# Patient Record
Sex: Female | Born: 1950 | Race: White | Hispanic: No | Marital: Married | Smoking: Current every day smoker
Health system: Southern US, Community
[De-identification: ages and names within clinical notes are randomized; demographics above are authoritative.]

## PROBLEM LIST (undated history)

## (undated) DIAGNOSIS — K589 Irritable bowel syndrome without diarrhea: Secondary | ICD-10-CM

## (undated) DIAGNOSIS — F32A Depression, unspecified: Secondary | ICD-10-CM

## (undated) DIAGNOSIS — T8859XA Other complications of anesthesia, initial encounter: Secondary | ICD-10-CM

## (undated) DIAGNOSIS — F329 Major depressive disorder, single episode, unspecified: Secondary | ICD-10-CM

## (undated) DIAGNOSIS — Z79890 Hormone replacement therapy (postmenopausal): Secondary | ICD-10-CM

## (undated) DIAGNOSIS — N950 Postmenopausal bleeding: Secondary | ICD-10-CM

## (undated) DIAGNOSIS — R87629 Unspecified abnormal cytological findings in specimens from vagina: Secondary | ICD-10-CM

## (undated) DIAGNOSIS — T4145XA Adverse effect of unspecified anesthetic, initial encounter: Secondary | ICD-10-CM

## (undated) DIAGNOSIS — K449 Diaphragmatic hernia without obstruction or gangrene: Secondary | ICD-10-CM

## (undated) DIAGNOSIS — R9389 Abnormal findings on diagnostic imaging of other specified body structures: Secondary | ICD-10-CM

## (undated) DIAGNOSIS — C4490 Unspecified malignant neoplasm of skin, unspecified: Secondary | ICD-10-CM

## (undated) HISTORY — DX: Postmenopausal bleeding: N95.0

## (undated) HISTORY — PX: TUBAL LIGATION: SHX77

## (undated) HISTORY — DX: Unspecified abnormal cytological findings in specimens from vagina: R87.629

## (undated) HISTORY — DX: Hormone replacement therapy: Z79.890

## (undated) HISTORY — DX: Unspecified malignant neoplasm of skin, unspecified: C44.90

## (undated) HISTORY — PX: BREAST ENHANCEMENT SURGERY: SHX7

## (undated) HISTORY — DX: Depression, unspecified: F32.A

## (undated) HISTORY — DX: Abnormal findings on diagnostic imaging of other specified body structures: R93.89

## (undated) HISTORY — DX: Major depressive disorder, single episode, unspecified: F32.9

## (undated) HISTORY — PX: CERVICAL BIOPSY: SHX590

---

## 2009-03-27 ENCOUNTER — Ambulatory Visit (HOSPITAL_COMMUNITY): Admission: RE | Admit: 2009-03-27 | Discharge: 2009-03-27 | Payer: Self-pay | Admitting: Internal Medicine

## 2009-07-08 ENCOUNTER — Other Ambulatory Visit: Admission: RE | Admit: 2009-07-08 | Discharge: 2009-07-08 | Payer: Self-pay | Admitting: Obstetrics and Gynecology

## 2009-09-19 HISTORY — PX: CHOLECYSTECTOMY: SHX55

## 2010-04-13 ENCOUNTER — Ambulatory Visit (HOSPITAL_COMMUNITY): Admission: RE | Admit: 2010-04-13 | Discharge: 2010-04-13 | Payer: Self-pay | Admitting: Obstetrics & Gynecology

## 2010-06-08 ENCOUNTER — Ambulatory Visit (HOSPITAL_COMMUNITY): Admission: RE | Admit: 2010-06-08 | Discharge: 2010-06-08 | Payer: Self-pay | Admitting: Internal Medicine

## 2010-06-18 ENCOUNTER — Ambulatory Visit (HOSPITAL_COMMUNITY): Admission: RE | Admit: 2010-06-18 | Discharge: 2010-06-18 | Payer: Self-pay | Admitting: General Surgery

## 2010-12-02 LAB — HEPATIC FUNCTION PANEL
ALT: 12 U/L (ref 0–35)
Total Bilirubin: 0.6 mg/dL (ref 0.3–1.2)
Total Protein: 6.7 g/dL (ref 6.0–8.3)

## 2010-12-02 LAB — BASIC METABOLIC PANEL
Calcium: 9.3 mg/dL (ref 8.4–10.5)
Chloride: 107 mEq/L (ref 96–112)
Creatinine, Ser: 0.95 mg/dL (ref 0.4–1.2)
GFR calc Af Amer: >60 mL/min (ref >60)
Glucose, Bld: 78 mg/dL (ref 70–99)
Sodium: 138 mEq/L (ref 135–145)

## 2010-12-02 LAB — CBC
Hemoglobin: 13.2 g/dL (ref 12.0–15.0)
MCHC: 33.9 g/dL (ref 30.0–36.0)
RBC: 4.56 MIL/uL (ref 3.87–5.11)
WBC: 6.8 10*3/uL (ref 4.0–10.5)

## 2010-12-02 LAB — SURGICAL PCR SCREEN: Staphylococcus aureus: NEGATIVE

## 2011-05-26 ENCOUNTER — Other Ambulatory Visit (HOSPITAL_COMMUNITY): Payer: Self-pay | Admitting: Internal Medicine

## 2011-05-26 DIAGNOSIS — Z139 Encounter for screening, unspecified: Secondary | ICD-10-CM

## 2011-06-06 ENCOUNTER — Ambulatory Visit (HOSPITAL_COMMUNITY)
Admission: RE | Admit: 2011-06-06 | Discharge: 2011-06-06 | Disposition: A | Discharge status: Home / Self Care | Source: Ambulatory Visit | Attending: Internal Medicine | Admitting: Internal Medicine

## 2011-06-06 DIAGNOSIS — Z139 Encounter for screening, unspecified: Secondary | ICD-10-CM

## 2011-06-06 DIAGNOSIS — Z1231 Encounter for screening mammogram for malignant neoplasm of breast: Secondary | ICD-10-CM | POA: Insufficient documentation

## 2011-07-13 ENCOUNTER — Other Ambulatory Visit: Payer: Self-pay | Admitting: Adult Health

## 2011-07-13 ENCOUNTER — Other Ambulatory Visit (HOSPITAL_COMMUNITY)
Admission: RE | Admit: 2011-07-13 | Discharge: 2011-07-13 | Disposition: A | Discharge status: Home / Self Care | Source: Ambulatory Visit | Attending: Obstetrics and Gynecology | Admitting: Obstetrics and Gynecology

## 2011-07-13 DIAGNOSIS — Z01419 Encounter for gynecological examination (general) (routine) without abnormal findings: Secondary | ICD-10-CM | POA: Insufficient documentation

## 2013-04-12 ENCOUNTER — Encounter (INDEPENDENT_AMBULATORY_CARE_PROVIDER_SITE_OTHER): Payer: Self-pay | Admitting: *Deleted

## 2013-04-15 ENCOUNTER — Ambulatory Visit (INDEPENDENT_AMBULATORY_CARE_PROVIDER_SITE_OTHER): Admitting: Internal Medicine

## 2013-04-15 ENCOUNTER — Encounter (INDEPENDENT_AMBULATORY_CARE_PROVIDER_SITE_OTHER): Payer: Self-pay | Admitting: Internal Medicine

## 2013-04-15 VITALS — BP 94/60 | HR 84 | Temp 99.7°F | Ht 64.0 in | Wt 118.6 lb

## 2013-04-15 DIAGNOSIS — R634 Abnormal weight loss: Secondary | ICD-10-CM | POA: Insufficient documentation

## 2013-04-15 DIAGNOSIS — C4490 Unspecified malignant neoplasm of skin, unspecified: Secondary | ICD-10-CM | POA: Insufficient documentation

## 2013-04-15 DIAGNOSIS — K529 Noninfective gastroenteritis and colitis, unspecified: Secondary | ICD-10-CM | POA: Insufficient documentation

## 2013-04-15 DIAGNOSIS — R197 Diarrhea, unspecified: Secondary | ICD-10-CM

## 2013-04-15 NOTE — Progress Notes (Signed)
Subjective:     Patient ID: Morgan Fields, female   DOB: Feb 08, 1951, 62 y.o.   MRN: [id number]  HPI Referred to our office by Dr. Timothy Lasso for weight loss and severe diarrhea.  She tells me her diarrhea with the past 2 years. No studies studies. She has diarrhea 6-8 times in the morning. Until she was put on Lomotil, her stools were loose. Now her stools are soft and formed. She has had 5 stools this morning. Appetite is not good. She has nausea with a meal. She tells me she has lost 30 pounds over the past 2 yrs. She has lost 10 pounds over the past 6 months. She occasionally has rt upper quadrant pain.  Presently taking ? Antibiotic for a biopsy on posterior rt foot No melena or rectal bleeding Grandmother died of colon cancer at age 76.   Colonoscopy 06/29/2005 Dr. Cleotis Nipper;  No diverticulosis. No colonic or rectal polyps. Reductant colon  EGD/913/2011 Dr. Karilyn Cota:  (biopsy of a swollen antral fold). Chronic gastritis and edema. 06/08/2010 US abdomen: IMPRESSION:  1. Cholelithiasis without sonographic findings for acute  cholecystitis.  2. Normal caliber common bile duct.  3. The remainder of the examination is unremarkable.   06/18/2010: Lap. Cholecystectomy Her last colonoscopy was 8 yrs ago by Dr. Cleotis Nipper in [place]. Normal exam.   Review of Systems     Objective:   Physical Exam  Filed Vitals:   04/15/13 0925  BP: 94/60  Pulse: 84  Temp: 99.7 F (37.6 C)  Height: 5\' 4"  (1.626 m)  Weight: 118 lb 9.6 oz (53.797 kg)   Alert and oriented. Skin warm and dry. Oral mucosa is moist.   . Sclera anicteric, conjunctivae is pink. Thyroid not enlarged. No cervical lymphadenopathy. Lungs clear. Heart regular rate and rhythm.  Abdomen is soft. Bowel sounds are positive. No hepatomegaly. No abdominal masses felt. No tenderness.  No edema to lower extremities. Patient is alert and oriented.      Assessment:    Diarrhea. ? Post gallbladder surgery. Possible IBS  Weight loss. Colonic  neoplasm needs to be ruled. Last colonoscopy 8 yrs ago by Dr. Cleotis Nipper which was normal.    Plan:    Colonoscopy by Dr. Karilyn Cota. Imodium BID

## 2013-04-15 NOTE — Patient Instructions (Addendum)
Colonoscopy with Dr. Karilyn Cota. Imodium BID

## 2013-04-17 ENCOUNTER — Other Ambulatory Visit (INDEPENDENT_AMBULATORY_CARE_PROVIDER_SITE_OTHER): Payer: Self-pay | Admitting: *Deleted

## 2013-04-17 ENCOUNTER — Telehealth (INDEPENDENT_AMBULATORY_CARE_PROVIDER_SITE_OTHER): Payer: Self-pay | Admitting: *Deleted

## 2013-04-17 DIAGNOSIS — R197 Diarrhea, unspecified: Secondary | ICD-10-CM

## 2013-04-17 DIAGNOSIS — Z1211 Encounter for screening for malignant neoplasm of colon: Secondary | ICD-10-CM

## 2013-04-17 DIAGNOSIS — R634 Abnormal weight loss: Secondary | ICD-10-CM

## 2013-04-17 NOTE — Telephone Encounter (Signed)
Patient needs movi prep 

## 2013-04-19 MED ORDER — PEG-KCL-NACL-NASULF-NA ASC-C 100 G PO SOLR: 1.0000 | Freq: Once | ORAL | Status: DC

## 2013-04-22 ENCOUNTER — Encounter (HOSPITAL_COMMUNITY): Payer: Self-pay | Admitting: Pharmacy Technician

## 2013-05-06 ENCOUNTER — Encounter (INDEPENDENT_AMBULATORY_CARE_PROVIDER_SITE_OTHER): Payer: Self-pay

## 2013-05-08 ENCOUNTER — Encounter (HOSPITAL_COMMUNITY): Payer: Self-pay | Admitting: *Deleted

## 2013-05-08 ENCOUNTER — Encounter (HOSPITAL_COMMUNITY)
Admission: RE | Disposition: A | Discharge status: Home / Self Care | Payer: Self-pay | Source: Ambulatory Visit | Attending: Internal Medicine

## 2013-05-08 ENCOUNTER — Ambulatory Visit (HOSPITAL_COMMUNITY)
Admission: RE | Admit: 2013-05-08 | Discharge: 2013-05-08 | Disposition: A | Discharge status: Home / Self Care | Source: Ambulatory Visit | Attending: Internal Medicine | Admitting: Internal Medicine

## 2013-05-08 DIAGNOSIS — K589 Irritable bowel syndrome without diarrhea: Secondary | ICD-10-CM | POA: Insufficient documentation

## 2013-05-08 DIAGNOSIS — K5730 Diverticulosis of large intestine without perforation or abscess without bleeding: Secondary | ICD-10-CM

## 2013-05-08 DIAGNOSIS — R197 Diarrhea, unspecified: Secondary | ICD-10-CM

## 2013-05-08 DIAGNOSIS — Z85828 Personal history of other malignant neoplasm of skin: Secondary | ICD-10-CM | POA: Insufficient documentation

## 2013-05-08 DIAGNOSIS — R634 Abnormal weight loss: Secondary | ICD-10-CM

## 2013-05-08 DIAGNOSIS — Z800 Family history of malignant neoplasm of digestive organs: Secondary | ICD-10-CM | POA: Insufficient documentation

## 2013-05-08 HISTORY — PX: COLONOSCOPY: SHX5424

## 2013-05-08 SURGERY — COLONOSCOPY
Anesthesia: Moderate Sedation

## 2013-05-08 MED ORDER — MEPERIDINE HCL 50 MG/ML IJ SOLN
INTRAMUSCULAR | Status: AC
  Filled 2013-05-08: qty 1

## 2013-05-08 MED ORDER — MEPERIDINE HCL 50 MG/ML IJ SOLN
INTRAMUSCULAR | Status: DC | PRN
  Administered 2013-05-08: 10 mg via INTRAVENOUS
  Administered 2013-05-08: 25 mg via INTRAVENOUS
  Administered 2013-05-08: 15 mg via INTRAVENOUS

## 2013-05-08 MED ORDER — MIDAZOLAM HCL 5 MG/5ML IJ SOLN
INTRAMUSCULAR | Status: AC
  Filled 2013-05-08: qty 10

## 2013-05-08 MED ORDER — DICYCLOMINE HCL 10 MG PO CAPS: 10.0000 mg | ORAL_CAPSULE | Freq: Three times a day (TID) | ORAL | Status: DC

## 2013-05-08 MED ORDER — MIDAZOLAM HCL 5 MG/5ML IJ SOLN
INTRAMUSCULAR | Status: DC | PRN
  Administered 2013-05-08: 1 mg via INTRAVENOUS
  Administered 2013-05-08: 2 mg via INTRAVENOUS
  Administered 2013-05-08: 1 mg via INTRAVENOUS
  Administered 2013-05-08 (×2): 2 mg via INTRAVENOUS

## 2013-05-08 MED ORDER — SODIUM CHLORIDE 0.9 % IV SOLN
INTRAVENOUS | Status: DC
  Administered 2013-05-08: 1000 mL via INTRAVENOUS

## 2013-05-08 NOTE — Op Note (Addendum)
COLONOSCOPY PROCEDURE REPORT  PATIENT:  Morgan Fields  MR#:  [id number] Birthdate:  Dec 19, 1950, 62 y.o., female Endoscopist:  Dr. Malissa Hippo, MD Referred By:  Dr. Dwana Melena, MD Procedure Date: 05/08/2013  Procedure:   Colonoscopy  Indications:  Patient is 63 year-old Caucasian female who  presents with chronic diarrhea and 15 pound weight loss.He has history of IBS or present symptoms are different than what she used to have.  Informed Consent:  The procedure and risks were reviewed with the patient and informed consent was obtained.  Medications:  Demerol 50 mg IV Versed 8 mg IV  Description of procedure:  After a digital rectal exam was performed, that colonoscope was advanced from the anus through the rectum and colon to the area of the cecum, ileocecal valve and appendiceal orifice. The cecum was deeply intubated. These structures were well-seen and photographed for the record. From the level of the cecum and ileocecal valve, the scope was slowly and cautiously withdrawn. The mucosal surfaces were carefully surveyed utilizing scope tip to flexion to facilitate fold flattening as needed. The scope was pulled down into the rectum where a thorough exam including retroflexion was performed. Terminal ileum was also examined.  Findings:  Prep excellent. Normal mucosa of terminal ileum. Few small diverticula at sigmoid colon. Mucosa of varius egments of colon was normal. Normal rectal mucosa and anal rectal junction. Random biopsies taken for mucosa of sigmoid colon for routine histology.    Therapeutic/Diagnostic Maneuvers Performed:  See above  Complications:  None  Cecal Withdrawal Time:  9 minutes  Impression:  Normal mucosa of terminal ileum. Few small diverticula at sigmoid colon otherwise normal colonoscopy. Random biopsies taken for mucosa of sigmoid colon looking for microscopic or collagenous colitis.  Recommendations:  Standard instructions  given. Dicyclomine 10 mg by mouth a.c. I will contact patient with biopsy results and further recommendations.  Sheron Tallman U  05/08/2013 3:32 PM  CC: Dr. Catalina Pizza, MD & Dr. Bonnetta Barry ref. provider found

## 2013-05-08 NOTE — H&P (Signed)
Morgan Fields is an 62 y.o. female.   Chief Complaint: Patient's here for colonoscopy. HPI: Patient is 40-year-old Caucasian female who presents with 6 month history of diarrhea. On worst that she is having as many as 8-10 stools per day. She had explosive bowel movements and urgency. She denies abdominal pain or rectal bleeding. She also has noted loss of appetite nausea and 15 pound weight loss in the last 6 months. She has history of IBS he has been doing well without any therapy until these symptoms started. Since last colonoscopy was in 2006. History is significant for colon carcinoma in maternal grandmother who was in her 96s at the time of diagnosis.  Past Medical History  Diagnosis Date  . Skin cancer     Recent to anterior chest.     Past Surgical History  Procedure Laterality Date  . Cholecystectomy  2011  . Tubal ligation    . Cervical biopsy      precancerous years ago  . Breast enhancement surgery      History reviewed. No pertinent family history. Social History:  reports that she has been smoking.  She does not have any smokeless tobacco history on file. She reports that  drinks alcohol. She reports that she does not use illicit drugs.  Allergies: No Known Allergies  Medications Prior to Admission  Medication Sig Dispense Refill  . Loperamide HCl (IMODIUM A-D) 1 MG/7.5ML LIQD Take 15 mLs by mouth daily as needed (diarrhea).      . Probiotic Product (ALIGN) 4 MG CAPS Take by mouth.        No results found for this or any previous visit (from the past 48 hour(s)). No results found.  ROS  Blood pressure 131/74, pulse 82, temperature 98.5 F (36.9 C), temperature source Oral, resp. rate 17, height 5\' 4"  (1.626 m), weight 117 lb (53.071 kg), SpO2 94.00%. Physical Exam  Constitutional:  Well-developed thin Caucasian female in NAD  HENT:  Mouth/Throat: Oropharynx is clear and moist.  Eyes: Conjunctivae are normal. No scleral icterus.  Neck: No thyromegaly  present.  Cardiovascular: Normal rate, regular rhythm and normal heart sounds.   No murmur heard. Respiratory: Effort normal and breath sounds normal.  GI: Soft. She exhibits no distension and no mass. There is no tenderness.  Musculoskeletal: She exhibits no edema.  Lymphadenopathy:    She has no cervical adenopathy.  Neurological: She is alert.  Skin: Skin is warm and dry.  She has tattoo over right scapular area     Assessment/Plan Chronic diarrhea and weight loss. Diagnostic colonoscopy.  Nyheem Binette U 05/08/2013, 2:49 PM

## 2013-05-09 ENCOUNTER — Encounter (HOSPITAL_COMMUNITY): Payer: Self-pay | Admitting: Internal Medicine

## 2013-05-27 ENCOUNTER — Encounter (INDEPENDENT_AMBULATORY_CARE_PROVIDER_SITE_OTHER): Payer: Self-pay | Admitting: *Deleted

## 2013-05-27 ENCOUNTER — Telehealth (INDEPENDENT_AMBULATORY_CARE_PROVIDER_SITE_OTHER): Payer: Self-pay | Admitting: *Deleted

## 2013-05-27 DIAGNOSIS — R197 Diarrhea, unspecified: Secondary | ICD-10-CM

## 2013-05-27 DIAGNOSIS — R634 Abnormal weight loss: Secondary | ICD-10-CM

## 2013-05-27 NOTE — Telephone Encounter (Signed)
Per Dr.Rehman the patient will need to have labs drawn. 

## 2013-07-02 ENCOUNTER — Other Ambulatory Visit (HOSPITAL_COMMUNITY): Payer: Self-pay | Admitting: Internal Medicine

## 2013-07-02 DIAGNOSIS — Z139 Encounter for screening, unspecified: Secondary | ICD-10-CM

## 2013-07-09 ENCOUNTER — Other Ambulatory Visit (HOSPITAL_COMMUNITY): Payer: Self-pay | Admitting: Internal Medicine

## 2013-07-09 ENCOUNTER — Ambulatory Visit (HOSPITAL_COMMUNITY)
Admission: RE | Admit: 2013-07-09 | Discharge: 2013-07-09 | Disposition: A | Discharge status: Home / Self Care | Source: Ambulatory Visit | Attending: Internal Medicine | Admitting: Internal Medicine

## 2013-07-09 DIAGNOSIS — Z139 Encounter for screening, unspecified: Secondary | ICD-10-CM

## 2013-07-09 DIAGNOSIS — Z1231 Encounter for screening mammogram for malignant neoplasm of breast: Secondary | ICD-10-CM | POA: Insufficient documentation

## 2013-10-30 ENCOUNTER — Other Ambulatory Visit (HOSPITAL_COMMUNITY): Payer: Self-pay | Admitting: Internal Medicine

## 2013-10-30 ENCOUNTER — Ambulatory Visit (HOSPITAL_COMMUNITY)
Admission: RE | Admit: 2013-10-30 | Discharge: 2013-10-30 | Disposition: A | Discharge status: Home / Self Care | Source: Ambulatory Visit | Attending: Internal Medicine | Admitting: Internal Medicine

## 2013-10-30 DIAGNOSIS — Z87891 Personal history of nicotine dependence: Secondary | ICD-10-CM

## 2013-10-30 DIAGNOSIS — F17200 Nicotine dependence, unspecified, uncomplicated: Secondary | ICD-10-CM | POA: Insufficient documentation

## 2013-10-30 DIAGNOSIS — N644 Mastodynia: Secondary | ICD-10-CM

## 2013-10-30 DIAGNOSIS — R634 Abnormal weight loss: Secondary | ICD-10-CM

## 2013-10-30 DIAGNOSIS — R079 Chest pain, unspecified: Secondary | ICD-10-CM | POA: Insufficient documentation

## 2013-10-30 DIAGNOSIS — M47814 Spondylosis without myelopathy or radiculopathy, thoracic region: Secondary | ICD-10-CM | POA: Insufficient documentation

## 2014-08-06 ENCOUNTER — Other Ambulatory Visit (HOSPITAL_COMMUNITY): Payer: Self-pay | Admitting: Internal Medicine

## 2014-08-06 DIAGNOSIS — Z1231 Encounter for screening mammogram for malignant neoplasm of breast: Secondary | ICD-10-CM

## 2014-08-10 ENCOUNTER — Emergency Department (HOSPITAL_COMMUNITY)
Admission: EM | Admit: 2014-08-10 | Discharge: 2014-08-10 | Disposition: A | Discharge status: Home / Self Care | Payer: TRICARE For Life (TFL) | Attending: Emergency Medicine | Admitting: Emergency Medicine

## 2014-08-10 ENCOUNTER — Encounter (HOSPITAL_COMMUNITY): Payer: Self-pay | Admitting: Emergency Medicine

## 2014-08-10 DIAGNOSIS — H01003 Unspecified blepharitis right eye, unspecified eyelid: Secondary | ICD-10-CM | POA: Diagnosis not present

## 2014-08-10 DIAGNOSIS — Z792 Long term (current) use of antibiotics: Secondary | ICD-10-CM | POA: Diagnosis not present

## 2014-08-10 DIAGNOSIS — H00011 Hordeolum externum right upper eyelid: Secondary | ICD-10-CM | POA: Diagnosis not present

## 2014-08-10 DIAGNOSIS — H539 Unspecified visual disturbance: Secondary | ICD-10-CM | POA: Diagnosis present

## 2014-08-10 DIAGNOSIS — H00013 Hordeolum externum right eye, unspecified eyelid: Secondary | ICD-10-CM

## 2014-08-10 MED ORDER — KETOROLAC TROMETHAMINE 0.5 % OP SOLN
1.0000 [drp] | Freq: Once | OPHTHALMIC | Status: AC
  Administered 2014-08-10: 1 [drp] via OPHTHALMIC
  Filled 2014-08-10: qty 5

## 2014-08-10 MED ORDER — ERYTHROMYCIN 5 MG/GM OP OINT
TOPICAL_OINTMENT | Freq: Once | OPHTHALMIC | Status: AC
  Administered 2014-08-10: 19:00:00 via OPHTHALMIC
  Filled 2014-08-10: qty 3.5

## 2014-08-10 NOTE — ED Notes (Signed)
Patient reports feeling irritation to right eyelid Monday that progressively got worse. Patient now has redness and swelling to eyelid. Reports using warm and cold compresses with no relief. Patient also using over counter eye drops with only a little relief. Patient does report clear drainage. Denies any blurred vision.

## 2014-08-10 NOTE — ED Provider Notes (Signed)
CSN: [id number]     Arrival date & time 08/10/14  1723 History  This chart was scribed for Morgan Jefferson, PA-C with Maudry Diego, MD by Edison Simon, ED Scribe. This patient was seen in room APFT21/[facility] and the patient's care was started at 6:22 PM.    Chief Complaint  Patient presents with  . Belepharitis   The history is provided by the patient. No language interpreter was used.    HPI Comments: Morgan Fields is a 63 y.o. female who presents to the Emergency Department complaining of right upper eyelid irritation, redness, and swelling with gradual onset since 6 days ago. She states she was at yoga when she stood up and started to feel a "heaviness" to her right eye. She states she saw her PCP 4 days ago who thought she had pink eye and prescribed her Naphcon drops; she states the drops make it feel better momentarily, but then has recurrent pressure and pain. She also reports using warm compresses, the switched to cold compresses after which the swelling enlarged. She states swelling and pain continued to increase and spread. She reports some blurry vision. She denies redness in the eye itself. She has woke with crusty eyelid margins and difficulty opening her eye.  Past Medical History  Diagnosis Date  . Skin cancer     Recent to anterior chest.    Past Surgical History  Procedure Laterality Date  . Cholecystectomy  2011  . Tubal ligation    . Cervical biopsy      precancerous years ago  . Breast enhancement surgery    . Colonoscopy N/A 05/08/2013    Procedure: COLONOSCOPY;  Surgeon: Rogene Houston, MD;  Location: [facility];  Service: Endoscopy;  Laterality: N/A;  200   Family History  Problem Relation Age of Onset  . Diabetes Other   . Heart attack Brother    History  Substance Use Topics  . Smoking status: Current Every Day Smoker -- 0.10 packs/day for 24 years    Types: Cigarettes  . Smokeless tobacco: Never Used  . Alcohol Use: 7.2 oz/week    12 Cans of beer  per week   OB History    Gravida Para Term Preterm AB TAB SAB Ectopic Multiple Living   3 2 1 1 1  1   2      Review of Systems  Constitutional: Negative for fever.  HENT: Negative for congestion and sore throat.   Eyes: Positive for visual disturbance. Negative for redness.       Eyelid pain, swelling, and erythema  Respiratory: Negative for chest tightness and shortness of breath.   Cardiovascular: Negative for chest pain.  Gastrointestinal: Negative for nausea and abdominal pain.  Genitourinary: Negative.   Musculoskeletal: Negative for joint swelling, arthralgias and neck pain.  Skin: Negative.  Negative for rash and wound.  Neurological: Negative for dizziness, weakness, light-headedness, numbness and headaches.  Psychiatric/Behavioral: Negative.       Allergies  Review of patient's allergies indicates no known allergies.  Home Medications   Prior to Admission medications   Medication Sig Start Date End Date Taking? Authorizing Provider  dicyclomine (BENTYL) 10 MG capsule Take 1 capsule (10 mg total) by mouth 3 (three) times daily before meals. 05/08/13  Yes Rogene Houston, MD  Probiotic Product (ALIGN) 4 MG CAPS Take by mouth.   Yes Historical Provider, MD  Loperamide HCl (IMODIUM A-D) 1 MG/7.5ML LIQD Take 15 mLs by mouth daily as needed (diarrhea).  Historical Provider, MD   BP 148/78 mmHg  Pulse 89  Temp(Src) 98.9 F (37.2 C)  Resp 16  Ht 5\' 4"  (1.626 m)  Wt 119 lb (53.978 kg)  BMI 20.42 kg/m2  SpO2 99% Physical Exam  Constitutional: She appears well-developed and well-nourished.  HENT:  Head: Normocephalic and atraumatic.  Eyes: Conjunctivae and EOM are normal. Pupils are equal, round, and reactive to light.  Right upper right eyelid stye along her eyelash edge with mild upper eyelid edema and erythema No pointing of the stye There is a trace of watery yellow discharge in the medial and lateral canthi No conjunctival injection  Bilateral Near: 20/50 ;  Bilateral Distance: 20/50  ; R Near: 20/50 ; R Distance:  20/50 ; L Near: 20/50 ; L Distance: 20/50  Neck: Normal range of motion.  Cardiovascular: Normal rate, regular rhythm, normal heart sounds and intact distal pulses.   Pulmonary/Chest: Effort normal and breath sounds normal. She has no wheezes.  Abdominal: Soft. Bowel sounds are normal. There is no tenderness.  Musculoskeletal: Normal range of motion.  Neurological: She is alert.  Skin: Skin is warm and dry.  Psychiatric: She has a normal mood and affect.  Nursing note and vitals reviewed.   ED Course  Procedures (including critical care time)  DIAGNOSTIC STUDIES: Oxygen Saturation is 99% on room air, normal by my interpretation.    COORDINATION OF CARE: 6:36 PM Discussed treatment plan with patient at beside, the patient agrees with the plan and has no further questions at this time.   Labs Review Labs Reviewed - No data to display  Imaging Review No results found.   EKG Interpretation None      MDM   Final diagnoses:  Stye external, right  Blepharitis of right eye    Encouraged continued warm compresses, wash eyelids bid with baby shampoo, given erythromycin ointment to apply to eye and lid qid,  Ketorolac gtts for inflammation.  Referral to opthalmology prn if not starting to improve or for any worsened sx over the next 48 hours.    I personally performed the services described in this documentation, which was scribed in my presence. The recorded information has been reviewed and is accurate.   Morgan Jefferson, PA-C 08/10/14 2105  Maudry Diego, MD 08/10/14 [phone]

## 2014-08-10 NOTE — Discharge Instructions (Signed)
Blepharitis Blepharitis is redness, soreness, and swelling (inflammation) of one or both eyelids. It may be caused by an allergic reaction or a bacterial infection. Blepharitis may also be associated with reddened, scaly skin (seborrhea) of the scalp and eyebrows. While you sleep, eye discharge may cause your eyelashes to stick together. Your eyelids may itch, burn, swell, and may lose their lashes. These will grow back. Your eyes may become sensitive. Blepharitis may recur and need repeated treatment. If this is the case, you may require further evaluation by an eye specialist (ophthalmologist). HOME CARE INSTRUCTIONS   Keep your hands clean.  Use a clean towel each time you dry your eyelids. Do not use this towel to clean other areas. Do not share a towel or makeup with anyone.  Wash your eyelids with warm water or warm water mixed with a small amount of baby shampoo. Do this twice a day or as often as needed.  Wash your face and eyebrows at least once a day.  Use warm compresses 2 times a day for 10 minutes at a time, or as directed by your caregiver.  Apply antibiotic ointment as directed by your caregiver.  Avoid rubbing your eyes.  Avoid wearing makeup until you get better.  Follow up with your caregiver as directed. SEEK IMMEDIATE MEDICAL CARE IF:   You have pain, redness, or swelling that gets worse or spreads to other parts of your face.  Your vision changes, or you have pain when looking at lights or moving objects.  You have a fever.  Your symptoms continue for longer than 2 to 4 days or become worse. MAKE SURE YOU:   Understand these instructions.  Will watch your condition.  Will get help right away if you are not doing well or get worse. Document Released: 09/02/2000 Document Revised: 11/28/2011 Document Reviewed: 10/13/2010 [facility] Patient Information 2015 [place], [place]. This information is not intended to replace advice given to you by your health care  provider. Make sure you discuss any questions you have with your health care provider.   Apply the antibiotic ointment given 4 times daily to both your right eye and your eyelid as discussed.  You may also apply 1 drop of the ketorolac drop 4 times daily which can help with pain and inflammation.  Warm compresses as discussed.  Recheck by your doctor or the opthalmologist listed above if this treatment does not start to improve your symptoms over the next 48 hours.

## 2014-08-18 ENCOUNTER — Ambulatory Visit (HOSPITAL_COMMUNITY)
Admission: RE | Admit: 2014-08-18 | Discharge: 2014-08-18 | Disposition: A | Discharge status: Home / Self Care | Source: Ambulatory Visit | Attending: Internal Medicine | Admitting: Internal Medicine

## 2014-08-18 DIAGNOSIS — Z1231 Encounter for screening mammogram for malignant neoplasm of breast: Secondary | ICD-10-CM | POA: Diagnosis not present

## 2015-10-29 ENCOUNTER — Other Ambulatory Visit (HOSPITAL_COMMUNITY): Payer: Self-pay | Admitting: Internal Medicine

## 2015-10-29 DIAGNOSIS — Z1231 Encounter for screening mammogram for malignant neoplasm of breast: Secondary | ICD-10-CM

## 2015-10-29 DIAGNOSIS — M8580 Other specified disorders of bone density and structure, unspecified site: Secondary | ICD-10-CM

## 2015-11-09 ENCOUNTER — Other Ambulatory Visit (HOSPITAL_COMMUNITY)

## 2015-11-09 ENCOUNTER — Ambulatory Visit (HOSPITAL_COMMUNITY)

## 2015-11-16 ENCOUNTER — Other Ambulatory Visit (HOSPITAL_COMMUNITY)

## 2015-11-16 ENCOUNTER — Ambulatory Visit (HOSPITAL_COMMUNITY)

## 2015-11-20 ENCOUNTER — Ambulatory Visit (HOSPITAL_COMMUNITY)

## 2015-11-20 ENCOUNTER — Other Ambulatory Visit (HOSPITAL_COMMUNITY): Payer: Self-pay | Admitting: Internal Medicine

## 2015-11-20 ENCOUNTER — Ambulatory Visit

## 2015-11-20 ENCOUNTER — Ambulatory Visit (HOSPITAL_COMMUNITY)
Admission: RE | Admit: 2015-11-20 | Discharge: 2015-11-20 | Disposition: A | Discharge status: Home / Self Care | Source: Ambulatory Visit | Attending: Internal Medicine | Admitting: Internal Medicine

## 2015-11-20 DIAGNOSIS — M8580 Other specified disorders of bone density and structure, unspecified site: Secondary | ICD-10-CM | POA: Insufficient documentation

## 2015-11-20 DIAGNOSIS — Z1231 Encounter for screening mammogram for malignant neoplasm of breast: Secondary | ICD-10-CM

## 2015-11-20 DIAGNOSIS — E559 Vitamin D deficiency, unspecified: Secondary | ICD-10-CM | POA: Diagnosis present

## 2016-03-11 ENCOUNTER — Encounter: Payer: Self-pay | Admitting: Adult Health

## 2016-03-11 ENCOUNTER — Ambulatory Visit (INDEPENDENT_AMBULATORY_CARE_PROVIDER_SITE_OTHER): Payer: TRICARE For Life (TFL) | Admitting: Adult Health

## 2016-03-11 VITALS — BP 100/70 | HR 82 | Ht 63.0 in | Wt 121.0 lb

## 2016-03-11 DIAGNOSIS — Z79890 Hormone replacement therapy (postmenopausal): Secondary | ICD-10-CM | POA: Diagnosis not present

## 2016-03-11 DIAGNOSIS — N950 Postmenopausal bleeding: Secondary | ICD-10-CM | POA: Diagnosis not present

## 2016-03-11 HISTORY — DX: Hormone replacement therapy: Z79.890

## 2016-03-11 HISTORY — DX: Postmenopausal bleeding: N95.0

## 2016-03-11 NOTE — Progress Notes (Signed)
Subjective:     Patient ID: Morgan Fields, female   DOB: 1951/07/22, 65 y.o.   MRN: [id number]  HPI Morgan Fields is a 65 year old white female, married in for having postmenopausal bleeding,her last period was around age 65.She started HRT 12/23/15 for body pain, feeling exhausted, by Dr Anastasio Champion, and she feels much better.  Review of Systems Patient denies any headaches, hearing loss, fatigue, blurred vision, shortness of breath, chest pain, abdominal pain, problems with bowel movements, urination, or intercourse. No joint pain or mood swings.+PMB Reviewed past medical,surgical, social and family history. Reviewed medications and allergies.     Objective:   Physical Exam BP 100/70 mmHg  Pulse 82  Ht 5\' 3"  (1.6 m)  Wt 121 lb (54.885 kg)  BMI 21.44 kg/m2 Skin warm and dry.Pelvic: external genitalia is normal in appearance no lesions, vagina: period like blood,urethra has no lesions or masses noted, cervix:smooth and bulbous, uterus: normal size, shape and contour, non tender, no masses felt, adnexa: no masses or tenderness noted. Bladder is non tender and no masses felt. Abdomen is soft and non tender.Discussed will get [place] to assess endometrial lining, if if normal will continue HRT and if thickened will need biopsy. Face time 20 minutes with 50% counseling.    Assessment:     PMB PM HRT    Plan:     Return in 1 week for gyn [place] and see me to discuss.

## 2016-03-11 NOTE — Patient Instructions (Signed)
Continue HRT Return next week for gyn [place] and see me

## 2016-03-16 ENCOUNTER — Encounter: Payer: Self-pay | Admitting: Adult Health

## 2016-03-16 ENCOUNTER — Ambulatory Visit (INDEPENDENT_AMBULATORY_CARE_PROVIDER_SITE_OTHER): Payer: TRICARE For Life (TFL) | Admitting: Adult Health

## 2016-03-16 ENCOUNTER — Ambulatory Visit (INDEPENDENT_AMBULATORY_CARE_PROVIDER_SITE_OTHER): Payer: TRICARE For Life (TFL)

## 2016-03-16 VITALS — BP 96/64 | HR 72 | Ht 63.0 in | Wt 120.5 lb

## 2016-03-16 DIAGNOSIS — N854 Malposition of uterus: Secondary | ICD-10-CM

## 2016-03-16 DIAGNOSIS — R938 Abnormal findings on diagnostic imaging of other specified body structures: Secondary | ICD-10-CM | POA: Diagnosis not present

## 2016-03-16 DIAGNOSIS — R9389 Abnormal findings on diagnostic imaging of other specified body structures: Secondary | ICD-10-CM | POA: Insufficient documentation

## 2016-03-16 DIAGNOSIS — Z79890 Hormone replacement therapy (postmenopausal): Secondary | ICD-10-CM | POA: Diagnosis not present

## 2016-03-16 DIAGNOSIS — N950 Postmenopausal bleeding: Secondary | ICD-10-CM | POA: Diagnosis not present

## 2016-03-16 DIAGNOSIS — D259 Leiomyoma of uterus, unspecified: Secondary | ICD-10-CM

## 2016-03-16 DIAGNOSIS — D252 Subserosal leiomyoma of uterus: Secondary | ICD-10-CM | POA: Diagnosis not present

## 2016-03-16 HISTORY — DX: Abnormal findings on diagnostic imaging of other specified body structures: R93.89

## 2016-03-16 NOTE — Patient Instructions (Signed)
Return next week for endo biopsy

## 2016-03-16 NOTE — Progress Notes (Signed)
Subjective:     Patient ID: Morgan Fields, female   DOB: 07-16-1951, 65 y.o.   MRN: [id number]  HPI Morgan Fields is a 65 year old white female in for [place] to assess PMB, started HRT 12/23/15.  Review of Systems  +PMB  +HRT Reviewed past medical,surgical, social and family history. Reviewed medications and allergies.     Objective:   Physical Exam BP 96/64 mmHg  Pulse 72  Ht 5\' 3"  (1.6 m)  Wt 120 lb 8 oz (54.658 kg)  BMI 21.35 kg/m2Reviewed [place] results:   Uterus 8.5 x 4.2 x 3.5 cm, heterogenous anteverted uterus w/a subserosal calcified fundal fibroid 2.4 x 2.2 x 2.8 cm  Endometrium 11.7 mm, symmetrical, thickened (no color flow seen)  Right ovary 2.5 x 1 x 2.0 cm, wnl  Left ovary 2.1 x 1.6 x 1 cm, wnl  No free fluid,no pain during ultrasound   Technician Comments:  PELVIC US TA/TV: heterogenous anteverted uterus w/a subserosal calcified fundal fibroid 2.4 x 2.2 x 2.8 cm,normal ov's bilat (mobile),thickened endometrium 11.7 mm NO color flow seen,no free fluid,no pain during ultrasound  Will get endometrial biopsy due to thickened endometrium.  Assessment:     Thickened endometrial lining PMB HRT     Plan:     Return in 1 week for endo biopsy with Dr Elonda Husky Review handout on endo biopsy

## 2016-03-16 NOTE — Progress Notes (Signed)
PELVIC US TA/TV: heterogenous anteverted uterus w/a subserosal calcified fundal fibroid 2.4 x 2.2 x 2.8 cm,normal ov's bilat (mobile),thickened endometrium 11.7 mm NO color flow seen,no free fluid,no pain during ultrasound

## 2016-03-25 ENCOUNTER — Ambulatory Visit (INDEPENDENT_AMBULATORY_CARE_PROVIDER_SITE_OTHER): Payer: TRICARE For Life (TFL) | Admitting: Obstetrics & Gynecology

## 2016-03-25 ENCOUNTER — Encounter: Payer: Self-pay | Admitting: Obstetrics & Gynecology

## 2016-03-25 ENCOUNTER — Other Ambulatory Visit: Payer: Self-pay | Admitting: Obstetrics & Gynecology

## 2016-03-25 VITALS — BP 110/70 | HR 72 | Wt 121.0 lb

## 2016-03-25 DIAGNOSIS — N840 Polyp of corpus uteri: Secondary | ICD-10-CM | POA: Diagnosis not present

## 2016-03-25 DIAGNOSIS — R938 Abnormal findings on diagnostic imaging of other specified body structures: Secondary | ICD-10-CM | POA: Diagnosis not present

## 2016-03-25 DIAGNOSIS — N950 Postmenopausal bleeding: Secondary | ICD-10-CM

## 2016-03-25 DIAGNOSIS — R9389 Abnormal findings on diagnostic imaging of other specified body structures: Secondary | ICD-10-CM

## 2016-03-25 NOTE — Progress Notes (Signed)
Patient ID: Morgan Fields, female   DOB: 04/07/1951, 65 y.o.   MRN: [id number] Endometrial Biopsy Procedure Note  Pre-operative Diagnosis: postmenopausal bleeding with thickened endometrial stripe  Post-operative Diagnosis: same  Indications: postmenopausal bleeding  Procedure Details   Urine pregnancy test was not done.  The risks (including infection, bleeding, pain, and uterine perforation) and benefits of the procedure were explained to the patient and Written informed consent was obtained.  Antibiotic prophylaxis against endocarditis was not indicated.   The patient was placed in the dorsal lithotomy position.  Bimanual exam showed the uterus to be in the neutral position.  A Graves' speculum inserted in the vagina, and the cervix prepped with povidone iodine.  Endocervical curettage with a Kevorkian curette was not performed.   A sharp tenaculum was applied to the anterior lip of the cervix for stabilization.  A sterile uterine sound was used to sound the uterus to a depth of 5.5cm.  A pipelle aspirator was used to sample the endometrium.  Sample was sent for pathologic examination.  Condition: Stable  Complications: None  Plan:  The patient was advised to call for any fever or for prolonged or severe pain or bleeding. She was advised to use OTC ibuprofen as needed for mild to moderate pain. She was advised to avoid vaginal intercourse for 48 hours or until the bleeding has completely stopped.  Attending Physician Documentation: I was present for or performed the following: endometrial biopsy

## 2016-03-31 ENCOUNTER — Ambulatory Visit (INDEPENDENT_AMBULATORY_CARE_PROVIDER_SITE_OTHER): Payer: TRICARE For Life (TFL) | Admitting: Obstetrics & Gynecology

## 2016-03-31 ENCOUNTER — Encounter: Payer: Self-pay | Admitting: Obstetrics & Gynecology

## 2016-03-31 VITALS — BP 112/82 | HR 84 | Ht 63.0 in | Wt 121.0 lb

## 2016-03-31 DIAGNOSIS — N840 Polyp of corpus uteri: Secondary | ICD-10-CM

## 2016-03-31 DIAGNOSIS — N950 Postmenopausal bleeding: Secondary | ICD-10-CM | POA: Diagnosis not present

## 2016-03-31 MED ORDER — PROGESTERONE MICRONIZED 200 MG PO CAPS: 200.0000 mg | ORAL_CAPSULE | Freq: Every day | ORAL | Status: DC

## 2016-03-31 NOTE — Progress Notes (Signed)
Patient ID: Morgan Fields, female   DOB: [birth year], 65 y.o.   MRN: [id number] Follow up appointment for results  No chief complaint on file.   Blood pressure 112/82, pulse 84, height 5\' 3"  (1.6 m), weight 121 lb (54.9 kg).    Endometrial biopsy: benign endometrium with polyp formation, benign, no hyperplasia or malignancy  MEDS ordered this encounter: Meds ordered this encounter  Medications  . progesterone (PROMETRIUM) 200 MG capsule    Sig: Take 1 capsule (200 mg total) by mouth daily.    Dispense:  90 capsule    Refill:  3    Orders for this encounter: No orders of the defined types were placed in this encounter.   Plan: Suppress with prometrium Follow Up:  Return if symptoms worsen or fail to improve.    Face to face time:  10 minutes  Greater than 50% of the visit time was spent in counseling and coordination of care with the patient.  The summary and outline of the counseling and care coordination is summarized in the note above.   All questions were answered.  Past Medical History:  Diagnosis Date  . Depression   . PMB (postmenopausal bleeding) 03/11/2016  . Post-menopause on HRT (hormone replacement therapy) 03/11/2016  . Skin cancer    Recent to anterior chest; cervical cancer  . Thickened endometrium 03/16/2016  . Vaginal Pap smear, abnormal     Past Surgical History:  Procedure Laterality Date  . BREAST ENHANCEMENT SURGERY    . CERVICAL BIOPSY     precancerous years ago  . CHOLECYSTECTOMY  2011  . COLONOSCOPY N/A 05/08/2013   Procedure: COLONOSCOPY;  Surgeon: Rogene Houston, MD;  Location: [facility];  Service: Endoscopy;  Laterality: N/A;  200  . TUBAL LIGATION      OB History    Gravida Para Term Preterm AB Living   3 2 1 1 1 2    SAB TAB Ectopic Multiple Live Births   1              No Known Allergies  Social History   Social History  . Marital status: Married    Spouse name: N/A  . Number of children: N/A  . Years of  education: N/A   Social History Main Topics  . Smoking status: Current Every Day Smoker    Years: 33.00    Types: Cigarettes  . Smokeless tobacco: Never Used     Comment: smokes 3 cig daily  . Alcohol use 7.2 oz/week    12 Cans of beer per week     Comment: wine every evening  . Drug use: No  . Sexual activity: Yes    Birth control/ protection: Post-menopausal   Other Topics Concern  . None   Social History Narrative  . None    Family History  Problem Relation Age of Onset  . Heart attack Brother   . Alcohol abuse Father   . Mental illness Brother   . Heart attack Paternal Grandfather   . Heart disease Paternal Grandfather   . Diabetes Paternal Grandfather   . Polycystic ovary syndrome Daughter   . Other Daughter     knee replacement  . Thyroid disease Daughter     had radiation on thyroid  . Cancer Maternal Grandmother     colon  . Diabetes Maternal Grandmother

## 2016-05-09 ENCOUNTER — Telehealth: Payer: Self-pay | Admitting: *Deleted

## 2016-05-09 ENCOUNTER — Other Ambulatory Visit: Payer: Self-pay | Admitting: Obstetrics & Gynecology

## 2016-05-09 MED ORDER — MEGESTROL ACETATE 40 MG PO TABS: ORAL_TABLET | ORAL | 3 refills | Status: DC

## 2016-05-09 NOTE — Telephone Encounter (Signed)
Pt informed per Dr. Elonda Husky, Megace e-scribed take as prescribed, stop the Prometrium for now, call office back in 1 month to see how Megace is working. Do not stop Megace until advised by Dr.Eure, may need D&C if no improvement with Megace.  Pt verbalized understanding.

## 2016-05-09 NOTE — Telephone Encounter (Signed)
Pt states taking the Progesterone 200 mg daily as prescribed per Dr. Elonda Husky and is still having vaginal bleeding every 2 weeks for 5 days.   Please advise.

## 2016-06-09 ENCOUNTER — Telehealth: Payer: Self-pay | Admitting: Obstetrics & Gynecology

## 2016-06-09 NOTE — Telephone Encounter (Signed)
Pt called stating that Dr. Elonda Husky has placed her on a medication and she was suppose to call back and let him know how is it working once she has finished. Pt would like to speak to either Dr. Elonda Husky or his nurse. Please contact pt

## 2016-06-09 NOTE — Telephone Encounter (Signed)
Pt states completed the Megestrol and has not had any vaginal bleeding. Please advise of next step.  Pt also states her understanding is that Megestrol was a synthectic drug, is this going to be a long term med and if so what are the side effects?

## 2016-06-09 NOTE — Telephone Encounter (Signed)
She is not suppose to stop the megestrol unless I told her to, I did want to hear how she was doing after 1 month on the megace  Since our other options did not work, I want her to stay on the megestrol for a period of 3 months longer and again do not stop the megestrol until I tell her to stop it  Stay on the megestrol cause it is working, make appointment to see me in 3 months and we will decide what to do from there  All progesterones are synthetic, there are no pharmaceutical progesterone that are not synthetic including prometrium medroxyprogesterone and the topical progesterone in creams and patches

## 2016-06-10 NOTE — Telephone Encounter (Signed)
Pt informed of Dr. Elonda Husky recommendations, appt made in 3 mos as a f/u. Pt verbalized understanding.

## 2016-07-04 ENCOUNTER — Telehealth: Payer: Self-pay | Admitting: Obstetrics & Gynecology

## 2016-07-05 NOTE — Telephone Encounter (Signed)
Spoke with pt. Pt was put on Megace 2 months ago. She has had acid reflux since starting med. Also, has a history of IBS. + diarrhea. Pt is taking a probiotic daily and eating yogurt daily.  Pt wonders if Megace could cause acid reflux. I spoke with Dr. Elonda Husky and he advised Megace could cause reflux but not diarrhea. Dr. Elonda Husky advised he would order med for reflux. Pt voiced understanding. [place]

## 2016-07-06 ENCOUNTER — Other Ambulatory Visit: Payer: Self-pay | Admitting: Obstetrics & Gynecology

## 2016-07-06 MED ORDER — RANITIDINE HCL 150 MG PO TABS: 150.0000 mg | ORAL_TABLET | Freq: Two times a day (BID) | ORAL | 11 refills | Status: DC

## 2016-07-07 NOTE — Telephone Encounter (Signed)
Pt states spoke with someone from our office yesterday and got all her questions answered.

## 2016-07-11 ENCOUNTER — Telehealth: Payer: Self-pay | Admitting: *Deleted

## 2016-07-19 NOTE — Telephone Encounter (Signed)
LMTRC x 3 attempts with no success.

## 2016-09-08 ENCOUNTER — Encounter: Payer: Self-pay | Admitting: Obstetrics & Gynecology

## 2016-09-08 ENCOUNTER — Ambulatory Visit (INDEPENDENT_AMBULATORY_CARE_PROVIDER_SITE_OTHER): Payer: TRICARE For Life (TFL) | Admitting: Obstetrics & Gynecology

## 2016-09-08 VITALS — BP 110/70 | HR 88 | Ht 63.0 in | Wt 121.3 lb

## 2016-09-08 DIAGNOSIS — N950 Postmenopausal bleeding: Secondary | ICD-10-CM

## 2016-09-08 DIAGNOSIS — N840 Polyp of corpus uteri: Secondary | ICD-10-CM

## 2016-09-08 NOTE — Progress Notes (Signed)
Patient ID: Morgan Fields, female   DOB: 13-Jul-1951, 65 y.o.   MRN: [id number] Preoperative History and Physical  Morgan Fields is a 65 y.o. [id number] with No LMP recorded. Patient is postmenopausal. admitted for a hysteroscopy uteinr curettage removal of endometrial polyp.  Bx 03/2016 benign polyp has continued to bleed despite megestrol therapy  PMH:    Past Medical History:  Diagnosis Date  . Depression   . PMB (postmenopausal bleeding) 03/11/2016  . Post-menopause on HRT (hormone replacement therapy) 03/11/2016  . Skin cancer    Recent to anterior chest; cervical cancer  . Thickened endometrium 03/16/2016  . Vaginal Pap smear, abnormal     PSH:     Past Surgical History:  Procedure Laterality Date  . BREAST ENHANCEMENT SURGERY    . CERVICAL BIOPSY     precancerous years ago  . CHOLECYSTECTOMY  2011  . COLONOSCOPY N/A 05/08/2013   Procedure: COLONOSCOPY;  Surgeon: Rogene Houston, MD;  Location: [facility];  Service: Endoscopy;  Laterality: N/A;  200  . TUBAL LIGATION      POb/GynH:      OB History    Gravida Para Term Preterm AB Living   3 2 1 1 1 2    SAB TAB Ectopic Multiple Live Births   1              SH:   Social History  Substance Use Topics  . Smoking status: Current Every Day Smoker    Years: 33.00    Types: Cigarettes  . Smokeless tobacco: Never Used     Comment: smokes 3 cig daily  . Alcohol use 7.2 oz/week    12 Cans of beer per week     Comment: wine every evening    FH:    Family History  Problem Relation Age of Onset  . Alcohol abuse Father   . Mental illness Brother   . Heart attack Paternal Grandfather   . Heart disease Paternal Grandfather   . Diabetes Paternal Grandfather   . Polycystic ovary syndrome Daughter   . Other Daughter     knee replacement  . Thyroid disease Daughter     had radiation on thyroid  . Cancer Maternal Grandmother     colon  . Diabetes Maternal Grandmother   . Heart attack Brother      Allergies: No  Known Allergies  Medications:       Current Outpatient Prescriptions:  .  cholecalciferol (VITAMIN D) 1000 units tablet, Take 5,000 Units by mouth daily., Disp: , Rfl:  .  estradiol (VIVELLE-DOT) 0.025 MG/24HR, Place 1 patch onto the skin once a week., Disp: , Rfl:  .  megestrol (MEGACE) 40 MG tablet, 3 tablets a day for 5 days, 2 tablets a day for 5 days then 1 tablet daily, Disp: 45 tablet, Rfl: 3 .  Melatonin 1 MG CAPS, Take by mouth at bedtime., Disp: , Rfl:  .  ranitidine (ZANTAC) 150 MG tablet, Take 1 tablet (150 mg total) by mouth 2 (two) times daily., Disp: 60 tablet, Rfl: 11 .  testosterone cypionate (DEPOTESTOSTERONE CYPIONATE) 200 MG/ML injection, Inject into the muscle once a week., Disp: , Rfl:   Review of Systems:   Review of Systems  Constitutional: Negative for fever, chills, weight loss, malaise/fatigue and diaphoresis.  HENT: Negative for hearing loss, ear pain, nosebleeds, congestion, sore throat, neck pain, tinnitus and ear discharge.   Eyes: Negative for blurred vision, double vision, photophobia, pain, discharge and redness.  Respiratory: Negative for cough, hemoptysis, sputum production, shortness of breath, wheezing and stridor.   Cardiovascular: Negative for chest pain, palpitations, orthopnea, claudication, leg swelling and PND.  Gastrointestinal: Positive for abdominal pain. Negative for heartburn, nausea, vomiting, diarrhea, constipation, blood in stool and melena.  Genitourinary: Negative for dysuria, urgency, frequency, hematuria and flank pain.  Musculoskeletal: Negative for myalgias, back pain, joint pain and falls.  Skin: Negative for itching and rash.  Neurological: Negative for dizziness, tingling, tremors, sensory change, speech change, focal weakness, seizures, loss of consciousness, weakness and headaches.  Endo/Heme/Allergies: Negative for environmental allergies and polydipsia. Does not bruise/bleed easily.  Psychiatric/Behavioral: Negative for  depression, suicidal ideas, hallucinations, memory loss and substance abuse. The patient is not nervous/anxious and does not have insomnia.      PHYSICAL EXAM:  Blood pressure 110/70, pulse 88, height 5\' 3"  (1.6 m), weight 121 lb 4.8 oz (55 kg).    Vitals reviewed. Constitutional: She is oriented to person, place, and time. She appears well-developed and well-nourished.  HENT:  Head: Normocephalic and atraumatic.  Right Ear: External ear normal.  Left Ear: External ear normal.  Nose: Nose normal.  Mouth/Throat: Oropharynx is clear and moist.  Eyes: Conjunctivae and EOM are normal. Pupils are equal, round, and reactive to light. Right eye exhibits no discharge. Left eye exhibits no discharge. No scleral icterus.  Neck: Normal range of motion. Neck supple. No tracheal deviation present. No thyromegaly present.  Cardiovascular: Normal rate, regular rhythm, normal heart sounds and intact distal pulses.  Exam reveals no gallop and no friction rub.   No murmur heard. Respiratory: Effort normal and breath sounds normal. No respiratory distress. She has no wheezes. She has no rales. She exhibits no tenderness.  GI: Soft. Bowel sounds are normal. She exhibits no distension and no mass. There is tenderness. There is no rebound and no guarding.  Genitourinary:       Vulva is normal without lesions Vagina is pink moist without discharge Cervix normal in appearance and pap is normal Uterus is normal size, contour, position, consistency, mobility, non-tender Adnexa is negative with normal sized ovaries by sonogram  Musculoskeletal: Normal range of motion. She exhibits no edema and no tenderness.  Neurological: She is alert and oriented to person, place, and time. She has normal reflexes. She displays normal reflexes. No cranial nerve deficit. She exhibits normal muscle tone. Coordination normal.  Skin: Skin is warm and dry. No rash noted. No erythema. No pallor.  Psychiatric: She has a normal mood  and affect. Her behavior is normal. Judgment and thought content normal.    Labs: No results found for this or any previous visit (from the past 336 hour(s)).  EKG: No orders found for this or any previous visit.  Imaging Studies: No results found.    Assessment: Endometrial polyp Patient Active Problem List   Diagnosis Date Noted  . Thickened endometrium 03/16/2016  . PMB (postmenopausal bleeding) 03/11/2016  . Post-menopause on HRT (hormone replacement therapy) 03/11/2016  . Loss of weight 04/15/2013  . Diarrhea 04/15/2013  . Skin cancer 04/15/2013    Plan: Persistent bleeding due to endometrial polyp despite aggressive endometrial suppression, 10/05/2016  Morgan Fields 09/08/2016 12:02 PM

## 2016-09-27 NOTE — Patient Instructions (Signed)
Morgan Fields  09/27/2016     @PREFPERIOPPHARMACY @   Your procedure is scheduled on  10/05/2016   Report to [facility] at  1000  A.M.  Call this number if you have problems the morning of surgery:  [phone]   Remember:  Do not eat food or drink liquids after midnight.  Take these medicines the morning of surgery with A SIP OF WATER  Zantac.   Do not wear jewelry, make-up or nail polish.  Do not wear lotions, powders, or perfumes, or deoderant.  Do not shave 48 hours prior to surgery.  Men may shave face and neck.  Do not bring valuables to the hospital.  [facility] is not responsible for any belongings or valuables.  Contacts, dentures or bridgework may not be worn into surgery.  Leave your suitcase in the car.  After surgery it may be brought to your room.  For patients admitted to the hospital, discharge time will be determined by your treatment team.  Patients discharged the day of surgery will not be allowed to drive home.   Name and phone number of your driver:   family Special instructions:  none  Please read over the following fact sheets that you were given. Anesthesia Post-op Instructions and Care and Recovery After Surgery      Hysteroscopy Hysteroscopy is a procedure used for looking inside the womb (uterus). It may be done for various reasons, including:  To evaluate abnormal bleeding, fibroid (benign, noncancerous) tumors, polyps, scar tissue (adhesions), and possibly cancer of the uterus.  To look for lumps (tumors) and other uterine growths.  To look for causes of why a woman cannot get pregnant (infertility), causes of recurrent loss of pregnancy (miscarriages), or a lost intrauterine device (IUD).  To perform a sterilization by blocking the fallopian tubes from inside the uterus. In this procedure, a thin, flexible tube with a tiny light and camera on the end of it (hysteroscope) is used to look inside the uterus. A hysteroscopy  should be done right after a menstrual period to be sure you are not pregnant. LET [facility] CARE PROVIDER KNOW ABOUT:   Any allergies you have.  All medicines you are taking, including vitamins, herbs, eye drops, creams, and over-the-counter medicines.  Previous problems you or members of your family have had with the use of anesthetics.  Any blood disorders you have.  Previous surgeries you have had.  Medical conditions you have. RISKS AND COMPLICATIONS  Generally, this is a safe procedure. However, as with any procedure, complications can occur. Possible complications include:  Putting a hole in the uterus.  Excessive bleeding.  Infection.  Damage to the cervix.  Injury to other organs.  Allergic reaction to medicines.  Too much fluid used in the uterus for the procedure. BEFORE THE PROCEDURE   Ask your health care provider about changing or stopping any regular medicines.  Do not take aspirin or blood thinners for 1 week before the procedure, or as directed by your health care provider. These can cause bleeding.  If you smoke, do not smoke for 2 weeks before the procedure.  In some cases, a medicine is placed in the cervix the day before the procedure. This medicine makes the cervix have a larger opening (dilate). This makes it easier for the instrument to be inserted into the uterus during the procedure.  Do not eat or drink anything for at least 8 hours  before the surgery.  Arrange for someone to take you home after the procedure. PROCEDURE   You may be given a medicine to relax you (sedative). You may also be given one of the following:  A medicine that numbs the area around the cervix (local anesthetic).  A medicine that makes you sleep through the procedure (general anesthetic).  The hysteroscope is inserted through the vagina into the uterus. The camera on the hysteroscope sends a picture to a TV screen. This gives the surgeon a good view inside the  uterus.  During the procedure, air or a liquid is put into the uterus, which allows the surgeon to see better.  Sometimes, tissue is gently scraped from inside the uterus. These tissue samples are sent to a lab for testing. AFTER THE PROCEDURE   If you had a general anesthetic, you may be groggy for a couple hours after the procedure.  If you had a local anesthetic, you will be able to go home as soon as you are stable and feel ready.  You may have some cramping. This normally lasts for a couple days.  You may have bleeding, which varies from light spotting for a few days to menstrual-like bleeding for 3-7 days. This is normal.  If your test results are not back during the visit, make an appointment with your health care provider to find out the results. This information is not intended to replace advice given to you by your health care provider. Make sure you discuss any questions you have with your health care provider. Document Released: 12/12/2000 Document Revised: 06/26/2013 Document Reviewed: 04/04/2013 Elsevier Interactive Patient Education  2017 [place]. Hysteroscopy, Care After Refer to this sheet in the next few weeks. These instructions provide you with information on caring for yourself after your procedure. Your health care provider may also give you more specific instructions. Your treatment has been planned according to current medical practices, but problems sometimes occur. Call your health care provider if you have any problems or questions after your procedure.  WHAT TO EXPECT AFTER THE PROCEDURE After your procedure, it is typical to have the following:  You may have some cramping. This normally lasts for a couple days.  You may have bleeding. This can vary from light spotting for a few days to menstrual-like bleeding for 3-7 days. HOME CARE INSTRUCTIONS  Rest for the first 1-2 days after the procedure.  Only take over-the-counter or prescription medicines as  directed by your health care provider. Do not take aspirin. It can increase the chances of bleeding.  Take showers instead of baths for 2 weeks or as directed by your health care provider.  Do not drive for 24 hours or as directed.  Do not drink alcohol while taking pain medicine.  Do not use tampons, douche, or have sexual intercourse for 2 weeks or until your health care provider says it is okay.  Take your temperature twice a day for 4-5 days. Write it down each time.  Follow your health care provider's advice about diet, exercise, and lifting.  If you develop constipation, you may:  Take a mild laxative if your health care provider approves.  Add bran foods to your diet.  Drink enough fluids to keep your urine clear or pale yellow.  Try to have someone with you or available to you for the first 24-48 hours, especially if you were given a general anesthetic.  Follow up with your health care provider as directed. [place]  IF:  You feel dizzy or lightheaded.  You feel sick to your stomach (nauseous).  You have abnormal vaginal discharge.  You have a rash.  You have pain that is not controlled with medicine. SEEK IMMEDIATE MEDICAL CARE IF:  You have bleeding that is heavier than a normal menstrual period.  You have a fever.  You have increasing cramps or pain, not controlled with medicine.  You have new belly (abdominal) pain.  You pass out.  You have pain in the tops of your shoulders (shoulder strap areas).  You have shortness of breath. This information is not intended to replace advice given to you by your health care provider. Make sure you discuss any questions you have with your health care provider. Document Released: 06/26/2013 Document Reviewed: 06/26/2013 Elsevier Interactive Patient Education  2017 [place] Anesthesia, Adult General anesthesia is the use of medicines to make a person "go to sleep" (be unconscious) for a  medical procedure. General anesthesia is often recommended when a procedure:  Is long.  Requires you to be still or in an unusual position.  Is major and can cause you to lose blood.  Is impossible to do without general anesthesia. The medicines used for general anesthesia are called general anesthetics. In addition to making you sleep, the medicines:  Prevent pain.  Control your blood pressure.  Relax your muscles. Tell a health care provider about:  Any allergies you have.  All medicines you are taking, including vitamins, herbs, eye drops, creams, and over-the-counter medicines.  Any problems you or family members have had with anesthetic medicines.  Types of anesthetics you have had in the past.  Any bleeding disorders you have.  Any surgeries you have had.  Any medical conditions you have.  Any history of heart or lung conditions, such as heart failure, sleep apnea, or chronic obstructive pulmonary disease (COPD).  Whether you are pregnant or may be pregnant.  Whether you use tobacco, alcohol, marijuana, or street drugs.  Any history of Armed forces logistics/support/administrative officer.  Any history of depression or anxiety. What are the risks? Generally, this is a safe procedure. However, problems may occur, including:  Allergic reaction to anesthetics.  Lung and heart problems.  Inhaling food or liquids from your stomach into your lungs (aspiration).  Injury to nerves.  Waking up during your procedure and being unable to move (rare).  Extreme agitation or a state of mental confusion (delirium) when you wake up from the anesthetic.  Air in the bloodstream, which can lead to stroke. These problems are more likely to develop if you are having a major surgery or if you have an advanced medical condition. You can prevent some of these complications by answering all of your health care provider's questions thoroughly and by following all pre-procedure instructions. General anesthesia can  cause side effects, including:  Nausea or vomiting  A sore throat from the breathing tube.  Feeling cold or shivery.  Feeling tired, washed out, or achy.  Sleepiness or drowsiness.  Confusion or agitation. What happens before the procedure? Staying hydrated  Follow instructions from your health care provider about hydration, which may include:  Up to 2 hours before the procedure - you may continue to drink clear liquids, such as water, clear fruit juice, black coffee, and plain tea. Eating and drinking restrictions  Follow instructions from your health care provider about eating and drinking, which may include:  8 hours before the procedure - stop eating heavy meals or foods such  as meat, fried foods, or fatty foods.  6 hours before the procedure - stop eating light meals or foods, such as toast or cereal.  6 hours before the procedure - stop drinking milk or drinks that contain milk.  2 hours before the procedure - stop drinking clear liquids. Medicines  Ask your health care provider about:  Changing or stopping your regular medicines. This is especially important if you are taking diabetes medicines or blood thinners.  Taking medicines such as aspirin and ibuprofen. These medicines can thin your blood. Do not take these medicines before your procedure if your health care provider instructs you not to.  Taking new dietary supplements or medicines. Do not take these during the week before your procedure unless your health care provider approves them.  If you are told to take a medicine or to continue taking a medicine on the day of the procedure, take the medicine with sips of water. General instructions   Ask if you will be going home the same day, the following day, or after a longer hospital stay.  Plan to have someone take you home.  Plan to have someone stay with you for the first 24 hours after you leave the hospital or clinic.  For 3-6 weeks before the procedure,  try not to use any tobacco products, such as cigarettes, chewing tobacco, and e-cigarettes.  You may brush your teeth on the morning of the procedure, but make sure to spit out the toothpaste. What happens during the procedure?  You will be given anesthetics through a mask and through an IV tube in one of your veins.  You may receive medicine to help you relax (sedative).  As soon as you are asleep, a breathing tube may be used to help you breathe.  An anesthesia specialist will stay with you throughout the procedure. He or she will help keep you comfortable and safe by continuing to give you medicines and adjusting the amount of medicine that you get. He or she will also watch your blood pressure, pulse, and oxygen levels to make sure that the anesthetics do not cause any problems.  If a breathing tube was used to help you breathe, it will be removed before you wake up. The procedure may vary among health care providers and hospitals. What happens after the procedure?  You will wake up, often slowly, after the procedure is complete, usually in a recovery area.  Your blood pressure, heart rate, breathing rate, and blood oxygen level will be monitored until the medicines you were given have worn off.  You may be given medicine to help you calm down if you feel anxious or agitated.  If you will be going home the same day, your health care provider may check to make sure you can stand, drink, and urinate.  Your health care providers will treat your pain and side effects before you go home.  Do not drive for 24 hours if you received a sedative.  You may:  Feel nauseous and vomit.  Have a sore throat.  Have mental slowness.  Feel cold or shivery.  Feel sleepy.  Feel tired.  Feel sore or achy, even in parts of your body where you did not have surgery. This information is not intended to replace advice given to you by your health care provider. Make sure you discuss any questions  you have with your health care provider. Document Released: 12/13/2007 Document Revised: 02/16/2016 Document Reviewed: 08/20/2015 Elsevier Interactive Patient Education  2017 [place] Anesthesia, Adult, Care After These instructions provide you with information about caring for yourself after your procedure. Your health care provider may also give you more specific instructions. Your treatment has been planned according to current medical practices, but problems sometimes occur. Call your health care provider if you have any problems or questions after your procedure. What can I expect after the procedure? After the procedure, it is common to have:  Vomiting.  A sore throat.  Mental slowness. It is common to feel:  Nauseous.  Cold or shivery.  Sleepy.  Tired.  Sore or achy, even in parts of your body where you did not have surgery. Follow these instructions at home: For at least 24 hours after the procedure:  Do not:  Participate in activities where you could fall or become injured.  Drive.  Use heavy machinery.  Drink alcohol.  Take sleeping pills or medicines that cause drowsiness.  Make important decisions or sign legal documents.  Take care of children on your own.  Rest. Eating and drinking  If you vomit, drink water, juice, or soup when you can drink without vomiting.  Drink enough fluid to keep your urine clear or pale yellow.  Make sure you have little or no nausea before eating solid foods.  Follow the diet recommended by your health care provider. General instructions  Have a responsible adult stay with you until you are awake and alert.  Return to your normal activities as told by your health care provider. Ask your health care provider what activities are safe for you.  Take over-the-counter and prescription medicines only as told by your health care provider.  If you smoke, do not smoke without supervision.  Keep all follow-up  visits as told by your health care provider. This is important. Contact a health care provider if:  You continue to have nausea or vomiting at home, and medicines are not helpful.  You cannot drink fluids or start eating again.  You cannot urinate after 8-12 hours.  You develop a skin rash.  You have fever.  You have increasing redness at the site of your procedure. Get help right away if:  You have difficulty breathing.  You have chest pain.  You have unexpected bleeding.  You feel that you are having a life-threatening or urgent problem. This information is not intended to replace advice given to you by your health care provider. Make sure you discuss any questions you have with your health care provider. Document Released: 12/12/2000 Document Revised: 02/08/2016 Document Reviewed: 08/20/2015 Elsevier Interactive Patient Education  2017 [employer].

## 2016-09-30 ENCOUNTER — Encounter (HOSPITAL_COMMUNITY): Payer: Self-pay

## 2016-09-30 ENCOUNTER — Encounter (HOSPITAL_COMMUNITY)
Admission: RE | Admit: 2016-09-30 | Discharge: 2016-09-30 | Disposition: A | Discharge status: Home / Self Care | Source: Ambulatory Visit | Attending: Obstetrics & Gynecology | Admitting: Obstetrics & Gynecology

## 2016-09-30 ENCOUNTER — Other Ambulatory Visit: Payer: Self-pay

## 2016-09-30 DIAGNOSIS — Z01812 Encounter for preprocedural laboratory examination: Secondary | ICD-10-CM | POA: Insufficient documentation

## 2016-09-30 HISTORY — DX: Other complications of anesthesia, initial encounter: T88.59XA

## 2016-09-30 HISTORY — DX: Adverse effect of unspecified anesthetic, initial encounter: T41.45XA

## 2016-09-30 LAB — URINALYSIS, ROUTINE W REFLEX MICROSCOPIC
BACTERIA UA: NONE SEEN
Bilirubin Urine: NEGATIVE
Glucose, UA: NEGATIVE mg/dL
Ketones, ur: NEGATIVE mg/dL
Leukocytes, UA: NEGATIVE
Nitrite: NEGATIVE
Protein, ur: NEGATIVE mg/dL
SPECIFIC GRAVITY, URINE: 1.006 (ref 1.005–1.030)
pH: 6 (ref 5.0–8.0)

## 2016-09-30 LAB — COMPREHENSIVE METABOLIC PANEL
ALBUMIN: 4.5 g/dL (ref 3.5–5.0)
ALT: 13 U/L — ABNORMAL LOW (ref 14–54)
AST: 16 U/L (ref 15–41)
Alkaline Phosphatase: 36 U/L — ABNORMAL LOW (ref 38–126)
Anion gap: 6 (ref 5–15)
BILIRUBIN TOTAL: 0.6 mg/dL (ref 0.3–1.2)
BUN: 18 mg/dL (ref 6–20)
CHLORIDE: 106 mmol/L (ref 101–111)
CO2: 25 mmol/L (ref 22–32)
Calcium: 9.6 mg/dL (ref 8.9–10.3)
Creatinine, Ser: 0.89 mg/dL (ref 0.44–1.00)
GFR calc Af Amer: >60 mL/min (ref >60)
GFR calc non Af Amer: >60 mL/min (ref >60)
GLUCOSE: 88 mg/dL (ref 65–99)
Potassium: 3.2 mmol/L — ABNORMAL LOW (ref 3.5–5.1)
Sodium: 137 mmol/L (ref 135–145)
TOTAL PROTEIN: 7.4 g/dL (ref 6.5–8.1)

## 2016-09-30 LAB — CBC
HEMATOCRIT: 42.3 % (ref 36.0–46.0)
Hemoglobin: 14.2 g/dL (ref 12.0–15.0)
MCH: 29.7 pg (ref 26.0–34.0)
MCHC: 33.6 g/dL (ref 30.0–36.0)
MCV: 88.5 fL (ref 78.0–100.0)
Platelets: 366 10*3/uL (ref 150–400)
RBC: 4.78 MIL/uL (ref 3.87–5.11)
RDW: 13.4 % (ref 11.5–15.5)
WBC: 10.4 10*3/uL (ref 4.0–10.5)

## 2016-10-03 ENCOUNTER — Encounter (HOSPITAL_COMMUNITY): Payer: Self-pay

## 2016-10-03 NOTE — Pre-Procedure Instructions (Signed)
Potassium of 3.2 shown to Dr Patsey Berthold. No orders given.

## 2016-10-05 ENCOUNTER — Encounter (HOSPITAL_COMMUNITY)
Admission: RE | Disposition: A | Discharge status: Home / Self Care | Payer: Self-pay | Source: Ambulatory Visit | Attending: Obstetrics & Gynecology

## 2016-10-05 ENCOUNTER — Encounter (HOSPITAL_COMMUNITY): Payer: Self-pay | Admitting: *Deleted

## 2016-10-05 ENCOUNTER — Ambulatory Visit (HOSPITAL_COMMUNITY): Admitting: Anesthesiology

## 2016-10-05 ENCOUNTER — Ambulatory Visit (HOSPITAL_COMMUNITY)
Admission: RE | Admit: 2016-10-05 | Discharge: 2016-10-05 | Disposition: A | Discharge status: Home / Self Care | Source: Ambulatory Visit | Attending: Obstetrics & Gynecology | Admitting: Obstetrics & Gynecology

## 2016-10-05 DIAGNOSIS — K219 Gastro-esophageal reflux disease without esophagitis: Secondary | ICD-10-CM | POA: Insufficient documentation

## 2016-10-05 DIAGNOSIS — F329 Major depressive disorder, single episode, unspecified: Secondary | ICD-10-CM | POA: Diagnosis not present

## 2016-10-05 DIAGNOSIS — N840 Polyp of corpus uteri: Secondary | ICD-10-CM | POA: Insufficient documentation

## 2016-10-05 DIAGNOSIS — Z85828 Personal history of other malignant neoplasm of skin: Secondary | ICD-10-CM | POA: Insufficient documentation

## 2016-10-05 DIAGNOSIS — N950 Postmenopausal bleeding: Secondary | ICD-10-CM | POA: Diagnosis not present

## 2016-10-05 DIAGNOSIS — Z79890 Hormone replacement therapy (postmenopausal): Secondary | ICD-10-CM | POA: Insufficient documentation

## 2016-10-05 DIAGNOSIS — Z8541 Personal history of malignant neoplasm of cervix uteri: Secondary | ICD-10-CM | POA: Insufficient documentation

## 2016-10-05 DIAGNOSIS — F17210 Nicotine dependence, cigarettes, uncomplicated: Secondary | ICD-10-CM | POA: Insufficient documentation

## 2016-10-05 DIAGNOSIS — Z79899 Other long term (current) drug therapy: Secondary | ICD-10-CM | POA: Insufficient documentation

## 2016-10-05 DIAGNOSIS — R938 Abnormal findings on diagnostic imaging of other specified body structures: Secondary | ICD-10-CM | POA: Diagnosis not present

## 2016-10-05 HISTORY — PX: POLYPECTOMY: SHX5525

## 2016-10-05 HISTORY — PX: HYSTEROSCOPY WITH D & C: SHX1775

## 2016-10-05 SURGERY — DILATATION AND CURETTAGE /HYSTEROSCOPY
Anesthesia: General

## 2016-10-05 MED ORDER — KETOROLAC TROMETHAMINE 30 MG/ML IJ SOLN
30.0000 mg | Freq: Once | INTRAMUSCULAR | Status: AC
  Administered 2016-10-05: 30 mg via INTRAVENOUS
  Filled 2016-10-05: qty 1

## 2016-10-05 MED ORDER — ONDANSETRON HCL 4 MG/2ML IJ SOLN
INTRAMUSCULAR | Status: DC | PRN
  Administered 2016-10-05: 4 mg via INTRAVENOUS

## 2016-10-05 MED ORDER — NEOSTIGMINE METHYLSULFATE 10 MG/10ML IV SOLN
INTRAVENOUS | Status: DC | PRN
  Administered 2016-10-05: 2 mg via INTRAVENOUS

## 2016-10-05 MED ORDER — CEFAZOLIN SODIUM-DEXTROSE 2-4 GM/100ML-% IV SOLN
2.0000 g | INTRAVENOUS | Status: DC
  Filled 2016-10-05: qty 100

## 2016-10-05 MED ORDER — CEFAZOLIN SODIUM-DEXTROSE 2-4 GM/100ML-% IV SOLN
INTRAVENOUS | Status: AC
  Filled 2016-10-05: qty 100

## 2016-10-05 MED ORDER — MIDAZOLAM HCL 2 MG/2ML IJ SOLN
0.5000 mg | INTRAMUSCULAR | Status: DC | PRN
  Administered 2016-10-05 (×2): 2 mg via INTRAVENOUS

## 2016-10-05 MED ORDER — FENTANYL CITRATE (PF) 250 MCG/5ML IJ SOLN
INTRAMUSCULAR | Status: AC
  Filled 2016-10-05: qty 5

## 2016-10-05 MED ORDER — LIDOCAINE HCL (PF) 1 % IJ SOLN
INTRAMUSCULAR | Status: AC
  Filled 2016-10-05: qty 5

## 2016-10-05 MED ORDER — FENTANYL CITRATE (PF) 100 MCG/2ML IJ SOLN: 25.0000 ug | INTRAMUSCULAR | Status: DC | PRN

## 2016-10-05 MED ORDER — SUCCINYLCHOLINE CHLORIDE 20 MG/ML IJ SOLN
INTRAMUSCULAR | Status: AC
  Filled 2016-10-05: qty 1

## 2016-10-05 MED ORDER — MIDAZOLAM HCL 2 MG/2ML IJ SOLN
INTRAMUSCULAR | Status: AC
  Filled 2016-10-05: qty 2

## 2016-10-05 MED ORDER — PROPOFOL 10 MG/ML IV BOLUS
INTRAVENOUS | Status: AC
  Filled 2016-10-05: qty 20

## 2016-10-05 MED ORDER — ONDANSETRON HCL 4 MG/2ML IJ SOLN
INTRAMUSCULAR | Status: AC
  Filled 2016-10-05: qty 2

## 2016-10-05 MED ORDER — ROCURONIUM BROMIDE 50 MG/5ML IV SOLN
INTRAVENOUS | Status: AC
  Filled 2016-10-05: qty 1

## 2016-10-05 MED ORDER — SODIUM CHLORIDE 0.9 % IR SOLN
Status: DC | PRN
  Administered 2016-10-05: 3000 mL

## 2016-10-05 MED ORDER — EPHEDRINE SULFATE 50 MG/ML IJ SOLN
INTRAMUSCULAR | Status: DC | PRN
  Administered 2016-10-05: 10 mg via INTRAVENOUS

## 2016-10-05 MED ORDER — GLYCOPYRROLATE 0.2 MG/ML IJ SOLN
INTRAMUSCULAR | Status: AC
  Filled 2016-10-05: qty 2

## 2016-10-05 MED ORDER — ROCURONIUM 10MG/ML (10ML) SYRINGE FOR MEDFUSION PUMP - OPTIME
INTRAVENOUS | Status: DC | PRN
  Administered 2016-10-05: 10 mg via INTRAVENOUS
  Administered 2016-10-05: 5 mg via INTRAVENOUS

## 2016-10-05 MED ORDER — ONDANSETRON HCL 4 MG/2ML IJ SOLN
4.0000 mg | Freq: Once | INTRAMUSCULAR | Status: AC
  Administered 2016-10-05: 4 mg via INTRAVENOUS

## 2016-10-05 MED ORDER — ONDANSETRON HCL 8 MG PO TABS: 8.0000 mg | ORAL_TABLET | Freq: Three times a day (TID) | ORAL | 0 refills | Status: DC | PRN

## 2016-10-05 MED ORDER — LACTATED RINGERS IV SOLN
INTRAVENOUS | Status: DC
  Administered 2016-10-05 (×2): via INTRAVENOUS

## 2016-10-05 MED ORDER — LIDOCAINE HCL (CARDIAC) 10 MG/ML IV SOLN
INTRAVENOUS | Status: DC | PRN
  Administered 2016-10-05 (×2): 50 mg via INTRAVENOUS

## 2016-10-05 MED ORDER — SUCCINYLCHOLINE 20MG/ML (10ML) SYRINGE FOR MEDFUSION PUMP - OPTIME
INTRAMUSCULAR | Status: DC | PRN
  Administered 2016-10-05: 90 mg via INTRAVENOUS

## 2016-10-05 MED ORDER — HYDROCODONE-ACETAMINOPHEN 5-325 MG PO TABS: 1.0000 | ORAL_TABLET | Freq: Four times a day (QID) | ORAL | 0 refills | Status: DC | PRN

## 2016-10-05 MED ORDER — PROPOFOL 10 MG/ML IV BOLUS
INTRAVENOUS | Status: DC | PRN
  Administered 2016-10-05: 120 mg via INTRAVENOUS

## 2016-10-05 MED ORDER — 0.9 % SODIUM CHLORIDE (POUR BTL) OPTIME
TOPICAL | Status: DC | PRN
  Administered 2016-10-05: 1000 mL

## 2016-10-05 MED ORDER — KETOROLAC TROMETHAMINE 10 MG PO TABS: 10.0000 mg | ORAL_TABLET | Freq: Three times a day (TID) | ORAL | 0 refills | Status: DC | PRN

## 2016-10-05 MED ORDER — GLYCOPYRROLATE 0.2 MG/ML IJ SOLN
INTRAMUSCULAR | Status: DC | PRN
  Administered 2016-10-05: .4 mg via INTRAVENOUS

## 2016-10-05 SURGICAL SUPPLY — 28 items
BAG HAMPER (MISCELLANEOUS) ×3 IMPLANT
CLOTH BEACON ORANGE TIMEOUT ST (SAFETY) ×3 IMPLANT
COVER LIGHT HANDLE STERIS (MISCELLANEOUS) ×6 IMPLANT
FORMALIN 10 PREFIL 120ML (MISCELLANEOUS) ×3 IMPLANT
GLOVE BIOGEL PI IND STRL 6.5 (GLOVE) ×1 IMPLANT
GLOVE BIOGEL PI IND STRL 7.0 (GLOVE) ×2 IMPLANT
GLOVE BIOGEL PI IND STRL 8 (GLOVE) ×1 IMPLANT
GLOVE BIOGEL PI INDICATOR 6.5 (GLOVE) ×2
GLOVE BIOGEL PI INDICATOR 7.0 (GLOVE) ×4
GLOVE BIOGEL PI INDICATOR 8 (GLOVE) ×2
GLOVE ECLIPSE 8.0 STRL XLNG CF (GLOVE) ×3 IMPLANT
GLOVE SURG SS PI 6.5 STRL IVOR (GLOVE) ×3 IMPLANT
GOWN STRL REUS W/TWL LRG LVL3 (GOWN DISPOSABLE) ×3 IMPLANT
GOWN STRL REUS W/TWL XL LVL3 (GOWN DISPOSABLE) ×3 IMPLANT
INST SET HYSTEROSCOPY (KITS) ×3 IMPLANT
IV NS 1000ML (IV SOLUTION) ×2
IV NS 1000ML BAXH (IV SOLUTION) ×1 IMPLANT
IV NS IRRIG 3000ML ARTHROMATIC (IV SOLUTION) ×3 IMPLANT
KIT ROOM TURNOVER AP CYSTO (KITS) ×3 IMPLANT
MANIFOLD NEPTUNE II (INSTRUMENTS) ×3 IMPLANT
NS IRRIG 1000ML POUR BTL (IV SOLUTION) ×3 IMPLANT
PACK PERI GYN (CUSTOM PROCEDURE TRAY) ×3 IMPLANT
PAD ARMBOARD 7.5X6 YLW CONV (MISCELLANEOUS) ×3 IMPLANT
PAD TELFA 3X4 1S STER (GAUZE/BANDAGES/DRESSINGS) ×3 IMPLANT
SET BASIN LINEN APH (SET/KITS/TRAYS/PACK) ×3 IMPLANT
SET IRRIG Y TYPE TUR BLADDER L (SET/KITS/TRAYS/PACK) ×3 IMPLANT
TOWEL OR 17X26 4PK STRL BLUE (TOWEL DISPOSABLE) ×3 IMPLANT
YANKAUER SUCT BULB TIP 10FT TU (MISCELLANEOUS) ×3 IMPLANT

## 2016-10-05 NOTE — H&P (View-Only) (Signed)
Patient ID: Morgan Fields, female   DOB: 10-23-1950, 66 y.o.   MRN: [id number] Preoperative History and Physical  Morgan Fields is a 66 y.o. [id number] with No LMP recorded. Patient is postmenopausal. admitted for a hysteroscopy uteinr curettage removal of endometrial polyp.  Bx 03/2016 benign polyp has continued to bleed despite megestrol therapy  PMH:    Past Medical History:  Diagnosis Date  . Depression   . PMB (postmenopausal bleeding) 03/11/2016  . Post-menopause on HRT (hormone replacement therapy) 03/11/2016  . Skin cancer    Recent to anterior chest; cervical cancer  . Thickened endometrium 03/16/2016  . Vaginal Pap smear, abnormal     PSH:     Past Surgical History:  Procedure Laterality Date  . BREAST ENHANCEMENT SURGERY    . CERVICAL BIOPSY     precancerous years ago  . CHOLECYSTECTOMY  2011  . COLONOSCOPY N/A 05/08/2013   Procedure: COLONOSCOPY;  Surgeon: Rogene Houston, MD;  Location: [facility];  Service: Endoscopy;  Laterality: N/A;  200  . TUBAL LIGATION      POb/GynH:      OB History    Gravida Para Term Preterm AB Living   3 2 1 1 1 2    SAB TAB Ectopic Multiple Live Births   1              SH:   Social History  Substance Use Topics  . Smoking status: Current Every Day Smoker    Years: 33.00    Types: Cigarettes  . Smokeless tobacco: Never Used     Comment: smokes 3 cig daily  . Alcohol use 7.2 oz/week    12 Cans of beer per week     Comment: wine every evening    FH:    Family History  Problem Relation Age of Onset  . Alcohol abuse Father   . Mental illness Brother   . Heart attack Paternal Grandfather   . Heart disease Paternal Grandfather   . Diabetes Paternal Grandfather   . Polycystic ovary syndrome Daughter   . Other Daughter     knee replacement  . Thyroid disease Daughter     had radiation on thyroid  . Cancer Maternal Grandmother     colon  . Diabetes Maternal Grandmother   . Heart attack Brother      Allergies: No  Known Allergies  Medications:       Current Outpatient Prescriptions:  .  cholecalciferol (VITAMIN D) 1000 units tablet, Take 5,000 Units by mouth daily., Disp: , Rfl:  .  estradiol (VIVELLE-DOT) 0.025 MG/24HR, Place 1 patch onto the skin once a week., Disp: , Rfl:  .  megestrol (MEGACE) 40 MG tablet, 3 tablets a day for 5 days, 2 tablets a day for 5 days then 1 tablet daily, Disp: 45 tablet, Rfl: 3 .  Melatonin 1 MG CAPS, Take by mouth at bedtime., Disp: , Rfl:  .  ranitidine (ZANTAC) 150 MG tablet, Take 1 tablet (150 mg total) by mouth 2 (two) times daily., Disp: 60 tablet, Rfl: 11 .  testosterone cypionate (DEPOTESTOSTERONE CYPIONATE) 200 MG/ML injection, Inject into the muscle once a week., Disp: , Rfl:   Review of Systems:   Review of Systems  Constitutional: Negative for fever, chills, weight loss, malaise/fatigue and diaphoresis.  HENT: Negative for hearing loss, ear pain, nosebleeds, congestion, sore throat, neck pain, tinnitus and ear discharge.   Eyes: Negative for blurred vision, double vision, photophobia, pain, discharge and redness.  Respiratory: Negative for cough, hemoptysis, sputum production, shortness of breath, wheezing and stridor.   Cardiovascular: Negative for chest pain, palpitations, orthopnea, claudication, leg swelling and PND.  Gastrointestinal: Positive for abdominal pain. Negative for heartburn, nausea, vomiting, diarrhea, constipation, blood in stool and melena.  Genitourinary: Negative for dysuria, urgency, frequency, hematuria and flank pain.  Musculoskeletal: Negative for myalgias, back pain, joint pain and falls.  Skin: Negative for itching and rash.  Neurological: Negative for dizziness, tingling, tremors, sensory change, speech change, focal weakness, seizures, loss of consciousness, weakness and headaches.  Endo/Heme/Allergies: Negative for environmental allergies and polydipsia. Does not bruise/bleed easily.  Psychiatric/Behavioral: Negative for  depression, suicidal ideas, hallucinations, memory loss and substance abuse. The patient is not nervous/anxious and does not have insomnia.      PHYSICAL EXAM:  Blood pressure 110/70, pulse 88, height 5\' 3"  (1.6 m), weight 121 lb 4.8 oz (55 kg).    Vitals reviewed. Constitutional: She is oriented to person, place, and time. She appears well-developed and well-nourished.  HENT:  Head: Normocephalic and atraumatic.  Right Ear: External ear normal.  Left Ear: External ear normal.  Nose: Nose normal.  Mouth/Throat: Oropharynx is clear and moist.  Eyes: Conjunctivae and EOM are normal. Pupils are equal, round, and reactive to light. Right eye exhibits no discharge. Left eye exhibits no discharge. No scleral icterus.  Neck: Normal range of motion. Neck supple. No tracheal deviation present. No thyromegaly present.  Cardiovascular: Normal rate, regular rhythm, normal heart sounds and intact distal pulses.  Exam reveals no gallop and no friction rub.   No murmur heard. Respiratory: Effort normal and breath sounds normal. No respiratory distress. She has no wheezes. She has no rales. She exhibits no tenderness.  GI: Soft. Bowel sounds are normal. She exhibits no distension and no mass. There is tenderness. There is no rebound and no guarding.  Genitourinary:       Vulva is normal without lesions Vagina is pink moist without discharge Cervix normal in appearance and pap is normal Uterus is normal size, contour, position, consistency, mobility, non-tender Adnexa is negative with normal sized ovaries by sonogram  Musculoskeletal: Normal range of motion. She exhibits no edema and no tenderness.  Neurological: She is alert and oriented to person, place, and time. She has normal reflexes. She displays normal reflexes. No cranial nerve deficit. She exhibits normal muscle tone. Coordination normal.  Skin: Skin is warm and dry. No rash noted. No erythema. No pallor.  Psychiatric: She has a normal mood  and affect. Her behavior is normal. Judgment and thought content normal.    Labs: No results found for this or any previous visit (from the past 336 hour(s)).  EKG: No orders found for this or any previous visit.  Imaging Studies: No results found.    Assessment: Endometrial polyp Patient Active Problem List   Diagnosis Date Noted  . Thickened endometrium 03/16/2016  . PMB (postmenopausal bleeding) 03/11/2016  . Post-menopause on HRT (hormone replacement therapy) 03/11/2016  . Loss of weight 04/15/2013  . Diarrhea 04/15/2013  . Skin cancer 04/15/2013    Plan: Persistent bleeding due to endometrial polyp despite aggressive endometrial suppression, 10/05/2016  EURE,LUTHER H 09/08/2016 12:02 PM

## 2016-10-05 NOTE — Transfer of Care (Signed)
Immediate Anesthesia Transfer of Care Note  Patient: Morgan Fields  Procedure(s) Performed: Procedure(s): HYSTEROSCOPY; UTERINE CURETTAGE (N/A) REMOVAL OF ENDOMETRIAL POLYP (N/A)  Patient Location: PACU  Anesthesia Type:General  Level of Consciousness: awake, alert  and oriented  Airway & Oxygen Therapy: Patient Spontanous Breathing and Patient connected to face mask oxygen  Post-op Assessment: Report given to RN  Post vital signs: Reviewed and stable  Last Vitals:  Vitals:   10/05/16 1150 10/05/16 1155  BP: 115/66   Pulse:    Resp: 13 (!) 22  Temp:      Last Pain:  Vitals:   10/05/16 1024  TempSrc: Oral      Patients Stated Pain Goal: 8 ([id number])  Complications: No apparent anesthesia complications

## 2016-10-05 NOTE — H&P (Signed)
Preoperative History and Physical  Morgan Fields is a 66 y.o. [id number] with No LMP recorded. Patient is postmenopausal. admitted for a hysteroscopy uteinr curettage removal of endometrial polyp.  Bx 03/2016 benign polyp has continued to bleed despite megestrol therapy  PMH:        Past Medical History:  Diagnosis Date  . Depression   . PMB (postmenopausal bleeding) 03/11/2016  . Post-menopause on HRT (hormone replacement therapy) 03/11/2016  . Skin cancer    Recent to anterior chest; cervical cancer  . Thickened endometrium 03/16/2016  . Vaginal Pap smear, abnormal     PSH:          Past Surgical History:  Procedure Laterality Date  . BREAST ENHANCEMENT SURGERY    . CERVICAL BIOPSY     precancerous years ago  . CHOLECYSTECTOMY  2011  . COLONOSCOPY N/A 05/08/2013   Procedure: COLONOSCOPY;  Surgeon: Rogene Houston, MD;  Location: [facility];  Service: Endoscopy;  Laterality: N/A;  200  . TUBAL LIGATION      POb/GynH:              OB History    Gravida Para Term Preterm AB Living   3 2 1 1 1 2    SAB TAB Ectopic Multiple Live Births   1              SH:          Social History  Substance Use Topics  . Smoking status: Current Every Day Smoker    Years: 33.00    Types: Cigarettes  . Smokeless tobacco: Never Used     Comment: smokes 3 cig daily  . Alcohol use 7.2 oz/week     12 Cans of beer per week      Comment: wine every evening    FH:          Family History  Problem Relation Age of Onset  . Alcohol abuse Father   . Mental illness Brother   . Heart attack Paternal Grandfather   . Heart disease Paternal Grandfather   . Diabetes Paternal Grandfather   . Polycystic ovary syndrome Daughter   . Other Daughter     knee replacement  . Thyroid disease Daughter     had radiation on thyroid  . Cancer Maternal Grandmother     colon  . Diabetes Maternal Grandmother   . Heart attack Brother       Allergies: No Known Allergies  Medications:       Current Outpatient Prescriptions:  .  cholecalciferol (VITAMIN D) 1000 units tablet, Take 5,000 Units by mouth daily., Disp: , Rfl:  .  estradiol (VIVELLE-DOT) 0.025 MG/24HR, Place 1 patch onto the skin once a week., Disp: , Rfl:  .  megestrol (MEGACE) 40 MG tablet, 3 tablets a day for 5 days, 2 tablets a day for 5 days then 1 tablet daily, Disp: 45 tablet, Rfl: 3 .  Melatonin 1 MG CAPS, Take by mouth at bedtime., Disp: , Rfl:  .  ranitidine (ZANTAC) 150 MG tablet, Take 1 tablet (150 mg total) by mouth 2 (two) times daily., Disp: 60 tablet, Rfl: 11 .  testosterone cypionate (DEPOTESTOSTERONE CYPIONATE) 200 MG/ML injection, Inject into the muscle once a week., Disp: , Rfl:   Review of Systems:   Review of Systems  Constitutional: Negative for fever, chills, weight loss, malaise/fatigue and diaphoresis.  HENT: Negative for hearing loss, ear pain, nosebleeds, congestion, sore throat, neck pain, tinnitus and  ear discharge.   Eyes: Negative for blurred vision, double vision, photophobia, pain, discharge and redness.  Respiratory: Negative for cough, hemoptysis, sputum production, shortness of breath, wheezing and stridor.   Cardiovascular: Negative for chest pain, palpitations, orthopnea, claudication, leg swelling and PND.  Gastrointestinal: Positive for abdominal pain. Negative for heartburn, nausea, vomiting, diarrhea, constipation, blood in stool and melena.  Genitourinary: Negative for dysuria, urgency, frequency, hematuria and flank pain.  Musculoskeletal: Negative for myalgias, back pain, joint pain and falls.  Skin: Negative for itching and rash.  Neurological: Negative for dizziness, tingling, tremors, sensory change, speech change, focal weakness, seizures, loss of consciousness, weakness and headaches.  Endo/Heme/Allergies: Negative for environmental allergies and polydipsia. Does not bruise/bleed easily.   Psychiatric/Behavioral: Negative for depression, suicidal ideas, hallucinations, memory loss and substance abuse. The patient is not nervous/anxious and does not have insomnia.      PHYSICAL EXAM:  Blood pressure 110/70, pulse 88, height 5\' 3"  (1.6 m), weight 121 lb 4.8 oz (55 kg).    Vitals reviewed. Constitutional: She is oriented to person, place, and time. She appears well-developed and well-nourished.  HENT:  Head: Normocephalic and atraumatic.  Right Ear: External ear normal.  Left Ear: External ear normal.  Nose: Nose normal.  Mouth/Throat: Oropharynx is clear and moist.  Eyes: Conjunctivae and EOM are normal. Pupils are equal, round, and reactive to light. Right eye exhibits no discharge. Left eye exhibits no discharge. No scleral icterus.  Neck: Normal range of motion. Neck supple. No tracheal deviation present. No thyromegaly present.  Cardiovascular: Normal rate, regular rhythm, normal heart sounds and intact distal pulses.  Exam reveals no gallop and no friction rub.   No murmur heard. Respiratory: Effort normal and breath sounds normal. No respiratory distress. She has no wheezes. She has no rales. She exhibits no tenderness.  GI: Soft. Bowel sounds are normal. She exhibits no distension and no mass. There is tenderness. There is no rebound and no guarding.  Genitourinary:       Vulva is normal without lesions Vagina is pink moist without discharge Cervix normal in appearance and pap is normal Uterus is normal size, contour, position, consistency, mobility, non-tender Adnexa is negative with normal sized ovaries by sonogram  Musculoskeletal: Normal range of motion. She exhibits no edema and no tenderness.  Neurological: She is alert and oriented to person, place, and time. She has normal reflexes. She displays normal reflexes. No cranial nerve deficit. She exhibits normal muscle tone. Coordination normal.  Skin: Skin is warm and dry. No rash noted. No erythema. No  pallor.  Psychiatric: She has a normal mood and affect. Her behavior is normal. Judgment and thought content normal.    Labs: No results found for this or any previous visit (from the past 336 hour(s)).  EKG: No orders found for this or any previous visit.  Imaging Studies: ImagingResults  No results found.      Assessment: Endometrial polyp     Patient Active Problem List   Diagnosis Date Noted  . Thickened endometrium 03/16/2016  . PMB (postmenopausal bleeding) 03/11/2016  . Post-menopause on HRT (hormone replacement therapy) 03/11/2016  . Loss of weight 04/15/2013  . Diarrhea 04/15/2013  . Skin cancer 04/15/2013    Plan: Persistent bleeding due to endometrial polyp despite aggressive endometrial suppression, 10/05/2016  Koda Routon H 09/08/2016 12:02 PM

## 2016-10-05 NOTE — Op Note (Signed)
Preoperative diagnosis: Postmenopausal bleeding                                         Thickened endometrium, benign pathology in office, known endometrial polyp   Postoperative diagnosis: Endometrial polyp x 2   Procedure: Operative hysteroscopy with removal of 2 polyps,  and endometrial curettage   Surgeon: Tania Ade H   Anesthesia: Laryngeal mask airway   Findings: The patient experienced postmenopausal bleeding and we did a sonogram in the office which revealed a thickened endometrium and endometrial polyp.  Pathology from [facility] was benign but pt has continued to experience bleeding despite aggressive endometrial suppression    This morning the patient had 2 relatively large polyps one larger than the other.The endometrium itself appeared to be normal.   Description of operation: Patient was taken to the operating room and placed in the supine position where she underwent laryngeal mask airway anesthesia. She was placed in the dorsal lithotomy position.  She was prepped and draped in the usual sterile fashion.  A Graves speculum was placed.  The cervix was grasped with a single-tooth tenaculum.  The cervix was dilated serially using Hegar dilators to allow passage of the hysteroscope.  The hysteroscope was then placed into the endometrial cavity without difficulty and the above noted findings were seen.  Randall stone forceps ring forceps and the uterine curet were used to remove the 2 polyps The endometrium was thoroughly curetted with good uterine cry in all areas  Hysteroscopic reevaluation confirmed the removal of all endometrial pathology  There was good hemostasis with only suspected minor endometrial oozing The patient tolerated the procedure well  She experienced minimal blood loss  She was taken to the recovery room in good stable condition  All counts were correct x3  She received 2 g of Ancef and 30 mg of Toradol preoperatively  She will be followed up in the office  next week for postoperative visit and to review the pathology which I expect to be benign  Florian Buff, MD 10/05/2016 12:30 PM

## 2016-10-05 NOTE — Anesthesia Preprocedure Evaluation (Signed)
Anesthesia Evaluation  Patient identified by MRN, date of birth, ID band Patient awake    Reviewed: Allergy & Precautions, NPO status , Patient's Chart, lab work & pertinent test results  History of Anesthesia Complications (+) history of anesthetic complications ("slow wake up")  Airway Mallampati: II  TM Distance: >3 FB     Dental  (+) Teeth Intact   Pulmonary Current Smoker,    breath sounds clear to auscultation       Cardiovascular negative cardio ROS   Rhythm:Regular     Neuro/Psych Depression    GI/Hepatic GERD  Poorly Controlled,  Endo/Other    Renal/GU      Musculoskeletal   Abdominal   Peds  Hematology   Anesthesia Other Findings   Reproductive/Obstetrics                             Anesthesia Physical Anesthesia Plan  ASA: II  Anesthesia Plan: General   Post-op Pain Management:    Induction: Intravenous, Rapid sequence and Cricoid pressure planned  Airway Management Planned: Oral ETT  Additional Equipment:   Intra-op Plan:   Post-operative Plan: Extubation in OR  Informed Consent: I have reviewed the patients History and Physical, chart, labs and discussed the procedure including the risks, benefits and alternatives for the proposed anesthesia with the patient or authorized representative who has indicated his/her understanding and acceptance.     Plan Discussed with:   Anesthesia Plan Comments:         Anesthesia Quick Evaluation

## 2016-10-05 NOTE — Anesthesia Postprocedure Evaluation (Signed)
Anesthesia Post Note  Patient: Raechel K Sula  Procedure(s) Performed: Procedure(s) (LRB): HYSTEROSCOPY; UTERINE CURETTAGE (N/A) REMOVAL OF ENDOMETRIAL POLYP (N/A)  Patient location during evaluation: PACU Anesthesia Type: General Level of consciousness: awake and alert Pain management: satisfactory to patient Vital Signs Assessment: post-procedure vital signs reviewed and stable Respiratory status: spontaneous breathing Cardiovascular status: stable Anesthetic complications: no     Last Vitals:  Vitals:   10/05/16 1300 10/05/16 1315  BP: 106/61 105/62  Pulse: 65 68  Resp: 16 16  Temp:      Last Pain:  Vitals:   10/05/16 1024  TempSrc: Oral                 Janel Beane

## 2016-10-05 NOTE — Discharge Instructions (Signed)
Hysteroscopy, Care After °Refer to this sheet in the next few weeks. These instructions provide you with information on caring for yourself after your procedure. Your health care provider may also give you more specific instructions. Your treatment has been planned according to current medical practices, but problems sometimes occur. Call your health care provider if you have any problems or questions after your procedure.  °WHAT TO EXPECT AFTER THE PROCEDURE °After your procedure, it is typical to have the following: °· You may have some cramping. This normally lasts for a couple days. °· You may have bleeding. This can vary from light spotting for a few days to menstrual-like bleeding for 3-7 days. °HOME CARE INSTRUCTIONS °· Rest for the first 1-2 days after the procedure. °· Only take over-the-counter or prescription medicines as directed by your health care provider. Do not take aspirin. It can increase the chances of bleeding. °· Take showers instead of baths for 2 weeks or as directed by your health care provider. °· Do not drive for 24 hours or as directed. °· Do not drink alcohol while taking pain medicine. °· Do not use tampons, douche, or have sexual intercourse for 2 weeks or until your health care provider says it is okay. °· Take your temperature twice a day for 4-5 days. Write it down each time. °· Follow your health care provider's advice about diet, exercise, and lifting. °· If you develop constipation, you may: °¨ Take a mild laxative if your health care provider approves. °¨ Add bran foods to your diet. °¨ Drink enough fluids to keep your urine clear or pale yellow. °· Try to have someone with you or available to you for the first 24-48 hours, especially if you were given a general anesthetic. °· Follow up with your health care provider as directed. °SEEK MEDICAL CARE IF: °· You feel dizzy or lightheaded. °· You feel sick to your stomach (nauseous). °· You have abnormal vaginal discharge. °· You  have a rash. °· You have pain that is not controlled with medicine. °SEEK IMMEDIATE MEDICAL CARE IF: °· You have bleeding that is heavier than a normal menstrual period. °· You have a fever. °· You have increasing cramps or pain, not controlled with medicine. °· You have new belly (abdominal) pain. °· You pass out. °· You have pain in the tops of your shoulders (shoulder strap areas). °· You have shortness of breath. °This information is not intended to replace advice given to you by your health care provider. Make sure you discuss any questions you have with your health care provider. °Document Released: 06/26/2013 Document Reviewed: 06/26/2013 °Elsevier Interactive Patient Education © 2017 [employer] ° °

## 2016-10-05 NOTE — Interval H&P Note (Signed)
History and Physical Interval Note:  10/05/2016 11:52 AM  [place]  has presented today for surgery, with the diagnosis of persistent postmenopausal bleeding;enodmetrial polyp  The various methods of treatment have been discussed with the patient and family. After consideration of risks, benefits and other options for treatment, the patient has consented to  Procedure(s): HYSTEROSCOPY; UTERINE CURETTAGE (N/A) REMOVAL OF ENDOMETRIAL POLYP (N/A) as a surgical intervention .  The patient's history has been reviewed, patient examined, no change in status, stable for surgery.  I have reviewed the patient's chart and labs.  Questions were answered to the patient's satisfaction.     EURE,LUTHER H

## 2016-10-05 NOTE — Anesthesia Procedure Notes (Signed)
Procedure Name: Intubation Date/Time: 10/05/2016 12:04 PM Performed by: Tressie Stalker E Pre-anesthesia Checklist: Patient identified, Patient being monitored, Timeout performed, Emergency Drugs available and Suction available Patient Re-evaluated:Patient Re-evaluated prior to inductionOxygen Delivery Method: Circle system utilized Preoxygenation: Pre-oxygenation with 100% oxygen Intubation Type: IV induction, Rapid sequence and Cricoid Pressure applied Ventilation: Mask ventilation without difficulty Laryngoscope Size: Mac and 3 Grade View: Grade I Tube type: Oral Tube size: 7.0 mm Number of attempts: 1 Airway Equipment and Method: Stylet Placement Confirmation: ETT inserted through vocal cords under direct vision,  positive ETCO2 and breath sounds checked- equal and bilateral Secured at: 21 cm Tube secured with: Tape Dental Injury: Teeth and Oropharynx as per pre-operative assessment

## 2016-10-06 ENCOUNTER — Encounter (HOSPITAL_COMMUNITY): Payer: Self-pay | Admitting: Obstetrics & Gynecology

## 2016-10-17 ENCOUNTER — Ambulatory Visit (INDEPENDENT_AMBULATORY_CARE_PROVIDER_SITE_OTHER): Payer: TRICARE For Life (TFL) | Admitting: Obstetrics & Gynecology

## 2016-10-17 ENCOUNTER — Encounter: Payer: Self-pay | Admitting: Obstetrics & Gynecology

## 2016-10-17 VITALS — BP 130/80 | HR 72 | Wt 125.0 lb

## 2016-10-17 DIAGNOSIS — Z9889 Other specified postprocedural states: Secondary | ICD-10-CM | POA: Diagnosis not present

## 2016-10-17 DIAGNOSIS — N950 Postmenopausal bleeding: Secondary | ICD-10-CM | POA: Diagnosis not present

## 2016-10-17 DIAGNOSIS — N840 Polyp of corpus uteri: Secondary | ICD-10-CM | POA: Diagnosis not present

## 2016-10-17 MED ORDER — PROGESTERONE MICRONIZED 200 MG PO CAPS: 200.0000 mg | ORAL_CAPSULE | Freq: Every day | ORAL | 11 refills | Status: DC

## 2016-10-17 NOTE — Progress Notes (Signed)
  HPI: Patient returns for routine postoperative follow-up having undergone hysteroscopy uterine curettage endometrial  on 10/05/2016.  The patient's immediate postoperative recovery has been unremarkable. Since hospital discharge the patient reports no problems.   Current Outpatient Prescriptions: OVER THE COUNTER MEDICATION, Take 1 tablet by mouth daily. Bifido - probiotic, Disp: , Rfl:  testosterone cypionate (DEPOTESTOSTERONE CYPIONATE) 200 MG/ML injection, Inject 5 mg into the muscle every 7 (seven) days. Sundays. Vial concentration = 25mg /ml. Injects 0.48ml (5mg )., Disp: , Rfl:   No current facility-administered medications for this visit.     Blood pressure 130/80, pulse 72, weight 125 lb (56.7 kg).  Physical Exam: Normal post op exam  Diagnostic Tests:   Pathology: Pathology 2 benign polyps  Impression: S/p hysteroscopy D&C for PMB and endometrial polyps  Plan: Restart prometrium nightly  Follow up: 1  years   Meds ordered this encounter  Medications  . DISCONTD: progesterone (PROMETRIUM) 200 MG capsule    Sig: Take 1 capsule (200 mg total) by mouth daily.    Dispense:  30 capsule    Refill:  11  . progesterone (PROMETRIUM) 200 MG capsule    Sig: Take 1 capsule (200 mg total) by mouth daily.    Dispense:  30 capsule    Refill:  11     Florian Buff, MD

## 2017-01-23 ENCOUNTER — Other Ambulatory Visit: Payer: Self-pay | Admitting: Obstetrics & Gynecology

## 2017-01-23 DIAGNOSIS — Z1231 Encounter for screening mammogram for malignant neoplasm of breast: Secondary | ICD-10-CM

## 2017-02-03 ENCOUNTER — Ambulatory Visit (HOSPITAL_COMMUNITY)
Admission: RE | Admit: 2017-02-03 | Discharge: 2017-02-03 | Disposition: A | Discharge status: Home / Self Care | Source: Ambulatory Visit | Attending: Obstetrics & Gynecology | Admitting: Obstetrics & Gynecology

## 2017-02-03 DIAGNOSIS — Z1231 Encounter for screening mammogram for malignant neoplasm of breast: Secondary | ICD-10-CM

## 2017-04-19 ENCOUNTER — Ambulatory Visit (HOSPITAL_COMMUNITY)
Admission: RE | Admit: 2017-04-19 | Discharge: 2017-04-19 | Disposition: A | Discharge status: Home / Self Care | Source: Ambulatory Visit | Attending: Neurology | Admitting: Neurology

## 2017-04-19 ENCOUNTER — Other Ambulatory Visit (HOSPITAL_COMMUNITY): Payer: Self-pay | Admitting: Neurology

## 2017-04-19 DIAGNOSIS — M4312 Spondylolisthesis, cervical region: Secondary | ICD-10-CM | POA: Insufficient documentation

## 2017-04-19 DIAGNOSIS — M542 Cervicalgia: Secondary | ICD-10-CM | POA: Insufficient documentation

## 2017-04-19 DIAGNOSIS — M47812 Spondylosis without myelopathy or radiculopathy, cervical region: Secondary | ICD-10-CM | POA: Insufficient documentation

## 2017-05-17 ENCOUNTER — Other Ambulatory Visit: Payer: Self-pay | Admitting: Obstetrics & Gynecology

## 2017-05-31 DIAGNOSIS — Z23 Encounter for immunization: Secondary | ICD-10-CM | POA: Diagnosis not present

## 2017-06-05 DIAGNOSIS — M9901 Segmental and somatic dysfunction of cervical region: Secondary | ICD-10-CM | POA: Diagnosis not present

## 2017-06-05 DIAGNOSIS — M5033 Other cervical disc degeneration, cervicothoracic region: Secondary | ICD-10-CM | POA: Diagnosis not present

## 2017-06-07 DIAGNOSIS — M9901 Segmental and somatic dysfunction of cervical region: Secondary | ICD-10-CM | POA: Diagnosis not present

## 2017-06-07 DIAGNOSIS — M5033 Other cervical disc degeneration, cervicothoracic region: Secondary | ICD-10-CM | POA: Diagnosis not present

## 2017-06-08 DIAGNOSIS — M9901 Segmental and somatic dysfunction of cervical region: Secondary | ICD-10-CM | POA: Diagnosis not present

## 2017-06-08 DIAGNOSIS — M5033 Other cervical disc degeneration, cervicothoracic region: Secondary | ICD-10-CM | POA: Diagnosis not present

## 2017-06-12 DIAGNOSIS — M9901 Segmental and somatic dysfunction of cervical region: Secondary | ICD-10-CM | POA: Diagnosis not present

## 2017-06-12 DIAGNOSIS — M5033 Other cervical disc degeneration, cervicothoracic region: Secondary | ICD-10-CM | POA: Diagnosis not present

## 2017-06-14 DIAGNOSIS — M9901 Segmental and somatic dysfunction of cervical region: Secondary | ICD-10-CM | POA: Diagnosis not present

## 2017-06-14 DIAGNOSIS — M5033 Other cervical disc degeneration, cervicothoracic region: Secondary | ICD-10-CM | POA: Diagnosis not present

## 2017-06-15 DIAGNOSIS — M5033 Other cervical disc degeneration, cervicothoracic region: Secondary | ICD-10-CM | POA: Diagnosis not present

## 2017-06-15 DIAGNOSIS — M9901 Segmental and somatic dysfunction of cervical region: Secondary | ICD-10-CM | POA: Diagnosis not present

## 2017-06-19 DIAGNOSIS — M5033 Other cervical disc degeneration, cervicothoracic region: Secondary | ICD-10-CM | POA: Diagnosis not present

## 2017-06-19 DIAGNOSIS — M9901 Segmental and somatic dysfunction of cervical region: Secondary | ICD-10-CM | POA: Diagnosis not present

## 2017-06-21 DIAGNOSIS — M5033 Other cervical disc degeneration, cervicothoracic region: Secondary | ICD-10-CM | POA: Diagnosis not present

## 2017-06-21 DIAGNOSIS — M9901 Segmental and somatic dysfunction of cervical region: Secondary | ICD-10-CM | POA: Diagnosis not present

## 2017-06-22 DIAGNOSIS — M9901 Segmental and somatic dysfunction of cervical region: Secondary | ICD-10-CM | POA: Diagnosis not present

## 2017-06-22 DIAGNOSIS — M5033 Other cervical disc degeneration, cervicothoracic region: Secondary | ICD-10-CM | POA: Diagnosis not present

## 2017-06-26 DIAGNOSIS — M5033 Other cervical disc degeneration, cervicothoracic region: Secondary | ICD-10-CM | POA: Diagnosis not present

## 2017-06-26 DIAGNOSIS — M9901 Segmental and somatic dysfunction of cervical region: Secondary | ICD-10-CM | POA: Diagnosis not present

## 2017-06-28 DIAGNOSIS — M5033 Other cervical disc degeneration, cervicothoracic region: Secondary | ICD-10-CM | POA: Diagnosis not present

## 2017-06-28 DIAGNOSIS — M9901 Segmental and somatic dysfunction of cervical region: Secondary | ICD-10-CM | POA: Diagnosis not present

## 2017-07-05 DIAGNOSIS — M5033 Other cervical disc degeneration, cervicothoracic region: Secondary | ICD-10-CM | POA: Diagnosis not present

## 2017-07-05 DIAGNOSIS — M9901 Segmental and somatic dysfunction of cervical region: Secondary | ICD-10-CM | POA: Diagnosis not present

## 2017-07-31 DIAGNOSIS — R6882 Decreased libido: Secondary | ICD-10-CM | POA: Diagnosis not present

## 2017-07-31 DIAGNOSIS — F331 Major depressive disorder, recurrent, moderate: Secondary | ICD-10-CM | POA: Diagnosis not present

## 2017-07-31 DIAGNOSIS — E2839 Other primary ovarian failure: Secondary | ICD-10-CM | POA: Diagnosis not present

## 2017-07-31 DIAGNOSIS — E559 Vitamin D deficiency, unspecified: Secondary | ICD-10-CM | POA: Diagnosis not present

## 2017-07-31 DIAGNOSIS — Z23 Encounter for immunization: Secondary | ICD-10-CM | POA: Diagnosis not present

## 2017-08-24 DIAGNOSIS — M5033 Other cervical disc degeneration, cervicothoracic region: Secondary | ICD-10-CM | POA: Diagnosis not present

## 2017-08-24 DIAGNOSIS — M9901 Segmental and somatic dysfunction of cervical region: Secondary | ICD-10-CM | POA: Diagnosis not present

## 2017-08-31 DIAGNOSIS — M5033 Other cervical disc degeneration, cervicothoracic region: Secondary | ICD-10-CM | POA: Diagnosis not present

## 2017-08-31 DIAGNOSIS — Z23 Encounter for immunization: Secondary | ICD-10-CM | POA: Diagnosis not present

## 2017-08-31 DIAGNOSIS — M9901 Segmental and somatic dysfunction of cervical region: Secondary | ICD-10-CM | POA: Diagnosis not present

## 2017-09-04 DIAGNOSIS — M9901 Segmental and somatic dysfunction of cervical region: Secondary | ICD-10-CM | POA: Diagnosis not present

## 2017-09-04 DIAGNOSIS — M5033 Other cervical disc degeneration, cervicothoracic region: Secondary | ICD-10-CM | POA: Diagnosis not present

## 2017-09-06 DIAGNOSIS — M5033 Other cervical disc degeneration, cervicothoracic region: Secondary | ICD-10-CM | POA: Diagnosis not present

## 2017-09-06 DIAGNOSIS — M9901 Segmental and somatic dysfunction of cervical region: Secondary | ICD-10-CM | POA: Diagnosis not present

## 2017-10-03 DIAGNOSIS — Z85828 Personal history of other malignant neoplasm of skin: Secondary | ICD-10-CM | POA: Diagnosis not present

## 2017-10-03 DIAGNOSIS — L570 Actinic keratosis: Secondary | ICD-10-CM | POA: Diagnosis not present

## 2017-11-09 DIAGNOSIS — E2839 Other primary ovarian failure: Secondary | ICD-10-CM | POA: Diagnosis not present

## 2017-11-09 DIAGNOSIS — R5383 Other fatigue: Secondary | ICD-10-CM | POA: Diagnosis not present

## 2017-11-09 DIAGNOSIS — E559 Vitamin D deficiency, unspecified: Secondary | ICD-10-CM | POA: Diagnosis not present

## 2017-11-09 DIAGNOSIS — M810 Age-related osteoporosis without current pathological fracture: Secondary | ICD-10-CM | POA: Diagnosis not present

## 2017-11-09 DIAGNOSIS — R6882 Decreased libido: Secondary | ICD-10-CM | POA: Diagnosis not present

## 2017-11-09 DIAGNOSIS — E785 Hyperlipidemia, unspecified: Secondary | ICD-10-CM | POA: Diagnosis not present

## 2017-11-09 DIAGNOSIS — F331 Major depressive disorder, recurrent, moderate: Secondary | ICD-10-CM | POA: Diagnosis not present

## 2017-11-28 ENCOUNTER — Ambulatory Visit (INDEPENDENT_AMBULATORY_CARE_PROVIDER_SITE_OTHER): Payer: Medicare Other | Admitting: Adult Health

## 2017-11-28 ENCOUNTER — Other Ambulatory Visit (HOSPITAL_COMMUNITY)
Admission: RE | Admit: 2017-11-28 | Discharge: 2017-11-28 | Disposition: A | Discharge status: Home / Self Care | Payer: Medicare Other | Source: Ambulatory Visit | Attending: Adult Health | Admitting: Adult Health

## 2017-11-28 ENCOUNTER — Encounter: Payer: Self-pay | Admitting: Adult Health

## 2017-11-28 VITALS — BP 118/70 | HR 64 | Ht 63.0 in | Wt 120.5 lb

## 2017-11-28 DIAGNOSIS — Z1211 Encounter for screening for malignant neoplasm of colon: Secondary | ICD-10-CM | POA: Diagnosis not present

## 2017-11-28 DIAGNOSIS — Z01419 Encounter for gynecological examination (general) (routine) without abnormal findings: Secondary | ICD-10-CM

## 2017-11-28 DIAGNOSIS — Z124 Encounter for screening for malignant neoplasm of cervix: Secondary | ICD-10-CM | POA: Diagnosis not present

## 2017-11-28 DIAGNOSIS — Z79890 Hormone replacement therapy (postmenopausal): Secondary | ICD-10-CM | POA: Diagnosis not present

## 2017-11-28 DIAGNOSIS — Z1212 Encounter for screening for malignant neoplasm of rectum: Secondary | ICD-10-CM

## 2017-11-28 LAB — HEMOCCULT GUIAC POC 1CARD (OFFICE): Fecal Occult Blood, POC: NEGATIVE

## 2017-11-28 NOTE — Progress Notes (Signed)
Patient ID: Morgan Fields, female   DOB: 1951/01/17, 67 y.o.   MRN: [id number] History of Present Illness:  Morgan Fields is a 67 year old white female, married, PM, in for well woman gyn exam and pap. PCP is Dr Anastasio Champion.  Current Medications, Allergies, Past Medical History, Past Surgical History, Family History and Social History were reviewed in [employer] record.     Review of Systems: Patient denies any headaches, hearing loss, fatigue, blurred vision, shortness of breath, chest pain, abdominal pain, problems with bowel movements, urination, or intercourse. No joint pain or mood swings.No bleeding, doing well since polyp removal.     Physical Exam:BP 118/70 (BP Location: Left Arm, Patient Position: Sitting, Cuff Size: Normal)   Pulse 64   Ht 5\' 3"  (1.6 m)   Wt 120 lb 8 oz (54.7 kg)   LMP  (Exact Date)   BMI 21.35 kg/m  General:  Well developed, well nourished, no acute distress Skin:  Warm and dry Neck:  Midline trachea, normal thyroid, good ROM, no lymphadenopathy,no carotid bruits heard Lungs; Clear to auscultation bilaterally Breast:  No dominant palpable mass, retraction, or nipple discharge,has bilateral implants Cardiovascular: Regular rate and rhythm Abdomen:  Soft, non tender, no hepatosplenomegaly Pelvic:  External genitalia is normal in appearance, no lesions.  The vagina is pale pink with loss of rugae but moisture is still good. Urethra has no lesions or masses. The cervix is smooth, pap with HPV performed.  Uterus is felt to be normal size, shape, and contour.  No adnexal masses or tenderness noted.Bladder is non tender, no masses felt. Rectal: Good sphincter tone, no polyps, or hemorrhoids felt.  Hemoccult negative. Extremities/musculoskeletal:  No swelling or varicosities noted, no clubbing or cyanosis Psych:  No mood changes, alert and cooperative,seems happy PHQ 2 score 0.  Impression:  1. Encounter for gynecological examination with  Papanicolaou smear of cervix   2. Post-menopause on HRT (hormone replacement therapy)   3. Screening for colorectal cancer      Plan: Physical in 2 years Pap in 3 if normal Mammogram yearly Labs with PCP Colonoscopy per GI

## 2017-11-30 LAB — CYTOLOGY - PAP
Diagnosis: NEGATIVE
HPV: NOT DETECTED

## 2018-02-13 DIAGNOSIS — M810 Age-related osteoporosis without current pathological fracture: Secondary | ICD-10-CM | POA: Diagnosis not present

## 2018-02-13 DIAGNOSIS — R5383 Other fatigue: Secondary | ICD-10-CM | POA: Diagnosis not present

## 2018-02-13 DIAGNOSIS — E2839 Other primary ovarian failure: Secondary | ICD-10-CM | POA: Diagnosis not present

## 2018-02-13 DIAGNOSIS — R6882 Decreased libido: Secondary | ICD-10-CM | POA: Diagnosis not present

## 2018-02-19 ENCOUNTER — Other Ambulatory Visit (HOSPITAL_COMMUNITY): Payer: Self-pay | Admitting: Internal Medicine

## 2018-02-19 DIAGNOSIS — Z780 Asymptomatic menopausal state: Secondary | ICD-10-CM

## 2018-03-08 ENCOUNTER — Ambulatory Visit (HOSPITAL_COMMUNITY)
Admission: RE | Admit: 2018-03-08 | Discharge: 2018-03-08 | Disposition: A | Discharge status: Home / Self Care | Payer: Medicare Other | Source: Ambulatory Visit | Attending: Internal Medicine | Admitting: Internal Medicine

## 2018-03-08 DIAGNOSIS — Z13820 Encounter for screening for osteoporosis: Secondary | ICD-10-CM | POA: Diagnosis not present

## 2018-03-08 DIAGNOSIS — Z780 Asymptomatic menopausal state: Secondary | ICD-10-CM | POA: Diagnosis not present

## 2018-03-08 DIAGNOSIS — M8580 Other specified disorders of bone density and structure, unspecified site: Secondary | ICD-10-CM | POA: Insufficient documentation

## 2018-03-08 DIAGNOSIS — M8589 Other specified disorders of bone density and structure, multiple sites: Secondary | ICD-10-CM | POA: Diagnosis not present

## 2018-04-16 DIAGNOSIS — E2839 Other primary ovarian failure: Secondary | ICD-10-CM | POA: Diagnosis not present

## 2018-04-16 DIAGNOSIS — R6882 Decreased libido: Secondary | ICD-10-CM | POA: Diagnosis not present

## 2018-04-16 DIAGNOSIS — M810 Age-related osteoporosis without current pathological fracture: Secondary | ICD-10-CM | POA: Diagnosis not present

## 2018-05-12 ENCOUNTER — Other Ambulatory Visit: Payer: Self-pay | Admitting: Obstetrics & Gynecology

## 2018-06-06 DIAGNOSIS — M5432 Sciatica, left side: Secondary | ICD-10-CM | POA: Diagnosis not present

## 2018-06-12 ENCOUNTER — Ambulatory Visit (HOSPITAL_COMMUNITY)
Admission: RE | Admit: 2018-06-12 | Discharge: 2018-06-12 | Disposition: A | Discharge status: Home / Self Care | Payer: Medicare Other | Source: Ambulatory Visit | Attending: Internal Medicine | Admitting: Internal Medicine

## 2018-06-12 ENCOUNTER — Other Ambulatory Visit (HOSPITAL_COMMUNITY): Payer: Self-pay | Admitting: Internal Medicine

## 2018-06-12 DIAGNOSIS — M5416 Radiculopathy, lumbar region: Secondary | ICD-10-CM

## 2018-06-12 DIAGNOSIS — M5418 Radiculopathy, sacral and sacrococcygeal region: Secondary | ICD-10-CM | POA: Diagnosis not present

## 2018-06-12 DIAGNOSIS — M48061 Spinal stenosis, lumbar region without neurogenic claudication: Secondary | ICD-10-CM | POA: Insufficient documentation

## 2018-06-12 DIAGNOSIS — D259 Leiomyoma of uterus, unspecified: Secondary | ICD-10-CM | POA: Insufficient documentation

## 2018-06-12 DIAGNOSIS — M8938 Hypertrophy of bone, other site: Secondary | ICD-10-CM | POA: Insufficient documentation

## 2018-06-12 DIAGNOSIS — M5126 Other intervertebral disc displacement, lumbar region: Secondary | ICD-10-CM | POA: Insufficient documentation

## 2018-06-26 DIAGNOSIS — M5432 Sciatica, left side: Secondary | ICD-10-CM | POA: Diagnosis not present

## 2018-06-26 DIAGNOSIS — Z23 Encounter for immunization: Secondary | ICD-10-CM | POA: Diagnosis not present

## 2018-06-26 DIAGNOSIS — M5416 Radiculopathy, lumbar region: Secondary | ICD-10-CM | POA: Diagnosis not present

## 2018-08-08 DIAGNOSIS — R030 Elevated blood-pressure reading, without diagnosis of hypertension: Secondary | ICD-10-CM | POA: Diagnosis not present

## 2018-08-08 DIAGNOSIS — M545 Low back pain: Secondary | ICD-10-CM | POA: Diagnosis not present

## 2018-08-09 DIAGNOSIS — M810 Age-related osteoporosis without current pathological fracture: Secondary | ICD-10-CM | POA: Diagnosis not present

## 2018-08-09 DIAGNOSIS — E2839 Other primary ovarian failure: Secondary | ICD-10-CM | POA: Diagnosis not present

## 2018-08-09 DIAGNOSIS — E559 Vitamin D deficiency, unspecified: Secondary | ICD-10-CM | POA: Diagnosis not present

## 2018-08-09 DIAGNOSIS — Z23 Encounter for immunization: Secondary | ICD-10-CM | POA: Diagnosis not present

## 2018-08-09 DIAGNOSIS — M5418 Radiculopathy, sacral and sacrococcygeal region: Secondary | ICD-10-CM | POA: Diagnosis not present

## 2018-08-09 DIAGNOSIS — R6882 Decreased libido: Secondary | ICD-10-CM | POA: Diagnosis not present

## 2018-08-09 DIAGNOSIS — R5383 Other fatigue: Secondary | ICD-10-CM | POA: Diagnosis not present

## 2018-08-30 DIAGNOSIS — M545 Low back pain: Secondary | ICD-10-CM | POA: Diagnosis not present

## 2018-08-30 DIAGNOSIS — M461 Sacroiliitis, not elsewhere classified: Secondary | ICD-10-CM | POA: Diagnosis not present

## 2018-08-30 DIAGNOSIS — M48061 Spinal stenosis, lumbar region without neurogenic claudication: Secondary | ICD-10-CM | POA: Diagnosis not present

## 2018-08-30 DIAGNOSIS — M47816 Spondylosis without myelopathy or radiculopathy, lumbar region: Secondary | ICD-10-CM | POA: Diagnosis not present

## 2018-08-30 DIAGNOSIS — M5126 Other intervertebral disc displacement, lumbar region: Secondary | ICD-10-CM | POA: Diagnosis not present

## 2018-10-02 DIAGNOSIS — L821 Other seborrheic keratosis: Secondary | ICD-10-CM | POA: Diagnosis not present

## 2018-10-02 DIAGNOSIS — D485 Neoplasm of uncertain behavior of skin: Secondary | ICD-10-CM | POA: Diagnosis not present

## 2018-10-02 DIAGNOSIS — L570 Actinic keratosis: Secondary | ICD-10-CM | POA: Diagnosis not present

## 2018-10-10 DIAGNOSIS — M5418 Radiculopathy, sacral and sacrococcygeal region: Secondary | ICD-10-CM | POA: Diagnosis not present

## 2018-10-10 DIAGNOSIS — E559 Vitamin D deficiency, unspecified: Secondary | ICD-10-CM | POA: Diagnosis not present

## 2018-10-10 DIAGNOSIS — M810 Age-related osteoporosis without current pathological fracture: Secondary | ICD-10-CM | POA: Diagnosis not present

## 2018-10-10 DIAGNOSIS — Z1159 Encounter for screening for other viral diseases: Secondary | ICD-10-CM | POA: Diagnosis not present

## 2018-10-10 DIAGNOSIS — E2839 Other primary ovarian failure: Secondary | ICD-10-CM | POA: Diagnosis not present

## 2018-11-27 DIAGNOSIS — M461 Sacroiliitis, not elsewhere classified: Secondary | ICD-10-CM | POA: Diagnosis not present

## 2018-11-27 DIAGNOSIS — M48061 Spinal stenosis, lumbar region without neurogenic claudication: Secondary | ICD-10-CM | POA: Diagnosis not present

## 2018-11-27 DIAGNOSIS — M47816 Spondylosis without myelopathy or radiculopathy, lumbar region: Secondary | ICD-10-CM | POA: Diagnosis not present

## 2018-11-29 ENCOUNTER — Other Ambulatory Visit: Payer: Self-pay

## 2018-11-29 ENCOUNTER — Ambulatory Visit (HOSPITAL_COMMUNITY): Payer: Medicare Other | Attending: Physical Medicine and Rehabilitation | Admitting: Physical Therapy

## 2018-11-29 ENCOUNTER — Encounter (HOSPITAL_COMMUNITY): Payer: Self-pay | Admitting: Physical Therapy

## 2018-11-29 DIAGNOSIS — R29898 Other symptoms and signs involving the musculoskeletal system: Secondary | ICD-10-CM | POA: Diagnosis not present

## 2018-11-29 DIAGNOSIS — M545 Low back pain, unspecified: Secondary | ICD-10-CM

## 2018-11-29 DIAGNOSIS — M6281 Muscle weakness (generalized): Secondary | ICD-10-CM | POA: Insufficient documentation

## 2018-11-29 NOTE — Patient Instructions (Signed)
Piriformis Stretch, Sitting    Sit, one ankle on opposite knee, same-side hand on crossed knee. Push down on knee, keeping spine straight. Lean torso forward, with flat back, until tension is felt in hamstrings and gluteals of crossed-leg side. Hold _30__ seconds.  Repeat _2__ times per session. Do _1__ sessions per day.  Copyright  VHI. All rights reserved.

## 2018-11-29 NOTE — Therapy (Signed)
[place], [place], [address] Phone: [phone]   Fax:  [phone]  Physical Therapy Evaluation  Patient Details  Name: Morgan Fields MRN: [id number] Date of Birth: Mar 15, 1951 Referring Provider (PT): Terrilee Files., MD   Encounter Date: 11/29/2018  PT End of Session - 11/29/18 1507    Visit Number  1    Number of Visits  12    Date for PT Re-Evaluation  12/27/18    Authorization Type  Primary: Medicare; Secondary: T[employer] (PT's ONLY)    Authorization Time Period  11/29/18 - 12/30/18    Authorization - Visit Number  1    Authorization - Number of Visits  10    PT Start Time  1309    PT Stop Time  1415    PT Time Calculation (min)  66 min    Activity Tolerance  Patient tolerated treatment well    Behavior During Therapy  [facility] for tasks assessed/performed       Past Medical History:  Diagnosis Date  . Complication of anesthesia    takes a long time to wake up from anesthesia  . Depression   . PMB (postmenopausal bleeding) 03/11/2016  . Post-menopause on HRT (hormone replacement therapy) 03/11/2016  . Skin cancer    Recent to anterior chest; cervical cancer  . Thickened endometrium 03/16/2016  . Vaginal Pap smear, abnormal     Past Surgical History:  Procedure Laterality Date  . BREAST ENHANCEMENT SURGERY    . CERVICAL BIOPSY     precancerous years ago  . CHOLECYSTECTOMY  2011  . COLONOSCOPY N/A 05/08/2013   Procedure: COLONOSCOPY;  Surgeon: Rogene Houston, MD;  [facility];  Service: Endoscopy;  Laterality: N/A;  200  . HYSTEROSCOPY W/D&C N/A 10/05/2016   Procedure: HYSTEROSCOPY; UTERINE CURETTAGE;  Surgeon: Florian Buff, MD;  Location: [facility] OR[facility];  Service: Gynecology;  Laterality: N/A;  . POLYPECTOMY N/A 10/05/2016   Procedure: REMOVAL OF ENDOMETRIAL POLYP;  Surgeon: Florian Buff, MD;  Location: [facility] OR[facility];  Service: Gynecology;  Laterality: N/A;  . TUBAL LIGATION      There were no  vitals filed for this visit.   Subjective Assessment - 11/29/18 1314    Subjective  Patient reported that she has pain which begins in her lower buttocks and tailbone and then radiates up around her right hip predominantly. She also stated that the pain can radiate up into her upper back at times. Patient reported that in September 2019 the pain was on both sides of her hips and buttocks, but now it is more on the right side. Patient reported that she started doing yoga 4 years ago which She stated that doing hip opener exercises in yoga initially felt to be helping, but then began to irritate and provoke her pain she felt. She stated that she stopped doing yoga in September 2019. She stated that the pain will start following doing 1 activity and prevent her from feeling like she can do anymore activities. She stated she also has a history of a pinched nerve and pain in her right shoulder and occasional tingling or numbness due to this. She stated laying in bed doing nothing relieves her pain, but that she doesn't like to do this. She stated that her tailbone has been cracked or dislocated several times in her life, she stated in the 1960s she initially injured it with a fall, then injured it again in 1971 and  1976 during the birth of her children and then again in the 1990s she fell down the stairs. She stated that she feels she has had a leg length discrepancy for many years. Patient denied any saddle paresthesia. She denied any sudden significant weight loss or weight gain. She denied any current bowel and bladder changes, but she reported that she has a history of IBS and that she had constipation accompanying her severe pain beginning in September and felt as if she could not feel, but this resolved and her physician is aware of this. Patient reported she got cortisone shot in September 2019 which took her pain from a 10/10 to an 8/10. The patient reported a maximum of a 10/10 pain over the last week. She  denied any previous back or hip surgeries. She stated that her pain is limiting her ability to do any activities for a prolonged amount of time.     Limitations  Sitting;Standing;Walking;House hold activities    How long can you sit comfortably?  10 minutes    How long can you stand comfortably?  10 minutes    How long can you walk comfortably?  10-15 minutes and will walk for 45 minutes    Diagnostic tests  MRI for lumbar and sacrum 06/12/18: No acute findings (see imaging for more detail)    Patient Stated Goals  Decrease pain    Currently in Pain?  Yes    Pain Score  3     Pain Location  Buttocks    Pain Orientation  Right    Pain Descriptors / Indicators  Aching    Pain Type  Chronic pain    Pain Radiating Towards  Radiates up towards her shoulder    Pain Onset  More than a month ago    Pain Frequency  Intermittent    Aggravating Factors   Movement walking    Pain Relieving Factors  Laying flat on her back    Effect of Pain on Daily Activities  Severely affects         [facility] PT Assessment - 11/29/18 0001      Assessment   Medical Diagnosis  Sacroilitis    Referring Provider (PT)  Terrilee Files., MD    Onset Date/Surgical Date  --   September 2019   Hand Dominance  Right    Next MD Visit  --   Unknown   Prior Therapy  None      Precautions   Precautions  None      Restrictions   Weight Bearing Restrictions  No      Balance Screen   Has the patient fallen in the past 6 months  No    Has the patient had a decrease in activity level because of a fear of falling?   Yes    Is the patient reluctant to leave their home because of a fear of falling?   Yes      [place] residence    Living Arrangements  Spouse/significant other    Type of [place] to enter    Entrance Stairs-Number of Steps  2    Entrance Stairs-Rails  None    Home Layout  One level    Home Equipment  None      Prior Function   Level  of Independence  Independent with basic ADLs;Independent      Cognition   Overall  Cognitive Status  Within Functional Limits for tasks assessed      Observation/Other Assessments   Observations  Noted right lateral malleoli longer than the right when patient supine. Measurement of legs each 34 inches.     Focus on Therapeutic Outcomes (FOTO)   59% limited      Sensation   Light Touch  Appears Intact      Posture/Postural Control   Posture/Postural Control  Postural limitations    Postural Limitations  Rounded Shoulders;Increased thoracic kyphosis      ROM / Strength   AROM / PROM / Strength  Strength      Strength   Strength Assessment Site  Hip;Knee;Ankle    Right/Left Hip  Right;Left    Right Hip Flexion  5/5    Right Hip Extension  4-/5    Right Hip ABduction  4+/5    Left Hip Flexion  5/5    Left Hip Extension  4-/5    Left Hip ABduction  4+/5    Right/Left Knee  Right;Left    Right Knee Flexion  4+/5    Right Knee Extension  4+/5    Left Knee Flexion  4+/5    Left Knee Extension  4+/5    Right/Left Ankle  Right;Left    Right Ankle Dorsiflexion  5/5    Left Ankle Dorsiflexion  5/5      Flexibility   Soft Tissue Assessment /Muscle Length  yes    Hamstrings  WFL    Piriformis  25% limited      Palpation   Spinal mobility  Genereal hypomobility throughout with PA assessment of central spinous processes thoracic spine through lumbar    Palpation comment  Convex and prominent sacrum, reported tenderness to palpation of PSIS, denied tenderness to palpation through bilateral gluteals. Lumbar and buttocks pain reported with tenderness to palpation with lumbar spinal mobility testing.       Special Tests    Special Tests  Sacrolliac Tests    Sacroiliac Tests   Gaenslen's Test      Pelvic Dictraction   Findings  Negative    Comment  Bilaterally, tender under right hand, but not recreating pain      Pelvic Compression   Findings  Negative    Side  --   Bilaterally      Sacral thrust    Findings  Positive    Side  Right      Gaenslen's test   Findings  Negative    Side   --   Bilaterally               Objective measurements completed on examination: See above findings.              PT Education - 11/29/18 1439    Education Details  Educated patient on examination findings, plan of care, and initial HEP.     Person(s) Educated  Patient    Methods  Explanation;Demonstration;Handout    Comprehension  Verbalized understanding       PT Short Term Goals - 11/29/18 1454      PT SHORT TERM GOAL #1   Title  Patient will report understanding and regular compliance of HEP to improve flexibility, strength, and overall functional mobility.     Time  2    Period  Weeks    Status  New    Target Date  12/13/18      PT SHORT TERM GOAL #2   Title  Patient  will report that her pain in her hip, back, or buttocks has not exceeded a 7/10 over the course of a 1 week period indicating improved tolerance to daily activities.     Time  2    Period  Weeks    Status  New    Target Date  12/13/18        PT Long Term Goals - 11/29/18 1455      PT LONG TERM GOAL #1   Title  Patient will demonstrate improvement of at least 1/2 MMT strength grade in all deficient muscle groups indicating improved strength and stability in her hips and pelvis to decrease pain.     Time  4    Period  Weeks    Status  New    Target Date  12/27/18      PT LONG TERM GOAL #2   Title  Patient will report ability to ambulate for at least 30 minutes without pain exceeding a 2/10 in order for the patient to resume community activity and volunteering without as much pain.     Time  4    Period  Weeks    Status  New    Target Date  12/27/18      PT LONG TERM GOAL #3   Title  Patient will demonstrate improvement of at least 10% on FOTO indicating percieved functional mobility and improved activity tolerance.     Time  4    Period  Weeks    Status  New    Target  Date  12/27/18             Plan - 11/29/18 1509    Clinical Impression Statement  Patient is a 68 year old female who presented to outpatient physical therapy with sacroiliac pain which radiates up her back since September 2019. Upon examination, patient demonstrated decreased strength in bilateral lower extremities particularly in hip stabilizing muscles. She was with noted tightness in piriformis muscles. In addition, patient was positive on some sacral iliac special tests. With palpation, patient reported increased tenderness to palpation with lumbar spinal mobility. On FOTO patient was 59% limited indicating perceived limitations in functional mobility. In addition, patient reported limitations in activity tolerance with sitting, walking, and standing. Patient would benefit from skilled physical therapy in order to address the abovementioned deficits and help patient return to her prior level of function with decreased pain. Therapist is recommending 2 times per week with land based therapy in addition to 1 time per week with aquatic therapy for improved activity tolerance.      Personal Factors and Comorbidities  Comorbidity 3+    Comorbidities  IBS, Depression, Anxiety    Examination-Activity Limitations  Squat;Sit;Stairs;Stand;Transfers    Examination-Participation Restrictions  Church;Volunteer    Stability/Clinical Decision Making  Stable/Uncomplicated    Clinical Decision Making  Low    Rehab Potential  Good    PT Frequency  3x / week    PT Duration  4 weeks    PT Treatment/Interventions  ADLs/Self Care Home Management;Aquatic Therapy;Moist Heat;DME Instruction;Gait training;Stair training;Functional mobility training;Therapeutic activities;Therapeutic exercise;Balance training;Neuromuscular re-education;Patient/family education;Manual techniques;Energy conservation;Taping    PT Next Visit Plan  Review evaluation and goals; Follow up on HEP; MET to correct SI joint perform SI tests as  needed; mat exercises for strengthening; test balance as needed    PT Home Exercise Plan  11/29/18: Seated piriformis stretch 2x30'' 1x/day    Consulted and Agree with Plan of Care  Patient  Patient will benefit from skilled therapeutic intervention in order to improve the following deficits and impairments:  Decreased endurance, Decreased mobility, Difficulty walking, Decreased activity tolerance, Decreased strength, Impaired flexibility, Postural dysfunction, Pain  Visit Diagnosis: Low back pain, unspecified back pain laterality, unspecified chronicity, unspecified whether sciatica present  Muscle weakness (generalized)  Other symptoms and signs involving the musculoskeletal system     Problem List Patient Active Problem List   Diagnosis Date Noted  . Encounter for gynecological examination with Papanicolaou smear of cervix 11/28/2017  . Thickened endometrium 03/16/2016  . PMB (postmenopausal bleeding) 03/11/2016  . Post-menopause on HRT (hormone replacement therapy) 03/11/2016  . Loss of weight 04/15/2013  . Diarrhea 04/15/2013  . Skin cancer 04/15/2013   [name] PT, DPT 4:53 PM, 11/29/18 [place], [place], [address] Phone: [phone]   Fax:  [phone]  Name: Morgan Fields MRN: [id number] Date of Birth: April 26, 1951

## 2018-12-03 ENCOUNTER — Telehealth (HOSPITAL_COMMUNITY): Payer: Self-pay

## 2018-12-03 NOTE — Telephone Encounter (Signed)
L/m for pt to come to our office for therapy and not the [facility]- requested return phone call to confirm. NF 12/03/2018

## 2018-12-04 ENCOUNTER — Encounter (HOSPITAL_COMMUNITY): Payer: Self-pay

## 2018-12-04 ENCOUNTER — Other Ambulatory Visit: Payer: Self-pay

## 2018-12-04 ENCOUNTER — Ambulatory Visit (HOSPITAL_COMMUNITY): Payer: Medicare Other

## 2018-12-04 ENCOUNTER — Ambulatory Visit (HOSPITAL_COMMUNITY): Payer: Medicare Other | Admitting: Physical Therapy

## 2018-12-04 DIAGNOSIS — R29898 Other symptoms and signs involving the musculoskeletal system: Secondary | ICD-10-CM

## 2018-12-04 DIAGNOSIS — M6281 Muscle weakness (generalized): Secondary | ICD-10-CM | POA: Diagnosis not present

## 2018-12-04 DIAGNOSIS — M545 Low back pain, unspecified: Secondary | ICD-10-CM

## 2018-12-04 NOTE — Therapy (Signed)
[place], [place], [address] Phone: [phone]   Fax:  [phone]  Physical Therapy Treatment  Patient Details  Name: Morgan Fields MRN: [id number] Date of Birth: [birth year] Referring Provider (PT): Terrilee Files., MD   Encounter Date: 12/04/2018  PT End of Session - 12/04/18 1447    Visit Number  2    Number of Visits  12    Date for PT Re-Evaluation  12/27/18    Authorization Type  Primary: Medicare; Secondary: T[employer]are (PT's ONLY)    Authorization Time Period  11/29/18 - 12/30/18    Authorization - Visit Number  2    Authorization - Number of Visits  10    PT Start Time  1093    PT Stop Time  1518    PT Time Calculation (min)  41 min    Activity Tolerance  Patient tolerated treatment well    Behavior During Therapy  [facility] for tasks assessed/performed       Past Medical History:  Diagnosis Date  . Complication of anesthesia    takes a long time to wake up from anesthesia  . Depression   . PMB (postmenopausal bleeding) 03/11/2016  . Post-menopause on HRT (hormone replacement therapy) 03/11/2016  . Skin cancer    Recent to anterior chest; cervical cancer  . Thickened endometrium 03/16/2016  . Vaginal Pap smear, abnormal     Past Surgical History:  Procedure Laterality Date  . BREAST ENHANCEMENT SURGERY    . CERVICAL BIOPSY     precancerous years ago  . CHOLECYSTECTOMY  2011  . COLONOSCOPY N/A 05/08/2013   Procedure: COLONOSCOPY;  Surgeon: Rogene Houston, MD;  [facility];  Service: Endoscopy;  Laterality: N/A;  200  . HYSTEROSCOPY W/D&C N/A 10/05/2016   Procedure: HYSTEROSCOPY; UTERINE CURETTAGE;  Surgeon: Florian Buff, MD;  Location: [facility];  Service: Gynecology;  Laterality: N/A;  . POLYPECTOMY N/A 10/05/2016   Procedure: REMOVAL OF ENDOMETRIAL POLYP;  Surgeon: Florian Buff, MD;  Location: [facility];  Service: Gynecology;  Laterality: N/A;  . TUBAL LIGATION      There were no  vitals filed for this visit.  Subjective Assessment - 12/04/18 1440    Subjective  Patient reports she has not been walking much for the last 2 weeks and she believes that has really improved her lower back and hip pain. She reports she has been able to do more the last few days around her home. She also has been doing her exercises every day and feels the stretch is helping.     Limitations  Sitting;Standing;Walking;House hold activities    How long can you sit comfortably?  10 minutes    How long can you stand comfortably?  10 minutes    How long can you walk comfortably?  10-15 minutes and will walk for 45 minutes    Diagnostic tests  MRI for lumbar and sacrum 06/12/18: No acute findings (see imaging for more detail)    Patient Stated Goals  Decrease pain    Currently in Pain?  Yes    Pain Score  1     Pain Location  Back    Pain Orientation  Right;Lower    Pain Descriptors / Indicators  Aching    Pain Type  Chronic pain    Pain Onset  More than a month ago    Pain Frequency  Intermittent    Aggravating Factors   walking  Pain Relieving Factors  laying flat    Effect of Pain on Daily Activities  severe        [facility] Adult PT Treatment/Exercise - 12/04/18 0001      Exercises   Exercises  Lumbar      Lumbar Exercises: Stretches   Piriformis Stretch  Right;Left;30 seconds;2 reps    Piriformis Stretch Limitations  long sit with cross over    Figure 4 Stretch  2 reps;30 seconds;With overpressure;Supine    Other Lumbar Stretch Exercise  Wall arch: 1x 10 reps, 3 sec holds      Lumbar Exercises: Supine   Bridge  10 reps;3 seconds      Lumbar Exercises: Sidelying   Hip Abduction  Both;10 reps;3 seconds    Hip Abduction Limitations  2 sets    Other Sidelying Lumbar Exercises  Sidelying plank: Rt/Lt 3x 20 seconds      Lumbar Exercises: Quadruped   Opposite Arm/Leg Raise  Right arm/Left leg;Left arm/Right leg;10 reps    Plank  3x 20 seconds front plank        PT Education -  12/04/18 1447    Education Details  Educated on exercise throughout session and updated HEP.     Person(s) Educated  Patient    Methods  Explanation;Handout    Comprehension  Verbalized understanding       PT Short Term Goals - 12/04/18 1443      PT SHORT TERM GOAL #1   Title  Patient will report understanding and regular compliance of HEP to improve flexibility, strength, and overall functional mobility.     Time  2    Period  Weeks    Status  New    Target Date  12/13/18      PT SHORT TERM GOAL #2   Title  Patient will report that her pain in her hip, back, or buttocks has not exceeded a 7/10 over the course of a 1 week period indicating improved tolerance to daily activities.     Time  2    Period  Weeks    Status  On-going    Target Date  12/13/18      PT SHORT TERM GOAL #3   Status  On-going        PT Long Term Goals - 12/04/18 1600      PT LONG TERM GOAL #1   Title  Patient will demonstrate improvement of at least 1/2 MMT strength grade in all deficient muscle groups indicating improved strength and stability in her hips and pelvis to decrease pain.     Time  4    Period  Weeks    Status  On-going      PT LONG TERM GOAL #2   Title  Patient will report ability to ambulate for at least 30 minutes without pain exceeding a 2/10 in order for the patient to resume community activity and volunteering without as much pain.     Time  4    Period  Weeks    Status  On-going      PT LONG TERM GOAL #3   Title  Patient will demonstrate improvement of at least 10% on FOTO indicating percieved functional mobility and improved activity tolerance.     Time  4    Period  Weeks    Status  On-going        Plan - 12/04/18 1445    Clinical Impression Statement  Session began with review of goals  and HEP. Initiated therapeutic exercise to target hip strengthening and core stability. Patient demonstrated good form with all exercises requiring minimal cuing for hip abduction to  encourage hip ext/abd and deter hip flexion. She maintained neutral spine throughout front planks and side planks, therefore exercise were added to HEP. Hip mobility stretches were progressed with overpressure as well and patient denied pain/discomfort throghout. She will continue to benefit from skilled PT interventions to address impairments and progress towards goals.     Personal Factors and Comorbidities  Comorbidity 3+    Comorbidities  IBS, Depression, Anxiety    Examination-Activity Limitations  Squat;Sit;Stairs;Stand;Transfers    Examination-Participation Restrictions  Church;Volunteer    Stability/Clinical Decision Making  Stable/Uncomplicated    Rehab Potential  Good    PT Frequency  3x / week    PT Duration  4 weeks    PT Treatment/Interventions  ADLs/Self Care Home Management;Aquatic Therapy;Moist Heat;DME Instruction;Gait training;Stair training;Functional mobility training;Therapeutic activities;Therapeutic exercise;Balance training;Neuromuscular re-education;Patient/family education;Manual techniques;Energy conservation;Taping    PT Next Visit Plan  Follow up on HEP; MET to correct SI joint perform SI tests as needed; mat exercises for strengthening; test balance as needed    PT Home Exercise Plan  11/29/18: Seated piriformis stretch 2x30'' 1x/day; 12/04/18 - front/side plank;     Consulted and Agree with Plan of Care  Patient       Patient will benefit from skilled therapeutic intervention in order to improve the following deficits and impairments:  Decreased endurance, Decreased mobility, Difficulty walking, Decreased activity tolerance, Decreased strength, Impaired flexibility, Postural dysfunction, Pain  Visit Diagnosis: Low back pain, unspecified back pain laterality, unspecified chronicity, unspecified whether sciatica present  Muscle weakness (generalized)  Other symptoms and signs involving the musculoskeletal system     Problem List Patient Active Problem List    Diagnosis Date Noted  . Encounter for gynecological examination with Papanicolaou smear of cervix 11/28/2017  . Thickened endometrium 03/16/2016  . PMB (postmenopausal bleeding) 03/11/2016  . Post-menopause on HRT (hormone replacement therapy) 03/11/2016  . Loss of weight 04/15/2013  . Diarrhea 04/15/2013  . Skin cancer 04/15/2013    Kipp Brood, PT, DPT, [facility] Physical Therapist with [facility]  12/04/2018 3:17 PM    [place] [address] [place], [place], [address] Phone: [phone]   Fax:  [phone]  Name: Morgan Fields MRN: [id number] Date of Birth: 1950/10/13

## 2018-12-04 NOTE — Patient Instructions (Signed)
Side Plank on Knees reps: 3 sets: 1 hold: 20 seconds daily: 1  weekly: 7      Exercise image step 1   Exercise image step 2  Setup  Begin lying on your side with your knees bent, propped up on your forearm. Your elbow should be directly under your shoulder. Movement  Engage your abdominal muscles and raise your hips up into a side plank position, keeping your knees on the ground. Hold this position, then return to the starting position and repeat. Tip  Make sure to keep your core engaged during the exercise. Do not hold your breath or let your hips roll forward, backward, or drop towards the floor. Standard Plank reps: 3 sets: 1 hold: 20 seconds daily: 1  weekly: 7      Exercise image step 1   Exercise image step 2  Setup  Begin lying on your front, propped up on your elbows.  Movement  Engage your abdominal muscles and lift your hips and legs up into a plank position, keeping your elbows directly under your shoulders. Hold this position.  Tip  Make sure to keep your back straight and maintain a gentle chin tuck during the exercise.

## 2018-12-06 ENCOUNTER — Ambulatory Visit (HOSPITAL_COMMUNITY): Payer: Medicare Other | Admitting: Physical Therapy

## 2018-12-06 ENCOUNTER — Other Ambulatory Visit: Payer: Self-pay

## 2018-12-06 ENCOUNTER — Encounter (HOSPITAL_COMMUNITY): Payer: Self-pay | Admitting: Physical Therapy

## 2018-12-06 DIAGNOSIS — M6281 Muscle weakness (generalized): Secondary | ICD-10-CM

## 2018-12-06 DIAGNOSIS — M545 Low back pain, unspecified: Secondary | ICD-10-CM

## 2018-12-06 DIAGNOSIS — R29898 Other symptoms and signs involving the musculoskeletal system: Secondary | ICD-10-CM | POA: Diagnosis not present

## 2018-12-06 NOTE — Therapy (Signed)
[place], [place], [address] Phone: [phone]   Fax:  [phone]  Physical Therapy Treatment  Patient Details  Name: Morgan Fields MRN: [id number] Date of Birth: [birth year] Referring Provider (PT): Terrilee Files., MD   Encounter Date: 12/06/2018  PT End of Session - 12/06/18 1343    Visit Number  3    Number of Visits  12    Date for PT Re-Evaluation  12/27/18    Authorization Type  Primary: Medicare; Secondary: T[employer]are (PT's ONLY)    Authorization Time Period  11/29/18 - 12/30/18    Authorization - Visit Number  3    Authorization - Number of Visits  10    PT Start Time  7169    PT Stop Time  1343    PT Time Calculation (min)  38 min    Activity Tolerance  Patient tolerated treatment well    Behavior During Therapy  [facility] for tasks assessed/performed       Past Medical History:  Diagnosis Date  . Complication of anesthesia    takes a long time to wake up from anesthesia  . Depression   . PMB (postmenopausal bleeding) 03/11/2016  . Post-menopause on HRT (hormone replacement therapy) 03/11/2016  . Skin cancer    Recent to anterior chest; cervical cancer  . Thickened endometrium 03/16/2016  . Vaginal Pap smear, abnormal     Past Surgical History:  Procedure Laterality Date  . BREAST ENHANCEMENT SURGERY    . CERVICAL BIOPSY     precancerous years ago  . CHOLECYSTECTOMY  2011  . COLONOSCOPY N/A 05/08/2013   Procedure: COLONOSCOPY;  Surgeon: Rogene Houston, MD;  Location: [facility];  Service: Endoscopy;  Laterality: N/A;  200  . HYSTEROSCOPY W/D&C N/A 10/05/2016   Procedure: HYSTEROSCOPY; UTERINE CURETTAGE;  Surgeon: Florian Buff, MD;  Location: [facility] ORS;  Service: Gynecology;  Laterality: N/A;  . POLYPECTOMY N/A 10/05/2016   Procedure: REMOVAL OF ENDOMETRIAL POLYP;  Surgeon: Florian Buff, MD;  Location: [facility] ORS;  Service: Gynecology;  Laterality: N/A;  . TUBAL LIGATION      There were no  vitals filed for this visit.  Subjective Assessment - 12/06/18 1307    Subjective  Patient reported that she feels good today, she stated that yesterday she had pain in her shoulder.     Limitations  Sitting;Standing;Walking;House hold activities    How long can you sit comfortably?  10 minutes    How long can you stand comfortably?  10 minutes    How long can you walk comfortably?  10-15 minutes and will walk for 45 minutes    Diagnostic tests  MRI for lumbar and sacrum 06/12/18: No acute findings (see imaging for more detail)    Patient Stated Goals  Decrease pain    Currently in Pain?  No/denies    Pain Onset  More than a month ago                       [facility] Adult PT Treatment/Exercise - 12/06/18 0001      Lumbar Exercises: Stretches   Piriformis Stretch  Right;Left;30 seconds;2 reps    Piriformis Stretch Limitations  long sit with cross over    Figure 4 Stretch  2 reps;30 seconds;With overpressure;Supine    Other Lumbar Stretch Exercise  Wall arch: 1x 10 reps, 3 sec holds      Lumbar Exercises: Standing  Functional Squats  15 reps    Functional Squats Limitations  chair behind for cue      Lumbar Exercises: Supine   Bridge  10 reps;3 seconds      Lumbar Exercises: Sidelying   Hip Abduction  Both;10 reps;3 seconds      Lumbar Exercises: Quadruped   Opposite Arm/Leg Raise  Right arm/Left leg;Left arm/Right leg;10 reps    Plank  3x 30 seconds front plank             PT Education - 12/06/18 1340    Education Details  Discussed slow progression of walking.    Person(s) Educated  Patient    Methods  Explanation    Comprehension  Verbalized understanding       PT Short Term Goals - 12/04/18 1443      PT SHORT TERM GOAL #1   Title  Patient will report understanding and regular compliance of HEP to improve flexibility, strength, and overall functional mobility.     Time  2    Period  Weeks    Status  New    Target Date  12/13/18      PT SHORT  TERM GOAL #2   Title  Patient will report that her pain in her hip, back, or buttocks has not exceeded a 7/10 over the course of a 1 week period indicating improved tolerance to daily activities.     Time  2    Period  Weeks    Status  On-going    Target Date  12/13/18      PT SHORT TERM GOAL #3   Status  On-going        PT Long Term Goals - 12/04/18 1600      PT LONG TERM GOAL #1   Title  Patient will demonstrate improvement of at least 1/2 MMT strength grade in all deficient muscle groups indicating improved strength and stability in her hips and pelvis to decrease pain.     Time  4    Period  Weeks    Status  On-going      PT LONG TERM GOAL #2   Title  Patient will report ability to ambulate for at least 30 minutes without pain exceeding a 2/10 in order for the patient to resume community activity and volunteering without as much pain.     Time  4    Period  Weeks    Status  On-going      PT LONG TERM GOAL #3   Title  Patient will demonstrate improvement of at least 10% on FOTO indicating percieved functional mobility and improved activity tolerance.     Time  4    Period  Weeks    Status  On-going            Plan - 12/06/18 1343    Clinical Impression Statement  Continued with established plan of care this session. This session began functional squats with abdominal activation. Discussed with patient monitoring how her shoulder is affected during plank exercises and discontinuing if it is agitated. Plan to continue progressing patient towards reaching functional goals.      Personal Factors and Comorbidities  Comorbidity 3+    Comorbidities  IBS, Depression, Anxiety    Examination-Activity Limitations  Squat;Sit;Stairs;Stand;Transfers    Examination-Participation Restrictions  Church;Volunteer    Stability/Clinical Decision Making  Stable/Uncomplicated    Rehab Potential  Good    PT Frequency  3x / week    PT  Duration  4 weeks    PT Treatment/Interventions   ADLs/Self Care Home Management;Aquatic Therapy;Moist Heat;DME Instruction;Gait training;Stair training;Functional mobility training;Therapeutic activities;Therapeutic exercise;Balance training;Neuromuscular re-education;Patient/family education;Manual techniques;Energy conservation;Taping    PT Next Visit Plan  Follow up on HEP; MET to correct SI joint perform SI tests as needed; mat exercises for strengthening; test balance as needed    PT Home Exercise Plan  11/29/18: Seated piriformis stretch 2x30'' 1x/day; 12/04/18 - front/side plank;     Consulted and Agree with Plan of Care  Patient       Patient will benefit from skilled therapeutic intervention in order to improve the following deficits and impairments:  Decreased endurance, Decreased mobility, Difficulty walking, Decreased activity tolerance, Decreased strength, Impaired flexibility, Postural dysfunction, Pain  Visit Diagnosis: Low back pain, unspecified back pain laterality, unspecified chronicity, unspecified whether sciatica present  Muscle weakness (generalized)  Other symptoms and signs involving the musculoskeletal system     Problem List Patient Active Problem List   Diagnosis Date Noted  . Encounter for gynecological examination with Papanicolaou smear of cervix 11/28/2017  . Thickened endometrium 03/16/2016  . PMB (postmenopausal bleeding) 03/11/2016  . Post-menopause on HRT (hormone replacement therapy) 03/11/2016  . Loss of weight 04/15/2013  . Diarrhea 04/15/2013  . Skin cancer 04/15/2013   [name] PT, DPT 1:45 PM, 12/06/18 [place], [place], [address] Phone: [phone]   Fax:  [phone]  Name: Morgan Fields MRN: [id number] Date of Birth: [birth year]

## 2018-12-07 ENCOUNTER — Telehealth (HOSPITAL_COMMUNITY): Payer: Self-pay

## 2018-12-07 ENCOUNTER — Encounter (INDEPENDENT_AMBULATORY_CARE_PROVIDER_SITE_OTHER): Payer: Self-pay | Admitting: Internal Medicine

## 2018-12-07 NOTE — Telephone Encounter (Signed)
I called Ms. Grissinger at her home number on file. Her husband answered and stated she was napping. I informed him that our office is closing for 2 weeks to protect the safety and well being of our patients and staff. I told him our tentative re-open date is 12/24/18 and that the patient has an appointment scheduled for 12/25/18 and several following that we will keep. Her husband thinks she would be happy to have a weekly phone call to discuss how her HEP is going and check in with her. He also believes she would like to have additional exercises and provided an email to send them to: [email or web address]  Kipp Brood, PT, DPT, [facility] Physical Therapist with [facility]  12/07/2018 2:12 PM

## 2018-12-10 DIAGNOSIS — R6882 Decreased libido: Secondary | ICD-10-CM | POA: Diagnosis not present

## 2018-12-10 DIAGNOSIS — M810 Age-related osteoporosis without current pathological fracture: Secondary | ICD-10-CM | POA: Diagnosis not present

## 2018-12-10 DIAGNOSIS — E2839 Other primary ovarian failure: Secondary | ICD-10-CM | POA: Diagnosis not present

## 2018-12-11 ENCOUNTER — Encounter (HOSPITAL_COMMUNITY): Admitting: Physical Therapy

## 2018-12-11 ENCOUNTER — Ambulatory Visit (HOSPITAL_COMMUNITY): Payer: Medicare Other

## 2018-12-12 ENCOUNTER — Ambulatory Visit (HOSPITAL_COMMUNITY): Payer: Medicare Other | Admitting: Physical Therapy

## 2018-12-13 ENCOUNTER — Encounter (HOSPITAL_COMMUNITY): Admitting: Physical Therapy

## 2018-12-13 ENCOUNTER — Ambulatory Visit (HOSPITAL_COMMUNITY): Payer: Medicare Other | Admitting: Physical Therapy

## 2018-12-13 DIAGNOSIS — E559 Vitamin D deficiency, unspecified: Secondary | ICD-10-CM | POA: Diagnosis not present

## 2018-12-13 DIAGNOSIS — R6882 Decreased libido: Secondary | ICD-10-CM | POA: Diagnosis not present

## 2018-12-13 DIAGNOSIS — R5383 Other fatigue: Secondary | ICD-10-CM | POA: Diagnosis not present

## 2018-12-14 ENCOUNTER — Telehealth (HOSPITAL_COMMUNITY): Payer: Self-pay | Admitting: Physical Therapy

## 2018-12-14 NOTE — Telephone Encounter (Signed)
Therapist called to check in on the patient. The patient reported that she has been doing really well. She stated that she's been doing her exercises at home except for the side planks and that she has been able to walk without pain for around 30 minutes or more. She stated she is very happy with her progress. Therapist reminded her that the plan was to re-open the clinic on 12/24/18, but that someone would call her to let her know if something changed.   [name] PT, DPT 2:43 PM, 12/14/18 [phone]

## 2018-12-17 DIAGNOSIS — E2839 Other primary ovarian failure: Secondary | ICD-10-CM | POA: Diagnosis not present

## 2018-12-18 ENCOUNTER — Encounter (HOSPITAL_COMMUNITY): Admitting: Physical Therapy

## 2018-12-18 ENCOUNTER — Ambulatory Visit (HOSPITAL_COMMUNITY): Payer: Medicare Other

## 2018-12-19 ENCOUNTER — Encounter (HOSPITAL_COMMUNITY): Payer: Medicare Other | Admitting: Physical Therapy

## 2018-12-20 ENCOUNTER — Encounter (HOSPITAL_COMMUNITY): Admitting: Physical Therapy

## 2018-12-20 ENCOUNTER — Telehealth (HOSPITAL_COMMUNITY): Payer: Self-pay

## 2018-12-20 ENCOUNTER — Encounter (HOSPITAL_COMMUNITY): Payer: Medicare Other | Admitting: Physical Therapy

## 2018-12-20 NOTE — Telephone Encounter (Signed)
Attempted to call and speak to pt or leave voicemail to inform her that our clinic is cancelling all future appointments until further notice due to COVID-19, however, no answer or answering machine setup. Will attempt to call back at a later time/day as appropriate.   Geraldine Solar PT, DPT

## 2018-12-21 ENCOUNTER — Telehealth (HOSPITAL_COMMUNITY): Payer: Self-pay | Admitting: Physical Therapy

## 2018-12-21 NOTE — Telephone Encounter (Signed)
Morgan Fields was contacted today regarding temporary reduction of Outpatient Rehabilitation Services at [facility] due to concerns for community transmission of COVID-19.  Patient identity was verified.  Assessed if patient needed to be seen in person by clinician (recent fall or acute injury that requires hands on assessment and advice, change in diet order, post-surgical, special cases, etc.).  Patient reports that she had continued to do well and has increased walking to about 60 minutes.   Patient did not have an acute/special need that requires in person visit. Proceeded with phone call.  Therapist advised the patient to continue to perform his/her HEP and assured he/she had no unanswered questions or concerns at this time.  The patient expressed interest in being contacted for an E-Visit, virtual check in, or Telehealth visit to continue their plan of care, when those services become available.  Outpatient Rehabilitation Services at [facility] will follow up with patient at that time.  Patient is aware we can be reached by telephone during limited business hours in the meantime.    [name] PT, DPT 9:38 AM, 12/21/18 [phone]

## 2018-12-25 ENCOUNTER — Ambulatory Visit (HOSPITAL_COMMUNITY): Payer: Medicare Other

## 2018-12-26 ENCOUNTER — Encounter (HOSPITAL_COMMUNITY): Payer: Medicare Other | Admitting: Physical Therapy

## 2018-12-27 ENCOUNTER — Encounter (HOSPITAL_COMMUNITY): Payer: Medicare Other | Admitting: Physical Therapy

## 2018-12-31 ENCOUNTER — Telehealth (HOSPITAL_COMMUNITY): Payer: Self-pay | Admitting: Physical Therapy

## 2019-01-02 ENCOUNTER — Telehealth (HOSPITAL_COMMUNITY): Payer: Self-pay

## 2019-01-02 ENCOUNTER — Encounter (HOSPITAL_COMMUNITY): Payer: Medicare Other | Admitting: Physical Therapy

## 2019-01-02 NOTE — Telephone Encounter (Signed)
I called and spoke with Morgan Fields. She is doing well and feels her exercises are going well and that she has enough to keep her going currently. She informed me she was unable to connect with her MD for a virtual telehealth visit this past week. She would like the weekly phone calls to check in and I informed her that if she has any questions, concerns, or anything changes that she can call our office.   Kipp Brood, PT, DPT, [facility] Physical Therapist with [facility]  01/02/2019 4:31 PM

## 2019-01-03 ENCOUNTER — Telehealth (HOSPITAL_COMMUNITY): Payer: Self-pay | Admitting: Internal Medicine

## 2019-01-03 ENCOUNTER — Encounter (HOSPITAL_COMMUNITY): Payer: Medicare Other | Admitting: Physical Therapy

## 2019-01-03 ENCOUNTER — Telehealth (HOSPITAL_COMMUNITY): Payer: Self-pay

## 2019-01-03 NOTE — Telephone Encounter (Signed)
01/03/19  I called patient's secondary insurance, Tricare, and they DO NOT cover Telehealth visits.  Waiting on [name] to look at patient's chart to see if she needs visits in the clinic before calling her to let her know about insurance.

## 2019-01-03 NOTE — Telephone Encounter (Signed)
PT informed pt that neither her primary nor secondary insurance will cover telehealth sessions and she reinformed PT that her computer wouldn't connect anyways. No questions or concerns about HEP. Stated she was fine to be taken off the weekly call list since she is doing so well. She knows that she can call us with any questions or concerns if needed but otherwise, our clinic will contact her once we reopen and she verbalized understanding.  Morgan Fields PT, DPT

## 2019-01-09 ENCOUNTER — Encounter (HOSPITAL_COMMUNITY): Payer: Medicare Other | Admitting: Physical Therapy

## 2019-01-10 ENCOUNTER — Encounter (HOSPITAL_COMMUNITY): Payer: Medicare Other | Admitting: Physical Therapy

## 2019-01-29 ENCOUNTER — Telehealth (HOSPITAL_COMMUNITY): Payer: Self-pay | Admitting: Physical Therapy

## 2019-01-29 NOTE — Telephone Encounter (Signed)
Pt was contacted today by phone regarding resumption of therapy services following our temporary closure secondary to COVID-19.  Patient identity was verified  Pt states she is doing well at this time and feels she no longer requires our services.  Request to be discharged.   Teena Irani, PTA/CLT [phone]

## 2019-03-01 ENCOUNTER — Encounter (HOSPITAL_COMMUNITY): Payer: Self-pay | Admitting: Physical Therapy

## 2019-03-01 NOTE — Therapy (Signed)
[place], [place], [address] Phone: [phone]   Fax:  [phone]  Patient Details  Name: Morgan Fields MRN: [id number] Date of Birth: August 30, 1951 Referring Provider:  No ref. provider found  Encounter Date: 03/01/2019   PHYSICAL THERAPY DISCHARGE SUMMARY  Visits from Start of Care: 3  Current functional level related to goals / functional outcomes: Unknown as patient did not return for re-assessment. However, upon speaking to patient over the phone she had reported improvements in overall functional mobility and ability to ambulate.    Remaining deficits: Unknown as patient did not return for re-assessment. However, upon speaking to patient over the phone she had reported improvements in overall functional mobility and ability to ambulate.    Education / Equipment: Patient had been educated on a HEP.  Plan: Patient agrees to discharge.  Patient goals were not met. Patient is being discharged due to being pleased with the current functional level.  ?????         [name] PT, DPT 4:19 PM, 03/01/19 [place], [place], [address] Phone: [phone]   Fax:  [phone]

## 2019-03-25 ENCOUNTER — Telehealth: Payer: Self-pay | Admitting: Internal Medicine

## 2019-03-25 ENCOUNTER — Other Ambulatory Visit: Payer: Medicare Other

## 2019-03-25 DIAGNOSIS — Z20822 Contact with and (suspected) exposure to covid-19: Secondary | ICD-10-CM

## 2019-03-25 DIAGNOSIS — R6889 Other general symptoms and signs: Secondary | ICD-10-CM | POA: Diagnosis not present

## 2019-03-25 NOTE — Telephone Encounter (Signed)
Pt scheduled for covid 19 testing today at [facility] site. Requested by Dr. Hurshel Party  Possible exposure  Testing process reviewed, stay in car, wear mask. Pt verbalizes understanding  Pts CB# [phone]

## 2019-03-30 LAB — NOVEL CORONAVIRUS, NAA: SARS-CoV-2, NAA: NOT DETECTED

## 2019-04-02 DIAGNOSIS — L570 Actinic keratosis: Secondary | ICD-10-CM | POA: Diagnosis not present

## 2019-04-02 DIAGNOSIS — L821 Other seborrheic keratosis: Secondary | ICD-10-CM | POA: Diagnosis not present

## 2019-04-02 DIAGNOSIS — Z85828 Personal history of other malignant neoplasm of skin: Secondary | ICD-10-CM | POA: Diagnosis not present

## 2019-04-11 ENCOUNTER — Ambulatory Visit (INDEPENDENT_AMBULATORY_CARE_PROVIDER_SITE_OTHER): Payer: Medicare Other | Admitting: Internal Medicine

## 2019-04-11 DIAGNOSIS — F331 Major depressive disorder, recurrent, moderate: Secondary | ICD-10-CM | POA: Diagnosis not present

## 2019-04-11 DIAGNOSIS — R6882 Decreased libido: Secondary | ICD-10-CM | POA: Diagnosis not present

## 2019-04-11 DIAGNOSIS — M810 Age-related osteoporosis without current pathological fracture: Secondary | ICD-10-CM | POA: Diagnosis not present

## 2019-04-11 DIAGNOSIS — E2839 Other primary ovarian failure: Secondary | ICD-10-CM | POA: Diagnosis not present

## 2019-04-11 DIAGNOSIS — E559 Vitamin D deficiency, unspecified: Secondary | ICD-10-CM | POA: Diagnosis not present

## 2019-06-11 ENCOUNTER — Other Ambulatory Visit (INDEPENDENT_AMBULATORY_CARE_PROVIDER_SITE_OTHER): Payer: Self-pay | Admitting: Internal Medicine

## 2019-06-11 ENCOUNTER — Telehealth (INDEPENDENT_AMBULATORY_CARE_PROVIDER_SITE_OTHER): Payer: Self-pay | Admitting: Internal Medicine

## 2019-06-11 MED ORDER — NONFORMULARY OR COMPOUNDED ITEM: 5.0000 mg | 3 refills | Status: DC

## 2019-06-11 NOTE — Telephone Encounter (Signed)
Done

## 2019-06-20 ENCOUNTER — Other Ambulatory Visit: Payer: Self-pay

## 2019-06-20 ENCOUNTER — Ambulatory Visit (INDEPENDENT_AMBULATORY_CARE_PROVIDER_SITE_OTHER): Payer: Medicare Other

## 2019-07-08 ENCOUNTER — Other Ambulatory Visit (INDEPENDENT_AMBULATORY_CARE_PROVIDER_SITE_OTHER): Payer: Self-pay | Admitting: Internal Medicine

## 2019-07-08 ENCOUNTER — Telehealth (INDEPENDENT_AMBULATORY_CARE_PROVIDER_SITE_OTHER): Payer: Self-pay

## 2019-07-08 MED ORDER — ESTRADIOL 1 MG PO TABS: 1.5000 mg | ORAL_TABLET | Freq: Every day | ORAL | 3 refills | Status: DC

## 2019-07-09 NOTE — Telephone Encounter (Signed)
Refill was granted.

## 2019-07-15 ENCOUNTER — Telehealth (INDEPENDENT_AMBULATORY_CARE_PROVIDER_SITE_OTHER): Payer: Self-pay

## 2019-07-15 ENCOUNTER — Other Ambulatory Visit (INDEPENDENT_AMBULATORY_CARE_PROVIDER_SITE_OTHER): Payer: Self-pay | Admitting: Internal Medicine

## 2019-07-15 MED ORDER — ESTRADIOL 1 MG PO TABS: 1.5000 mg | ORAL_TABLET | Freq: Every day | ORAL | 0 refills | Status: DC

## 2019-07-15 NOTE — Telephone Encounter (Signed)
Was sent to dr. Anastasio Champion

## 2019-07-22 ENCOUNTER — Other Ambulatory Visit: Payer: Self-pay

## 2019-07-22 ENCOUNTER — Encounter (INDEPENDENT_AMBULATORY_CARE_PROVIDER_SITE_OTHER): Payer: Self-pay | Admitting: Internal Medicine

## 2019-07-22 ENCOUNTER — Ambulatory Visit (INDEPENDENT_AMBULATORY_CARE_PROVIDER_SITE_OTHER): Payer: Medicare Other | Admitting: Internal Medicine

## 2019-07-22 VITALS — BP 130/70 | HR 72 | Ht 63.25 in | Wt 123.2 lb

## 2019-07-22 DIAGNOSIS — M810 Age-related osteoporosis without current pathological fracture: Secondary | ICD-10-CM

## 2019-07-22 DIAGNOSIS — Z79890 Hormone replacement therapy (postmenopausal): Secondary | ICD-10-CM

## 2019-07-22 DIAGNOSIS — Z1231 Encounter for screening mammogram for malignant neoplasm of breast: Secondary | ICD-10-CM | POA: Diagnosis not present

## 2019-07-22 DIAGNOSIS — E559 Vitamin D deficiency, unspecified: Secondary | ICD-10-CM | POA: Diagnosis not present

## 2019-07-22 DIAGNOSIS — E785 Hyperlipidemia, unspecified: Secondary | ICD-10-CM

## 2019-07-22 DIAGNOSIS — E782 Mixed hyperlipidemia: Secondary | ICD-10-CM

## 2019-07-22 HISTORY — DX: Hyperlipidemia, unspecified: E78.5

## 2019-07-22 HISTORY — DX: Vitamin D deficiency, unspecified: E55.9

## 2019-07-22 HISTORY — DX: Age-related osteoporosis without current pathological fracture: M81.0

## 2019-07-22 NOTE — Progress Notes (Signed)
Metrics: Intervention Frequency ACO  Documented Smoking Status Yearly  Screened one or more times in 24 months  Cessation Counseling or  Active cessation medication Past 24 months  Past 24 months   Guideline developer: UpToDate (See UpToDate for funding source) Date Released: 2014       Wellness Office Visit  Subjective:  Patient ID: Morgan Fields, female    DOB: [birth year]  Age: 68 y.o. MRN: [id number]  CC: This delightful lady comes in for follow-up of osteoporosis, hormone replacement therapy, vitamin D deficiency. HPI  She is doing very well and tolerated eventually the increased dose of estradiol to 3 mg daily.  She feels that she has a sense of wellbeing now compared to before.  She is sleeping well. She continues on vitamin D3 5000 units daily. She continues on testosterone therapy as before without any problems. She tries to keep her self active. Past Medical History:  Diagnosis Date  . Complication of anesthesia    takes a long time to wake up from anesthesia  . Depression   . HLD (hyperlipidemia) 07/22/2019  . Osteoporosis 07/22/2019  . PMB (postmenopausal bleeding) 03/11/2016  . Post-menopause on HRT (hormone replacement therapy) 03/11/2016  . Skin cancer    Recent to anterior chest; cervical cancer  . Thickened endometrium 03/16/2016  . Vaginal Pap smear, abnormal   . Vitamin D deficiency disease 07/22/2019      Family History  Problem Relation Age of Onset  . Alcohol abuse Father   . Mental illness Brother   . Heart attack Paternal Grandfather   . Heart disease Paternal Grandfather   . Diabetes Paternal Grandfather   . Polycystic ovary syndrome Daughter   . Other Daughter        knee replacement  . Thyroid disease Daughter        had radiation on thyroid  . Cancer Maternal Grandmother        colon  . Diabetes Maternal Grandmother   . Heart attack Brother     Social History   Social History Narrative   Married for 49 years.Retired,used to work at  [employer].   Social History   Tobacco Use  . Smoking status: Current Every Day Smoker    Packs/day: 0.50    Years: 33.00    Pack years: 16.50    Types: Cigarettes  . Smokeless tobacco: Never Used  . Tobacco comment: smokes 3 cig daily  Substance Use Topics  . Alcohol use: Yes    Alcohol/week: 12.0 standard drinks    Types: 12 Cans of beer per week    Comment: wine every evening    Current Meds  Medication Sig  . Cholecalciferol (VITAMIN D-3) 125 MCG (5000 UT) TABS Take 1 tablet by mouth daily.  . cyanocobalamin 100 MCG tablet Take 100 mcg by mouth daily.  Marland Kitchen estradiol (ESTRACE) 2 MG tablet Take 3 mg by mouth daily.  . NONFORMULARY OR COMPOUNDED ITEM Inject 5 mg into the muscle 2 (two) times a week. Testosterone cypionate in olive  oil (25 mg/mL).  Dispense 5 mL vial.  . progesterone (PROMETRIUM) 200 MG capsule TAKE 1 CAPSULE DAILY     Nutrition  She tries to eat healthy overall. Sleep  Adequate, uninterrupted sleep.  Exercise  She walks on a regular basis. Bio Identical Hormones  Testosterone therapy is being used off label for symptoms of testosterone deficiency and benefits that it produces based on several studies.  These benefits include decreasing body fat, increasing  in lean muscle mass and increasing in bone density.  There is improvement of memory, cognition.  There is improvement in exercise tolerance and endurance.  Testosterone therapy has also been shown to be protective against coronary artery disease, cerebrovascular disease, diabetes, hypertension and degenerative joint disease. I have discussed with the patient the FDA warnings regarding testosterone therapy, benefits and side effects and modes of administration as well as monitoring blood levels and side effects  on a regular basis The patient is agreeable that testosterone therapy should be an integral part of his/her wellness,quality of life and prevention of chronic disease.  Micronized  progesterone is being used in this patient for multiple benefits based on studies including protection against uterine cancer, breast cancer, osteoporosis and heart disease. The patient has been counseled regarding side effects, benefits and modes of administration. The patient is agreeable that this therapy is an integral part of her wellness, quality of life and prevention of chronic disease.  Estradiol is being used in this patient for multiple benefits based on several studies including protection against heart disease, cerebrovascular disease, osteoporosis, colon cancer, Alzheimer's disease, macular degeneration and cataracts. The patient has been counseled regarding benefits and side effects and modes of administration. The patient is agreeable that this therapy is an integral to part of her wellness, quality of life and prevention of chronic disease.  Objective:   Today's Vitals: BP 130/70   Pulse 72   Ht 5' 3.25" (1.607 m)   Wt 123 lb 3.2 oz (55.9 kg)   BMI 21.65 kg/m  Vitals with BMI 07/22/2019 11/28/2017 10/17/2016  Height 5' 3.25" 5\' 3"  -  Weight 123 lbs 3 oz 120 lbs 8 oz 125 lbs  BMI [id number] -  Systolic [id number]  Diastolic 70 70 80  Pulse 72 64 72     Physical Exam   She looks systemically well.  Her blood pressure actually is better than it was on the last visit in July when her diastolic blood pressure was 90.  Weight is stable.  No new physical findings.    Assessment   1. Post-menopause on HRT (hormone replacement therapy)   2. Vitamin D deficiency disease   3. Osteoporosis, unspecified osteoporosis type, unspecified pathological fracture presence   4. Mixed hyperlipidemia   5. Encounter for screening mammogram for malignant neoplasm of breast       Tests ordered Orders Placed This Encounter  Procedures  . MM 3D SCREEN BREAST BILATERAL  . Estradiol  . Progesterone     Plan: 1. She will continue with bioidentical hormone therapy as before and we  will check levels today. 2. She continues with weightbearing exercises for her osteoporosis together with estradiol and testosterone therapy. 3. We will schedule mammogram for her. 4. Tdap vaccination was given today. 5. She will follow-up with Judson Roch in February for annual Medicare wellness visit.   No orders of the defined types were placed in this encounter.   Doree Albee, MD

## 2019-07-23 LAB — PROGESTERONE: Progesterone: 12.2 ng/mL

## 2019-07-23 LAB — ESTRADIOL: Estradiol: 49 pg/mL

## 2019-08-22 ENCOUNTER — Telehealth (INDEPENDENT_AMBULATORY_CARE_PROVIDER_SITE_OTHER): Payer: Self-pay

## 2019-08-22 ENCOUNTER — Other Ambulatory Visit (INDEPENDENT_AMBULATORY_CARE_PROVIDER_SITE_OTHER): Payer: Self-pay | Admitting: Internal Medicine

## 2019-08-22 DIAGNOSIS — Z79890 Hormone replacement therapy (postmenopausal): Secondary | ICD-10-CM

## 2019-08-22 MED ORDER — PROGESTERONE MICRONIZED 200 MG PO CAPS: 200.0000 mg | ORAL_CAPSULE | Freq: Every day | ORAL | 0 refills | Status: DC

## 2019-08-26 MED ORDER — PROGESTERONE MICRONIZED 100 MG PO CAPS: 200.0000 mg | ORAL_CAPSULE | Freq: Every day | ORAL | Status: DC

## 2019-08-26 NOTE — Telephone Encounter (Signed)
Refill need to be sent to [facility].

## 2019-09-11 ENCOUNTER — Other Ambulatory Visit (INDEPENDENT_AMBULATORY_CARE_PROVIDER_SITE_OTHER): Payer: Self-pay

## 2019-09-11 MED ORDER — ESTRADIOL 2 MG PO TABS: 3.0000 mg | ORAL_TABLET | Freq: Every day | ORAL | 1 refills | Status: DC

## 2019-09-11 MED ORDER — PROGESTERONE MICRONIZED 200 MG PO CAPS: 200.0000 mg | ORAL_CAPSULE | Freq: Every day | ORAL | 0 refills | Status: DC

## 2019-09-16 ENCOUNTER — Other Ambulatory Visit (HOSPITAL_COMMUNITY): Payer: Self-pay | Admitting: Internal Medicine

## 2019-09-16 ENCOUNTER — Other Ambulatory Visit: Payer: Self-pay | Admitting: Internal Medicine

## 2019-10-02 DIAGNOSIS — B078 Other viral warts: Secondary | ICD-10-CM | POA: Diagnosis not present

## 2019-10-03 ENCOUNTER — Ambulatory Visit (HOSPITAL_COMMUNITY): Payer: Medicare Other

## 2019-10-11 ENCOUNTER — Encounter (INDEPENDENT_AMBULATORY_CARE_PROVIDER_SITE_OTHER): Payer: Self-pay | Admitting: Nurse Practitioner

## 2019-10-11 ENCOUNTER — Other Ambulatory Visit: Payer: Self-pay

## 2019-10-11 ENCOUNTER — Ambulatory Visit (INDEPENDENT_AMBULATORY_CARE_PROVIDER_SITE_OTHER): Payer: Medicare Other | Admitting: Nurse Practitioner

## 2019-10-11 DIAGNOSIS — Z0000 Encounter for general adult medical examination without abnormal findings: Secondary | ICD-10-CM

## 2019-10-11 NOTE — Progress Notes (Signed)
Due to national recommendations of social distancing related to the [place] pandemic, an audio/visual tele-health visit was felt to be the most appropriate encounter type for this patient today. I connected with  Morgan Fields on 10/11/19 utilizing audio-only technology and verified that I am speaking with the correct person using two identifiers. The patient was located at their home, and I was located at the office of [facility] during the encounter. I discussed the limitations of evaluation and management by telemedicine. The patient expressed understanding and agreed to proceed.  The patient did not have access to any equipment that had a video camera which would have allowed [place] to complete the visit via video.  Thus, audio only technology was used.     Subjective:   Morgan Fields is a 69 y.o. female who presents for Medicare Annual (Subsequent) preventive examination.   Cardiac Risk Factors include: advanced age (>89men, >64 women);dyslipidemia;smoking/ tobacco exposure     Objective:     Vitals: There were no vitals taken for this visit.  There is no height or weight on file to calculate BMI.  Patient did not have access to equipment to check her blood pressure at time of visit.  She tells me that she thinks that she can get her blood pressure checked in the near future and will send me a MyChart visit to report her blood pressure   Advanced Directives 10/11/2019 11/29/2018 10/05/2016 09/30/2016 08/10/2014 05/08/2013  Does Patient Have a Medical Advance Directive? Yes Yes Yes Yes No;Yes Patient does not have advance directive;Patient would not like information  Type of Advance Directive Out of facility DNR (pink MOST or yellow form) [place];Living will Living will;Healthcare Power of [place];Living will [place];Living will -  Does patient want to make changes to medical advance directive? No -  Patient declined No - Patient declined - - - -  Copy of [place] in Chart? - No - copy requested No - copy requested - No - copy requested -  Pre-existing out of facility DNR order (yellow form or pink MOST form) - - - - - No    Tobacco Social History   Tobacco Use  Smoking Status Current Every Day Smoker  . Packs/day: 0.50  . Years: 37.00  . Pack years: 18.50  . Types: Cigarettes  Smokeless Tobacco Never Used  Tobacco Comment   smokes 3-10 cig daily     Ready to quit: No Counseling given: No Comment: smokes 3-10 cig daily   Clinical Intake:  Pre-visit preparation completed: Yes  Pain : 0-10 Pain Score: 6  Pain Type: Acute pain Pain Location: Back Pain Orientation: Mid Pain Onset: In the past 7 days Pain Frequency: Occasional Pain Relieving Factors: iburpofen, tylenol  Pain Relieving Factors: iburpofen, tylenol  Nutritional Risks: None Diabetes: No  How often do you need to have someone help you when you read instructions, pamphlets, or other written materials from your doctor or pharmacy?: 1 - Never What is the last grade level you completed in school?: 1 year college  Interpreter Needed?: No  Information entered by :: Jeralyn Ruths, NP-C  Past Medical History:  Diagnosis Date  . Complication of anesthesia    takes a long time to wake up from anesthesia  . Depression   . HLD (hyperlipidemia) 07/22/2019  . Osteoporosis 07/22/2019  . PMB (postmenopausal bleeding) 03/11/2016  . Post-menopause on HRT (hormone replacement therapy) 03/11/2016  .  Skin cancer    Recent to anterior chest; cervical cancer  . Thickened endometrium 03/16/2016  . Vaginal Pap smear, abnormal   . Vitamin D deficiency disease 07/22/2019   Past Surgical History:  Procedure Laterality Date  . BREAST ENHANCEMENT SURGERY    . CERVICAL BIOPSY     precancerous years ago  . CHOLECYSTECTOMY  2011  . COLONOSCOPY N/A 05/08/2013   Procedure: COLONOSCOPY;  Surgeon: Rogene Houston, MD;  Location: [facility];  Service: Endoscopy;  Laterality: N/A;  200  . HYSTEROSCOPY WITH D & C N/A 10/05/2016   Procedure: HYSTEROSCOPY; UTERINE CURETTAGE;  Surgeon: Florian Buff, MD;  Location: [facility] ORS;  Service: Gynecology;  Laterality: N/A;  . POLYPECTOMY N/A 10/05/2016   Procedure: REMOVAL OF ENDOMETRIAL POLYP;  Surgeon: Florian Buff, MD;  Location: [facility] ORS;  Service: Gynecology;  Laterality: N/A;  . TUBAL LIGATION     Family History  Problem Relation Age of Onset  . Dementia Mother   . Hyperlipidemia Mother   . Thyroid disease Mother   . Hypertension Mother   . Alcohol abuse Father        Schizophrenia  . Mental illness Brother        Schizophrenia (2 brothers); schizophrenia and bipolar (1 brother)  . Heart attack Brother   . Heart attack Paternal Grandfather   . Heart disease Paternal Grandfather   . Diabetes Paternal Grandfather   . Polycystic ovary syndrome Daughter   . Other Daughter        knee replacement  . Thyroid disease Daughter        had radiation on thyroid  . Cancer Maternal Grandmother        colon  . Diabetes Maternal Grandmother   . Alzheimer's disease Maternal Grandfather   . Mental illness Paternal Grandmother   . Kidney failure Brother    Social History   Socioeconomic History  . Marital status: Married    Spouse name: Not on file  . Number of children: Not on file  . Years of education: Not on file  . Highest education level: Not on file  Occupational History  . Not on file  Tobacco Use  . Smoking status: Current Every Day Smoker    Packs/day: 0.50    Years: 37.00    Pack years: 18.50    Types: Cigarettes  . Smokeless tobacco: Never Used  . Tobacco comment: smokes 3-10 cig daily  Substance and Sexual Activity  . Alcohol use: Not Currently    Comment: Quit September 2019  . Drug use: No  . Sexual activity: Yes    Birth control/protection: Post-menopausal  Other Topics Concern  . Not on file  Social History Narrative    Married for 49 years.Retired,used to work at [employer].   Social Determinants of Health   Financial Resource Strain:   . Difficulty of Paying Living Expenses: Not on file  Food Insecurity:   . Worried About Charity fundraiser in the Last Year: Not on file  . Ran Out of Food in the Last Year: Not on file  Transportation Needs:   . Lack of Transportation (Medical): Not on file  . Lack of Transportation (Non-Medical): Not on file  Physical Activity:   . Days of Exercise per Week: Not on file  . Minutes of Exercise per Session: Not on file  Stress:   . Feeling of Stress : Not on file  Social Connections:   . Frequency of  Communication with Friends and Family: Not on file  . Frequency of Social Gatherings with Friends and Family: Not on file  . Attends Religious Services: Not on file  . Active Member of Clubs or Organizations: Not on file  . Attends Archivist Meetings: Not on file  . Marital Status: Not on file    Outpatient Encounter Medications as of 10/11/2019  Medication Sig  . Cholecalciferol (VITAMIN D-3) 125 MCG (5000 UT) TABS Take 1.5 tablets by mouth daily. Total of 8000IUs daily  . cyanocobalamin 100 MCG tablet Take 100 mcg by mouth daily.  Marland Kitchen estradiol (ESTRACE) 2 MG tablet Take 1.5 tablets (3 mg total) by mouth daily.  . NONFORMULARY OR COMPOUNDED ITEM Inject 5 mg into the muscle 2 (two) times a week. Testosterone cypionate in olive  oil (25 mg/mL).  Dispense 5 mL vial.  . progesterone (PROMETRIUM) 200 MG capsule Take 1 capsule (200 mg total) by mouth daily.  . [DISCONTINUED] progesterone (PROMETRIUM) capsule 200 mg    No facility-administered encounter medications on file as of 10/11/2019.    Activities of Daily Living In your present state of health, do you have any difficulty performing the following activities: 10/11/2019  Hearing? Y  Vision? Y  Difficulty concentrating or making decisions? Y  Walking or climbing stairs? N  Dressing or  bathing? N  Doing errands, shopping? N  Preparing Food and eating ? N  Using the Toilet? N  In the past six months, have you accidently leaked urine? N  Do you have problems with loss of bowel control? N  Managing your Medications? N  Managing your Finances? N  Housekeeping or managing your Housekeeping? N  Some recent data might be hidden    Patient Care Team: Doree Albee, MD as PCP - General (Internal Medicine)    Assessment:   This is a routine wellness examination for Morgan Fields.  Exercise Activities and Dietary recommendations Current Exercise Habits: Home exercise routine, Type of exercise: walking;Other - see comments(barre), Time (Minutes): 40, Frequency (Times/Week): 5, Weekly Exercise (Minutes/Week): 200, Intensity: Moderate, Exercise limited by: None identified  Goals   None     Fall Risk Fall Risk  10/11/2019  Falls in the past year? 0  Number falls in past yr: 0  Injury with Fall? 0  Follow up Education provided;Falls prevention discussed   Is the patient's home free of loose throw rugs in walkways, pet beds, electrical cords, etc?   yes      Grab bars in the bathroom? no      Handrails on the stairs?   yes      Adequate lighting?   yes  Timed Get Up and Go performed: Not performed today as office visit was conducted remotely  Depression Screen PHQ 2/9 Scores 10/11/2019 11/28/2017  PHQ - 2 Score 2 0  PHQ- 9 Score 8 -     Cognitive Function     6CIT Screen 10/11/2019  What Year? 0 points  What month? 0 points  What time? 0 points  Count back from 20 0 points  Months in reverse 0 points  Repeat phrase 0 points  Total Score 0    Immunization History  Administered Date(s) Administered  . Influenza-Unspecified 06/20/2019  . Pneumococcal Conjugate-13 07/31/2017  . Pneumococcal Polysaccharide-23 08/09/2018  . Tdap 07/22/2019  . Zoster 06/02/2017    Qualifies for Shingles Vaccine?  No  Screening Tests Health Maintenance  Topic Date Due  .  MAMMOGRAM  02/04/2019  .  COLONOSCOPY  05/09/2023  . TETANUS/TDAP  07/21/2029  . INFLUENZA VACCINE  Completed  . DEXA SCAN  Completed  . Hepatitis C Screening  Completed  . PNA vac Low Risk Adult  Completed    Cancer Screenings: Lung: Low Dose CT Chest recommended if Age 71-80 years, 30 pack-year currently smoking OR have quit w/in 15years. Patient does not qualify. Breast:  Up to date on Mammogram? No   Up to date of Bone Density/Dexa? Yes Colorectal: Up-to-date  Additional Screenings: Hepatitis C Screening: Already completed     Plan:   Patient is up-to-date with all immunizations except for the COVID-19 vaccine.  She is trying to get signed up with the health department.  I did provide her with the [facility] wait list sign up URL.  She is can consider signing up for this.  She will be due for colon cancer screening again in 2014.  She is not ready to quit smoking right yet, but is contemplating this currently.  She is overdue for breast cancer screening via mammography.  She tells me that she had a biopsy pleaded on the skin of her breast the day before her mammogram was scheduled and she canceled this appointment for that reason.  She tells me she will reschedule this appointment once the biopsy site is healed.  Depression screening was positive today.  She does have a history of depression.  She denies that she has suicidal ideation today.  She would prefer not to initiate any medication at this time.  Fall screening was low risk.  She has completed hepatitis screening in 2014 it was negative.  She would not qualify for lung cancer screening.  She would qualify for osteoporosis screening via bone density scan in 2021.  We did discuss her advanced care planning.  I did review her current MOST form with her and she wishes to not make any changes at this time.  I have personally reviewed and noted the following in the patient's chart:   . Medical and social history . Use of alcohol,  tobacco or illicit drugs  . Current medications and supplements . Functional ability and status . Nutritional status . Physical activity . Advanced directives . List of other physicians . Hospitalizations, surgeries, and ER visits in previous 12 months . Vitals . Screenings to include cognitive, depression, and falls . Referrals and appointments  In addition, I have reviewed and discussed with patient certain preventive protocols, quality metrics, and best practice recommendations. A written personalized care plan for preventive services as well as general preventive health recommendations were provided to patient.     Ailene Ards, NP  10/11/2019

## 2019-10-11 NOTE — Patient Instructions (Signed)
Thank you for choosing [facility] as your medical provider! If you have any questions or concerns regarding your health care, please do not hesitate to call our office.  Immunizations: 1. Flu: She will be due for flu shot again flu season. 2.  Tetanus: She will be due for tetanus shot again in 2030. 3.  Pneumonia: She has completed both pneumonia vaccines 4.  COVID-19: She is due for COVID-19 vaccine.  As stated in progress note she was given the wait list URL to sign up through [facility].  Signed up for this when you are able to. 5.  Shingles: You have been immunized and shingles  Health screenings: 1.  Colon cancer: Last colonoscopy was completed in 2014.  Return in 10 years which would be 2024. 2.  Breast cancer: Schedule your mammogram when you are able to. 3.  Depression: You will be due for depression screening again next year. 4.  Hepatitis C: You have completed the screening and do not need to repeat it. 5.  Osteoporosis: You will be due for bone density scan again anytime after June 2021. 6.  Lung cancer screening: You do not currently qualify for this at this time.  May consider this screening in the future if qualification changes.  Advance care planning: If you determine that you would like to make changes to your MOST form please let me know and we can schedule a visit for this.  Please follow-up as scheduled in 3 months. We look forward to seeing you again soon!  At [facility] we value your feedback. You may receive a survey about your visit today. Please share your experience as we strive to create trusting relationships with our patients to provide genuine, compassionate, quality care.  We appreciate your understanding and patience as we review any laboratory studies, imaging, and other diagnostic tests that are ordered as we care for you. We do our best to address any and all results in a timely manner. If you do not hear about test results within 1 week,  please do not hesitate to contact us. If we referred you to a specialist during your visit or ordered imaging testing, contact the office if you have not been contacted to be scheduled within 1 weeks.  We also encourage the use of MyChart, which contains your medical information for your review as well. If you are not enrolled in this feature, an access code is on this after visit summary for your convenience. Thank you for allowing [place] to be involved in your care.

## 2019-10-21 ENCOUNTER — Ambulatory Visit (INDEPENDENT_AMBULATORY_CARE_PROVIDER_SITE_OTHER): Payer: Medicare Other | Admitting: Nurse Practitioner

## 2019-10-31 ENCOUNTER — Ambulatory Visit: Payer: Self-pay | Attending: Internal Medicine

## 2019-10-31 ENCOUNTER — Other Ambulatory Visit (INDEPENDENT_AMBULATORY_CARE_PROVIDER_SITE_OTHER): Payer: Self-pay | Admitting: Internal Medicine

## 2019-10-31 ENCOUNTER — Ambulatory Visit (HOSPITAL_COMMUNITY)
Admission: RE | Admit: 2019-10-31 | Discharge: 2019-10-31 | Disposition: A | Discharge status: Home / Self Care | Payer: Medicare Other | Source: Ambulatory Visit | Attending: Internal Medicine | Admitting: Internal Medicine

## 2019-10-31 ENCOUNTER — Other Ambulatory Visit: Payer: Self-pay

## 2019-10-31 DIAGNOSIS — Z1231 Encounter for screening mammogram for malignant neoplasm of breast: Secondary | ICD-10-CM

## 2019-11-01 ENCOUNTER — Ambulatory Visit: Payer: Medicare Other | Attending: Internal Medicine

## 2019-11-01 DIAGNOSIS — Z23 Encounter for immunization: Secondary | ICD-10-CM | POA: Insufficient documentation

## 2019-11-01 NOTE — Progress Notes (Signed)
   Covid-19 Vaccination Clinic  Name:  Morgan Fields    MRN: [id number] DOB: 08/29/1951  11/01/2019  [name] was observed post Covid-19 immunization for 15 minutes without incidence. She was provided with Vaccine Information Sheet and instruction to access the V-Safe system.   [name] was instructed to call 911 with any severe reactions post vaccine: Marland Kitchen Difficulty breathing  . Swelling of your face and throat  . A fast heartbeat  . A bad rash all over your body  . Dizziness and weakness    Immunizations Administered    Name Date Dose VIS Date Route   Pfizer COVID-19 Vaccine 11/01/2019  4:11 PM 0.3 mL 08/30/2019 Intramuscular   Manufacturer: [place]   Lot: [id number]   [place]: [id number]

## 2019-11-24 ENCOUNTER — Ambulatory Visit: Payer: Medicare Other | Attending: Internal Medicine

## 2019-11-24 DIAGNOSIS — Z23 Encounter for immunization: Secondary | ICD-10-CM | POA: Insufficient documentation

## 2019-11-24 NOTE — Progress Notes (Signed)
   Covid-19 Vaccination Clinic  Name:  Morgan Fields    MRN: [id number] DOB: Sep 03, 1951  11/24/2019  [name] was observed post Covid-19 immunization for 15 minutes without incident. She was provided with Vaccine Information Sheet and instruction to access the V-Safe system.   [name] was instructed to call 911 with any severe reactions post vaccine: Marland Kitchen Difficulty breathing  . Swelling of face and throat  . A fast heartbeat  . A bad rash all over body  . Dizziness and weakness   Immunizations Administered    Name Date Dose VIS Date Route   Pfizer COVID-19 Vaccine 11/24/2019  9:57 AM 0.3 mL 08/30/2019 Intramuscular   Manufacturer: [place]   Lot: [id number]   [place]: [id number]

## 2019-11-27 ENCOUNTER — Encounter: Payer: Self-pay | Admitting: Adult Health

## 2019-11-27 ENCOUNTER — Other Ambulatory Visit: Payer: Self-pay

## 2019-11-27 ENCOUNTER — Ambulatory Visit (INDEPENDENT_AMBULATORY_CARE_PROVIDER_SITE_OTHER): Payer: Medicare Other | Admitting: Adult Health

## 2019-11-27 VITALS — BP 143/89 | HR 89 | Ht 63.0 in | Wt 125.0 lb

## 2019-11-27 DIAGNOSIS — L723 Sebaceous cyst: Secondary | ICD-10-CM

## 2019-11-27 NOTE — Progress Notes (Addendum)
  Subjective:     Patient ID: Morgan Fields, female   DOB: 1950/11/18, 69 y.o.   MRN: [id number]  HPI Morgan Fields is a 69 year old white female, married, PM on HRT, has ?wart felt something rough in vaginal area. PCP is Dr Anastasio Champion.   Review of Systems ?wart  Vaginal odor at times may every 2-3 weeks  She is sexually active, sometimes twice a day  Reviewed past medical,surgical, social and family history. Reviewed medications and allergies.     Objective:   Physical Exam BP (!) 143/89 (BP Location: Left Arm, Patient Position: Sitting, Cuff Size: Normal)   Pulse 89   Ht 5\' 3"  (1.6 m)   Wt 125 lb (56.7 kg)   BMI 22.14 kg/m  Fall risk is low PHQ 2 score is 0.  Skin warm and dry.Pelvic: external genitalia is normal in appearance,has 3 small sebaceous cysts right inner labial crease, vagina: scant white discharge without odor,urethra has no lesions or masses noted, cervix:smooth, uterus: normal size, shape and contour, non tender, no masses felt, adnexa: no masses or tenderness noted. Bladder is non tender and no masses felt.   Examination chaperoned by Rolena Infante LPN Assessment:     1. Sebaceous cyst   just leave alone, did show her pictures in Genital dermatology atlas      Plan:     Pap and physical 01/09/20

## 2020-01-06 ENCOUNTER — Telehealth (INDEPENDENT_AMBULATORY_CARE_PROVIDER_SITE_OTHER): Payer: Self-pay

## 2020-01-06 ENCOUNTER — Other Ambulatory Visit (INDEPENDENT_AMBULATORY_CARE_PROVIDER_SITE_OTHER): Payer: Self-pay | Admitting: Internal Medicine

## 2020-01-06 MED ORDER — PROGESTERONE 200 MG PO CAPS: 200.0000 mg | ORAL_CAPSULE | Freq: Every day | ORAL | 3 refills | Status: DC

## 2020-01-07 ENCOUNTER — Ambulatory Visit (INDEPENDENT_AMBULATORY_CARE_PROVIDER_SITE_OTHER): Payer: Medicare Other | Admitting: Internal Medicine

## 2020-01-07 ENCOUNTER — Encounter (INDEPENDENT_AMBULATORY_CARE_PROVIDER_SITE_OTHER): Payer: Self-pay | Admitting: Internal Medicine

## 2020-01-07 ENCOUNTER — Ambulatory Visit (HOSPITAL_COMMUNITY)
Admission: RE | Admit: 2020-01-07 | Discharge: 2020-01-07 | Disposition: A | Discharge status: Home / Self Care | Payer: Medicare Other | Source: Ambulatory Visit | Attending: Internal Medicine | Admitting: Internal Medicine

## 2020-01-07 ENCOUNTER — Other Ambulatory Visit: Payer: Self-pay

## 2020-01-07 VITALS — BP 120/80 | HR 79 | Temp 97.4°F | Ht 63.5 in | Wt 124.4 lb

## 2020-01-07 DIAGNOSIS — G8929 Other chronic pain: Secondary | ICD-10-CM

## 2020-01-07 DIAGNOSIS — E782 Mixed hyperlipidemia: Secondary | ICD-10-CM | POA: Diagnosis not present

## 2020-01-07 DIAGNOSIS — E2839 Other primary ovarian failure: Secondary | ICD-10-CM

## 2020-01-07 DIAGNOSIS — Z79890 Hormone replacement therapy (postmenopausal): Secondary | ICD-10-CM

## 2020-01-07 DIAGNOSIS — M47814 Spondylosis without myelopathy or radiculopathy, thoracic region: Secondary | ICD-10-CM | POA: Diagnosis not present

## 2020-01-07 DIAGNOSIS — E559 Vitamin D deficiency, unspecified: Secondary | ICD-10-CM

## 2020-01-07 DIAGNOSIS — M546 Pain in thoracic spine: Secondary | ICD-10-CM

## 2020-01-07 NOTE — Progress Notes (Signed)
Metrics: Intervention Frequency ACO  Documented Smoking Status Yearly  Screened one or more times in 24 months  Cessation Counseling or  Active cessation medication Past 24 months  Past 24 months   Guideline developer: UpToDate (See UpToDate for funding source) Date Released: 2014       Wellness Office Visit  Subjective:  Patient ID: Morgan Fields, female    DOB: Nov 09, 1950  Age: 69 y.o. MRN: [id number]  CC: This lady comes in for follow-up of hormone replacement therapy, hyperlipidemia, vitamin D deficiency.  She has a history of osteopenia. HPI  Today she is complaining of thoracic back pain in the midline area.  She has had this for several months but seems to be not improving.  She does have a history of lower back pain and an MRI was done in 2019 showing degenerative joint disease. The pain does not radiate anywhere else except stating that midline of the mid-lower thoracic spine area.  She does not appear to have any neurological symptoms regarding this. She continues on bioidentical hormone therapy as before and is tolerating these very well. Past Medical History:  Diagnosis Date  . Complication of anesthesia    takes a long time to wake up from anesthesia  . Depression   . HLD (hyperlipidemia) 07/22/2019  . Osteoporosis 07/22/2019  . PMB (postmenopausal bleeding) 03/11/2016  . Post-menopause on HRT (hormone replacement therapy) 03/11/2016  . Skin cancer    Recent to anterior chest; cervical cancer  . Thickened endometrium 03/16/2016  . Vaginal Pap smear, abnormal   . Vitamin D deficiency disease 07/22/2019      Family History  Problem Relation Age of Onset  . Dementia Mother   . Hyperlipidemia Mother   . Thyroid disease Mother   . Hypertension Mother   . Alcohol abuse Father        Schizophrenia  . Mental illness Brother        Schizophrenia (2 brothers); schizophrenia and bipolar (1 brother)  . Heart attack Brother   . Heart attack Paternal Grandfather   .  Heart disease Paternal Grandfather   . Diabetes Paternal Grandfather   . Polycystic ovary syndrome Daughter   . Other Daughter        knee replacement  . Thyroid disease Daughter        had radiation on thyroid  . Cancer Maternal Grandmother        colon  . Diabetes Maternal Grandmother   . Alzheimer's disease Maternal Grandfather   . Mental illness Paternal Grandmother   . Kidney failure Brother     Social History   Social History Narrative   Married for 49 years.Retired,used to work at [employer].   Social History   Tobacco Use  . Smoking status: Current Every Day Smoker    Packs/day: 0.50    Years: 37.00    Pack years: 18.50    Types: Cigarettes  . Smokeless tobacco: Never Used  . Tobacco comment: smokes 3-10 cig daily  Substance Use Topics  . Alcohol use: Not Currently    Comment: Quit September 2019    Current Meds  Medication Sig  . Cholecalciferol (VITAMIN D-3) 125 MCG (5000 UT) TABS Take 1.5 tablets by mouth daily. Total of 8000IUs daily  . diclofenac Sodium (VOLTAREN) 1 % GEL Apply 1 g topically 4 (four) times daily.  Marland Kitchen estradiol (ESTRACE) 2 MG tablet Take 1.5 tablets (3 mg total) by mouth daily.  . famotidine (PEPCID) 10 MG tablet Take  10 mg by mouth daily.  . NONFORMULARY OR COMPOUNDED ITEM Inject 5 mg into the muscle 2 (two) times a week. Testosterone cypionate in olive  oil (25 mg/mL).  Dispense 5 mL vial.  . progesterone (PROMETRIUM) 200 MG capsule Take 1 capsule (200 mg total) by mouth daily.  . progesterone (PROMETRIUM) 200 MG capsule Take 1 capsule (200 mg total) by mouth daily.  . vitamin B-12 (CYANOCOBALAMIN) 100 MCG tablet Take 100 mcg by mouth daily.      Objective:   Today's Vitals: BP 120/80 (BP Location: Right Arm, Patient Position: Sitting, Cuff Size: Normal)   Pulse 79   Temp (!) 97.4 F (36.3 C) (Temporal)   Ht 5' 3.5" (1.613 m)   Wt 124 lb 6.4 oz (56.4 kg)   SpO2 95%   BMI 21.69 kg/m  Vitals with BMI 01/07/2020  11/27/2019 07/22/2019  Height 5' 3.5" 5\' 3"  5' 3.25"  Weight 124 lbs 6 oz 125 lbs 123 lbs 3 oz  BMI 21.69 [id number]  Systolic 123456 [id number]  Diastolic 80 89 70  Pulse 79 89 72     Physical Exam    She looks systemically well.  Weight is stable.  Blood pressure is well controlled.  She does appear to have tenderness in the mid spine area, thoracic, around the region of T7-T9.  No focal neurological signs.   Assessment   1. Post-menopause on HRT (hormone replacement therapy)   2. Vitamin D deficiency disease   3. Mixed hyperlipidemia   4. Chronic midline thoracic back pain   5. Primary ovarian failure       Tests ordered Orders Placed This Encounter  Procedures  . DG Thoracic Spine 2 View  . COMPLETE METABOLIC PANEL WITH GFR  . Lipid panel  . Estradiol  . Progesterone  . Testos,Total,Free and SHBG (Female)  . VITAMIN D 25 Hydroxy (Vit-D Deficiency, Fractures)  . Ambulatory referral to Physical Therapy     Plan: 1. Blood work is ordered above. 2. I will send her for thoracic spine x-ray and also physical therapy.  We may need to get an MRI of the thoracic spine also depending on these results. 3. She will continue with bioidentical hormone therapy as before and we will check levels today. 4. We will check a lipid panel also as well as vitamin D levels. 5. Further recommendations will depend on all these results and I will see her in about 3 months time for follow-up or earlier should the need arise. 6. Today I spent 30 minutes with this patient addressing her symptoms above and chronic conditions above also.   No orders of the defined types were placed in this encounter.   Doree Albee, MD

## 2020-01-07 NOTE — Patient Instructions (Signed)
[facility] Dietary Recommendations for Weight Loss What to Avoid . Avoid added sugars o Often added sugar can be found in processed foods such as many condiments, dry cereals, cakes, cookies, chips, crisps, crackers, candies, sweetened drinks, etc.  o Read labels and AVOID/DECREASE use of foods with the following in their ingredient list: Sugar, fructose, high fructose corn syrup, sucrose, glucose, maltose, dextrose, molasses, cane sugar, brown sugar, any type of syrup, agave nectar, etc.   . Avoid snacking in between meals . Avoid foods made with flour o If you are going to eat food made with flour, choose those made with whole-grains; and, minimize your consumption as much as is tolerable . Avoid processed foods o These foods are generally stocked in the middle of the grocery store. Focus on shopping on the perimeter of the grocery.  . Avoid Meat  o We recommend following a plant-based diet at [name]. Thus, we recommend avoiding meat as a general rule. Consider eating beans, legumes, eggs, and/or dairy products for regular protein sources o If you plan on eating meat limit to 4 ounces of meat at a time and choose lean options such as Fish, chicken, turkey. Avoid red meat intake such as pork and/or steak What to Include . Vegetables o GREEN LEAFY VEGETABLES: Kale, spinach, mustard greens, collard greens, cabbage, broccoli, etc. o OTHER: Asparagus, cauliflower, eggplant, carrots, peas, Brussel sprouts, tomatoes, bell peppers, zucchini, beets, cucumbers, etc. . Grains, seeds, and legumes o Beans: kidney beans, black eyed peas, garbanzo beans, black beans, pinto beans, etc. o Whole, unrefined grains: brown rice, barley, bulgur, oatmeal, etc. . Healthy fats  o Avoid highly processed fats such as vegetable oil o Examples of healthy fats: avocado, olives, virgin olive oil, dark chocolate (?72% Cocoa), nuts (peanuts, almonds, walnuts, cashews, pecans, etc.) . None to Low  Intake of Animal Sources of Protein o Meat sources: chicken, turkey, salmon, tuna. Limit to 4 ounces of meat at one time. o Consider limiting dairy sources, but when choosing dairy focus on: PLAIN Greek yogurt, cottage cheese, high-protein milk . Fruit o Choose berries  When to Eat . Intermittent Fasting: o Choosing not to eat for a specific time period, but DO FOCUS ON HYDRATION when fasting o Multiple Techniques: - Time Restricted Eating: eat 3 meals in a day, each meal lasting no more than 60 minutes, no snacks between meals - 16-18 hour fast: fast for 16 to 18 hours up to 7 days a week. Often suggested to start with 2-3 nonconsecutive days per week.  . Remember the time you sleep is counted as fasting.  . Examples of eating schedule: Fast from 7:00pm-11:00am. Eat between 11:00am-7:00pm.  - 24-hour fast: fast for 24 hours up to every other day. Often suggested to start with 1 day per week . Remember the time you sleep is counted as fasting . Examples of eating schedule:  o Eating day: eat 2-3 meals on your eating day. If doing 2 meals, each meal should last no more than 90 minutes. If doing 3 meals, each meal should last no more than 60 minutes. Finish last meal by 7:00pm. o Fasting day: Fast until 7:00pm.  o IF YOU FEEL UNWELL FOR ANY REASON/IN ANY WAY WHEN FASTING, STOP FASTING BY EATING A NUTRITIOUS SNACK OR LIGHT MEAL o ALWAYS FOCUS ON HYDRATION DURING FASTS - Acceptable Hydration sources: water, broths, tea/coffee (black tea/coffee is best but using a small amount of whole-fat dairy products in coffee/tea is acceptable).  -   Poor Hydration Sources: anything with sugar or artificial sweeteners added to it  These recommendations have been developed for patients that are actively receiving medical care from either Dr. Emeli Goguen or Sarah Gray, DNP, NP-C at [name]. These recommendations are developed for patients with specific medical conditions and are not meant to be  distributed or used by others that are not actively receiving care from either provider listed above at [name]. It is not appropriate to participate in the above eating plans without proper medical supervision.   Reference: Fung, J. The obesity code. [place]: [place]; 2016.   

## 2020-01-08 NOTE — Telephone Encounter (Signed)
Done already

## 2020-01-09 ENCOUNTER — Ambulatory Visit (INDEPENDENT_AMBULATORY_CARE_PROVIDER_SITE_OTHER): Payer: Medicare Other | Admitting: Adult Health

## 2020-01-09 ENCOUNTER — Encounter: Payer: Self-pay | Admitting: Adult Health

## 2020-01-09 ENCOUNTER — Other Ambulatory Visit: Payer: Self-pay

## 2020-01-09 VITALS — BP 125/85 | HR 82 | Ht 62.25 in | Wt 124.0 lb

## 2020-01-09 DIAGNOSIS — Z01419 Encounter for gynecological examination (general) (routine) without abnormal findings: Secondary | ICD-10-CM | POA: Insufficient documentation

## 2020-01-09 DIAGNOSIS — Z1211 Encounter for screening for malignant neoplasm of colon: Secondary | ICD-10-CM | POA: Diagnosis not present

## 2020-01-09 DIAGNOSIS — Z1212 Encounter for screening for malignant neoplasm of rectum: Secondary | ICD-10-CM

## 2020-01-09 LAB — HEMOCCULT GUIAC POC 1CARD (OFFICE): Fecal Occult Blood, POC: NEGATIVE

## 2020-01-09 NOTE — Progress Notes (Signed)
Patient ID: [name], female   DOB: 1951-01-22, 69 y.o.   MRN: [id number] History of Present Illness:  [facility] is a 69 year old white female,married, PM in for a well woman gyn exam.She had a normal pap with negative HPV 11/28/17. She is on HRT from PCP and is going great and she looks great.  PCP is Dr [facility].   Current Medications, Allergies, Past Medical History, Past Surgical History, Family History and Social History were reviewed in [employer] record.     Review of Systems: Patient denies any headaches, hearing loss, fatigue, blurred vision, shortness of breath, chest pain, abdominal pain, problems with bowel movements, urination, or intercourse. No joint pain or mood swings.    Physical Exam:BP 125/85 (BP Location: Left Arm, Patient Position: Sitting, Cuff Size: Normal)   Pulse 82   Ht 5' 2.25" (1.581 m)   Wt 124 lb (56.2 kg)   BMI 22.50 kg/m  General:  Well developed, well nourished, no acute distress Skin:  Warm and dry Neck:  Midline trachea, normal thyroid, good ROM, no lymphadenopathy,no carotid bruits heard Lungs; Clear to auscultation bilaterally Breast:  No dominant palpable mass, retraction, or nipple discharge,has bilateral implants Cardiovascular: Regular rate and rhythm Abdomen:  Soft, non tender, no hepatosplenomegaly Pelvic:  External genitalia is normal in appearance, no lesions.  The vagina is pale but has good moisture, loss of rugae. Urethra has no lesions or masses. The cervix is smooth.  Uterus is felt to be normal size, shape, and contour.  No adnexal masses or tenderness noted.Bladder is non tender, no masses felt. Rectal: Good sphincter tone, no polyps, or hemorrhoids felt.  Hemoccult negative. Extremities/musculoskeletal:  No swelling or varicosities noted, no clubbing or cyanosis Psych:  No mood changes, alert and cooperative,seems happy AA is 0 Fall risk is low PHQ 9 score is 8, no SI, no meds she is doing good she  says  Examination chaperoned by Morgan Pupa LPN  Impression and Plan 1. Encounter for well woman exam with routine gynecological exam Pap and physical in 2 years Mammogram yearly Labs with PCP   2. Encounter for colorectal cancer screening Colonoscopy per GI

## 2020-01-10 LAB — COMPLETE METABOLIC PANEL WITH GFR
AG Ratio: 2 (calc) (ref 1.0–2.5)
ALT: 8 U/L (ref 6–29)
AST: 13 U/L (ref 10–35)
Albumin: 4.5 g/dL (ref 3.6–5.1)
Alkaline phosphatase (APISO): 43 U/L (ref 37–153)
BUN: 13 mg/dL (ref 7–25)
CO2: 27 mmol/L (ref 20–32)
Calcium: 9.5 mg/dL (ref 8.6–10.4)
Chloride: 108 mmol/L (ref 98–110)
Creat: 0.81 mg/dL (ref 0.50–0.99)
GFR, Est African American: 87 mL/min/{1.73_m2} (ref >=60)
GFR, Est Non African American: 75 mL/min/{1.73_m2} (ref >=60)
Globulin: 2.2 g/dL (calc) (ref 1.9–3.7)
Glucose, Bld: 85 mg/dL (ref 65–99)
Potassium: 4.3 mmol/L (ref 3.5–5.3)
Sodium: 142 mmol/L (ref 135–146)
Total Bilirubin: 0.4 mg/dL (ref 0.2–1.2)
Total Protein: 6.7 g/dL (ref 6.1–8.1)

## 2020-01-10 LAB — TESTOS,TOTAL,FREE AND SHBG (FEMALE)
Free Testosterone: 8.1 pg/mL — ABNORMAL HIGH (ref 0.1–6.4)
Sex Hormone Binding: 173 nmol/L — ABNORMAL HIGH (ref 14–73)
Testosterone, Total, LC-MS-MS: 145 ng/dL — ABNORMAL HIGH (ref 2–45)

## 2020-01-10 LAB — PROGESTERONE: Progesterone: <0.5 ng/mL

## 2020-01-10 LAB — LIPID PANEL
Cholesterol: 233 mg/dL — ABNORMAL HIGH (ref <200)
HDL: 58 mg/dL (ref >=50)
LDL Cholesterol (Calc): 152 mg/dL (calc) — ABNORMAL HIGH
Non-HDL Cholesterol (Calc): 175 mg/dL (calc) — ABNORMAL HIGH (ref <130)
Total CHOL/HDL Ratio: 4 (calc) (ref <5.0)
Triglycerides: 109 mg/dL (ref <150)

## 2020-01-10 LAB — VITAMIN D 25 HYDROXY (VIT D DEFICIENCY, FRACTURES): Vit D, 25-Hydroxy: 84 ng/mL (ref 30–100)

## 2020-01-10 LAB — ESTRADIOL: Estradiol: 55 pg/mL

## 2020-01-13 ENCOUNTER — Encounter (INDEPENDENT_AMBULATORY_CARE_PROVIDER_SITE_OTHER): Payer: Self-pay | Admitting: Internal Medicine

## 2020-01-16 ENCOUNTER — Ambulatory Visit (HOSPITAL_COMMUNITY): Payer: Medicare Other | Attending: Internal Medicine | Admitting: Physical Therapy

## 2020-01-16 ENCOUNTER — Other Ambulatory Visit: Payer: Self-pay

## 2020-01-16 ENCOUNTER — Encounter (HOSPITAL_COMMUNITY): Payer: Self-pay | Admitting: Physical Therapy

## 2020-01-16 DIAGNOSIS — M546 Pain in thoracic spine: Secondary | ICD-10-CM | POA: Insufficient documentation

## 2020-01-16 DIAGNOSIS — R29898 Other symptoms and signs involving the musculoskeletal system: Secondary | ICD-10-CM | POA: Diagnosis not present

## 2020-01-16 NOTE — Therapy (Signed)
[facility] [place], [place], [address] Phone: [phone]   Fax:  [phone]  Physical Therapy Evaluation  Patient Details  Name: [facility] MRN: [id number] Date of Birth: 1950/10/11 Referring Provider (PT): Doree Albee, MD   Encounter Date: 01/16/2020  PT End of Session - 01/16/20 1100    Visit Number  1    Number of Visits  12    Date for PT Re-Evaluation  02/27/20    Authorization Type  Primary: Medicare; Secondary: [employer] (No visit limit, no auth required)    Progress Note Due on Visit  10    PT Start Time  0945    PT Stop Time  1027    PT Time Calculation (min)  42 min    Activity Tolerance  Patient tolerated treatment well    Behavior During Therapy  [facility] for tasks assessed/performed       Past Medical History:  Diagnosis Date  . Complication of anesthesia    takes a long time to wake up from anesthesia  . Depression   . HLD (hyperlipidemia) 07/22/2019  . Osteoporosis 07/22/2019  . PMB (postmenopausal bleeding) 03/11/2016  . Post-menopause on HRT (hormone replacement therapy) 03/11/2016  . Skin cancer    Recent to anterior chest; cervical cancer  . Thickened endometrium 03/16/2016  . Vaginal Pap smear, abnormal   . Vitamin D deficiency disease 07/22/2019    Past Surgical History:  Procedure Laterality Date  . BREAST ENHANCEMENT SURGERY    . CERVICAL BIOPSY     precancerous years ago  . CHOLECYSTECTOMY  2011  . COLONOSCOPY N/A 05/08/2013   Procedure: COLONOSCOPY;  Surgeon: Rogene Houston, MD;  Location: [facility];  Service: Endoscopy;  Laterality: N/A;  200  . HYSTEROSCOPY WITH D & C N/A 10/05/2016   Procedure: HYSTEROSCOPY; UTERINE CURETTAGE;  Surgeon: Florian Buff, MD;  Location: [facility] OR[facility];  Service: Gynecology;  Laterality: N/A;  . POLYPECTOMY N/A 10/05/2016   Procedure: REMOVAL OF ENDOMETRIAL POLYP;  Surgeon: Florian Buff, MD;  Location: [facility];  Service: Gynecology;  Laterality: N/A;  .  TUBAL LIGATION      There were no vitals filed for this visit.   Subjective Assessment - 01/16/20 0950    Subjective  Patient reported that the low back pain that she was previously being seen for in physical therapy improved after she continued doing exercises at home for many months. She reported that she has pain in her mid back for the last several months now which has caused her to have to take breaks when doing home activities every 15 minutes. She reported that she has continued walking for exercise, but that she has had to decrease the pace of her walking due to the pain in her mid back. She reported she had an x-ray that shows arthritis and bone spurs in her back. She reported that she has started to do some deep breathing following a youtube video and that this has helped her back so far. Patient reported that at times her right shoulder blade bothers her.    Pertinent History  Mid-back pain which    Limitations  House hold activities    Diagnostic tests  x-ray: no acute abnormality    Patient Stated Goals  To decrease pain    Currently in Pain?  Yes    Pain Score  4     Pain Location  Back    Pain Orientation  Right    Pain Descriptors / Indicators  Sharp    Pain Type  Chronic pain    Pain Onset  More than a month ago    Pain Frequency  Intermittent    Aggravating Factors   Unknown, different movements    Pain Relieving Factors  Deep breathing         [facility] PT Assessment - 01/16/20 0001      Assessment   Medical Diagnosis  Chronic midline thoracic back pain    Referring Provider (PT)  Doree Albee, MD    Onset Date/Surgical Date  --   Many months   Next MD Visit  Unknown    Prior Therapy  Yes, for back      Precautions   Precautions  None      Restrictions   Weight Bearing Restrictions  No      Balance Screen   Has the patient fallen in the past 6 months  No    Has the patient had a decrease in activity level because of a fear of falling?   Yes    Is the  patient reluctant to leave their home because of a fear of falling?   No      Home Environment   Living Environment  Private residence    Living Arrangements  Spouse/significant other    Type of [place]  One level      Prior Function   Level of [place]  Retired      Associate Professor   Overall Cognitive Status  Within Functional Limits for tasks assessed      Observation/Other Assessments   Observations  Increased thoracic kyphosis    Focus on Therapeutic Outcomes (FOTO)   37%      Posture/Postural Control   Posture/Postural Control  Postural limitations    Postural Limitations  Decreased lumbar lordosis;Increased thoracic kyphosis;Posterior pelvic tilt;Rounded Shoulders;Forward head      ROM / Strength   AROM / PROM / Strength  AROM;Strength      AROM   Overall AROM Comments  Cervical AROM WFL; AROM recreated twinge of pain in right shoulder    AROM Assessment Site  Thoracic    Thoracic Flexion  WFL   Increased Kyphosis   Thoracic Extension  90% limited    Thoracic - Right Side Bend  [facility]    Thoracic - Left Side Bend  [facility]    Thoracic - Right Rotation  [facility]    Thoracic - Left Rotation  [facility]      Palpation   Spinal mobility  General hypomobility of thoracic spine    Palpation comment  Increased muscular restriction of periscapular and cervical paraspinals                Objective measurements completed on examination: See above findings.              PT Education - 01/16/20 1043    Education Details  Discussed examination findings and POC.    Person(s) Educated  Patient    Methods  Explanation    Comprehension  Verbalized understanding       PT Short Term Goals - 01/16/20 1045      PT SHORT TERM GOAL #1   Title  Patient will report understanding and regular compliance of HEP to decrease pain and improve overall functional mobility.    Time  3    Period  Weeks    Status  New    Target Date  02/06/20       PT SHORT TERM GOAL #2   Title  Patient will report improvement in her overall subjective complaint of at least 25% to improve QoL.    Time  3    Period  Weeks    Status  New    Target Date  02/06/20        PT Long Term Goals - 01/16/20 1045      PT LONG TERM GOAL #1   Title  Patient will report improvement in her overall subjective complaint of at least 50% to improve QoL.    Time  6    Period  Weeks    Status  New    Target Date  02/27/20      PT LONG TERM GOAL #2   Title  Patient will demonstrate improvement of at least 10% on FOTO indicating percieved functional mobility and improved activity tolerance.    Time  6    Period  Weeks    Status  New    Target Date  02/27/20      PT LONG TERM GOAL #3   Title  Patient will report ability to stand and perform household activities for at least 30 minutes for improved efficiency of completing chores at home.    Time  6    Period  Weeks    Status  New    Target Date  02/27/20             Plan - 01/16/20 1102    Clinical Impression Statement  Patient is a 69 year old female who presents to outpatient physical therapy with primary complaint of thoracic pain which has been ongoing for several months. Upon examination, patient demonstrates postural dysfunction including increased thoracic kyphosis, decreased lumbar lordosis and rounded shoulders and forward head. Patient's thoracic spine is limited in extension significantly with majority of movement occurring at the junction of the thoracic and lumbar spine. Patient demonstrates increased muscular restrictions through periscapular and cervical paraspinals as well. Patient would benefit from continued skilled physical therapy in order to address the abovementioned deficits and help patient return to her PLOF.    Personal Factors and Comorbidities  Age;Comorbidity 3+    Comorbidities  Osteoporosis; osteoarthritis; depression    Examination-Activity Limitations   Lift;Stand;Locomotion Level;Bend;Sit    Examination-Participation Restrictions  Meal Prep;Yard Work;Community Activity;Laundry;Shop    Stability/Clinical Decision Making  Evolving/Moderate complexity    Clinical Decision Making  Moderate    Rehab Potential  Good    PT Frequency  2x / week    PT Duration  6 weeks    PT Treatment/Interventions  ADLs/Self Care Home Management;Aquatic Therapy;Cryotherapy;Electrical Stimulation;Moist Heat;Traction;DME Instruction;Gait training;Stair training;Functional mobility training;Therapeutic activities;Therapeutic exercise;Balance training;Neuromuscular re-education;Patient/family education;Manual techniques;Passive range of motion;Dry needling;Taping    PT Next Visit Plan  Review Evaluation/goals. Thoracic spine: book openers, laying over foam roller, cervical extension stretch. STM. Gentle breathing with gentle rib mobilizations    PT Home Exercise Plan  Initiate at next session    Consulted and Agree with Plan of Care  Patient       Patient will benefit from skilled therapeutic intervention in order to improve the following deficits and impairments:  Increased fascial restricitons, Improper body mechanics, Pain, Decreased mobility, Postural dysfunction, Decreased activity tolerance, Decreased range of motion, Hypomobility  Visit Diagnosis: Pain in thoracic spine  Other symptoms and signs involving the musculoskeletal system  Problem List Patient Active Problem List   Diagnosis Date Noted  . Encounter for colorectal cancer screening 01/09/2020  . Encounter for well woman exam with routine gynecological exam 01/09/2020  . Sebaceous cyst 11/27/2019  . Vitamin D deficiency disease 07/22/2019  . Osteoporosis 07/22/2019  . HLD (hyperlipidemia) 07/22/2019  . Encounter for gynecological examination with Papanicolaou smear of cervix 11/28/2017  . Thickened endometrium 03/16/2016  . PMB (postmenopausal bleeding) 03/11/2016  . Post-menopause on HRT  (hormone replacement therapy) 03/11/2016  . Loss of weight 04/15/2013  . Diarrhea 04/15/2013  . Skin cancer 04/15/2013   [facility] PT, DPT 11:07 AM, 01/16/20 [place], [place], [address] Phone: [phone]   Fax:  [phone]  Name: [facility] MRN: [id number] Date of Birth: 02/06/1951

## 2020-01-20 ENCOUNTER — Other Ambulatory Visit (INDEPENDENT_AMBULATORY_CARE_PROVIDER_SITE_OTHER): Payer: Self-pay

## 2020-01-20 MED ORDER — PROGESTERONE 200 MG PO CAPS: 200.0000 mg | ORAL_CAPSULE | Freq: Every day | ORAL | 3 refills | Status: DC

## 2020-01-21 ENCOUNTER — Ambulatory Visit (HOSPITAL_COMMUNITY): Payer: Medicare Other | Admitting: Physical Therapy

## 2020-01-21 ENCOUNTER — Telehealth (HOSPITAL_COMMUNITY): Payer: Self-pay | Admitting: Physical Therapy

## 2020-01-21 NOTE — Telephone Encounter (Signed)
pt cancelled appt today because she is not feeling well with a bad sinus infection

## 2020-01-29 ENCOUNTER — Ambulatory Visit (HOSPITAL_COMMUNITY): Payer: Medicare Other | Attending: Internal Medicine | Admitting: Physical Therapy

## 2020-01-29 ENCOUNTER — Encounter (HOSPITAL_COMMUNITY): Payer: Self-pay | Admitting: Physical Therapy

## 2020-01-29 ENCOUNTER — Other Ambulatory Visit: Payer: Self-pay

## 2020-01-29 DIAGNOSIS — R29898 Other symptoms and signs involving the musculoskeletal system: Secondary | ICD-10-CM

## 2020-01-29 DIAGNOSIS — M546 Pain in thoracic spine: Secondary | ICD-10-CM | POA: Diagnosis not present

## 2020-01-29 NOTE — Patient Instructions (Signed)
[place] access code: [email or web address]

## 2020-01-29 NOTE — Therapy (Signed)
[place], [place], [address] Phone: [phone]   Fax:  [phone]  Physical Therapy Treatment  Patient Details  Name: Morgan Fields MRN: [id number] Date of Birth: February 25, 1951 Referring Provider (PT): Doree Albee, MD   Encounter Date: 01/29/2020  PT End of Session - 01/29/20 0821    Visit Number  2    Number of Visits  12    Date for PT Re-Evaluation  02/27/20    Authorization Type  Primary: Medicare; Secondary: T[employer] (No visit limit, no auth required)    Progress Note Due on Visit  10    PT Start Time  0815    PT Stop Time  0855    PT Time Calculation (min)  40 min    Activity Tolerance  Patient tolerated treatment well    Behavior During Therapy  [facility] for tasks assessed/performed       Past Medical History:  Diagnosis Date  . Complication of anesthesia    takes a long time to wake up from anesthesia  . Depression   . HLD (hyperlipidemia) 07/22/2019  . Osteoporosis 07/22/2019  . PMB (postmenopausal bleeding) 03/11/2016  . Post-menopause on HRT (hormone replacement therapy) 03/11/2016  . Skin cancer    Recent to anterior chest; cervical cancer  . Thickened endometrium 03/16/2016  . Vaginal Pap smear, abnormal   . Vitamin D deficiency disease 07/22/2019    Past Surgical History:  Procedure Laterality Date  . BREAST ENHANCEMENT SURGERY    . CERVICAL BIOPSY     precancerous years ago  . CHOLECYSTECTOMY  2011  . COLONOSCOPY N/A 05/08/2013   Procedure: COLONOSCOPY;  Surgeon: Rogene Houston, MD;  [facility];  Service: Endoscopy;  Laterality: N/A;  200  . HYSTEROSCOPY WITH D & C N/A 10/05/2016   Procedure: HYSTEROSCOPY; UTERINE CURETTAGE;  Surgeon: Florian Buff, MD;  Location: [facility];  Service: Gynecology;  Laterality: N/A;  . POLYPECTOMY N/A 10/05/2016   Procedure: REMOVAL OF ENDOMETRIAL POLYP;  Surgeon: Florian Buff, MD;  Location: [facility];  Service: Gynecology;  Laterality: N/A;  .  TUBAL LIGATION      There were no vitals filed for this visit.  Subjective Assessment - 01/29/20 0819    Subjective  Patient reported that her pain gets worse as the day goes on. She reported that currently the pain is a 2/10.    Pertinent History  Mid-back pain which    Limitations  House hold activities    Diagnostic tests  x-ray: no acute abnormality    Patient Stated Goals  To decrease pain    Currently in Pain?  Yes    Pain Score  2     Pain Location  Back    Pain Orientation  Right    Pain Descriptors / Indicators  Sharp    Pain Type  Chronic pain    Pain Onset  More than a month ago                       [facility] Adult PT Treatment/Exercise - 01/29/20 0001      Exercises   Exercises  Lumbar      Lumbar Exercises: Stretches   Other Lumbar Stretch Exercise  Thoracic bookopeners x10 5-10 second holds      Lumbar Exercises: Seated   Other Seated Lumbar Exercises  On small physioball 10 deep diaphragmatic breaths      Manual Therapy  Manual Therapy  Joint mobilization;Soft tissue mobilization    Manual therapy comments  All manual completed separately from other skilled interventions    Joint Mobilization  Coordinated with breathing, rib mobilizations oscillations grade III at end of exhale x10. Thoracic spine PA joint mobs grade II to decrease pain and improve mobility as well as rotational joint mobiliations.    Soft tissue mobilization  To thoracic paraspinals and right periscapular muscle for pain relief and to promote relaxation.              PT Education - 01/29/20 [id number]    Education Details  Discussed goals and initial HEP.    Person(s) Educated  Patient    Methods  Explanation;Handout    Comprehension  Verbalized understanding       PT Short Term Goals - 01/29/20 [id number]      PT SHORT TERM GOAL #1   Title  Patient will report understanding and regular compliance of HEP to decrease pain and improve overall functional mobility.    Time  3     Period  Weeks    Status  On-going    Target Date  02/06/20      PT SHORT TERM GOAL #2   Title  Patient will report improvement in her overall subjective complaint of at least 25% to improve QoL.    Time  3    Period  Weeks    Status  On-going    Target Date  02/06/20        PT Long Term Goals - 01/29/20 [id number]      PT LONG TERM GOAL #1   Title  Patient will report improvement in her overall subjective complaint of at least 50% to improve QoL.    Time  6    Period  Weeks    Status  On-going      PT LONG TERM GOAL #2   Title  Patient will demonstrate improvement of at least 10% on FOTO indicating percieved functional mobility and improved activity tolerance.    Time  6    Period  Weeks    Status  On-going      PT LONG TERM GOAL #3   Title  Patient will report ability to stand and perform household activities for at least 30 minutes for improved efficiency of completing chores at home.    Time  6    Period  Weeks    Status  On-going            Plan - 01/29/20 0933    Clinical Impression Statement  Began by reviewing patient's evaluation and goals. Initiated HEP of thoracic mobility exercise. Worked on integrating deep breathing for exercise as well as in conjunction with rib mobilizations. Ended session with soft tissue mobilization to promote relaxation and to decrease pain. Patient would benefit from continued skilled physical therapy to continue progressing towards functional goals.    Personal Factors and Comorbidities  Age;Comorbidity 3+    Comorbidities  Osteoporosis; osteoarthritis; depression    Examination-Activity Limitations  Lift;Stand;Locomotion Level;Bend;Sit    Examination-Participation Restrictions  Meal Prep;Yard Work;Community Activity;Laundry;Shop    Stability/Clinical Decision Making  Evolving/Moderate complexity    Rehab Potential  Good    PT Frequency  2x / week    PT Duration  6 weeks    PT Treatment/Interventions  ADLs/Self Care Home  Management;Aquatic Therapy;Cryotherapy;Electrical Stimulation;Moist Heat;Traction;DME Instruction;Gait training;Stair training;Functional mobility training;Therapeutic activities;Therapeutic exercise;Balance training;Neuromuscular re-education;Patient/family education;Manual techniques;Passive range of motion;Dry needling;Taping  PT Next Visit Plan  F/u on HEP. Trial laying over foam roller, cervical extension stretch. F/U on effectiveness of manual and perform if positive result.    PT Home Exercise Plan  01/29/20: Thoracic book openers    Consulted and Agree with Plan of Care  Patient       Patient will benefit from skilled therapeutic intervention in order to improve the following deficits and impairments:  Increased fascial restricitons, Improper body mechanics, Pain, Decreased mobility, Postural dysfunction, Decreased activity tolerance, Decreased range of motion, Hypomobility  Visit Diagnosis: Pain in thoracic spine  Other symptoms and signs involving the musculoskeletal system     Problem List Patient Active Problem List   Diagnosis Date Noted  . Encounter for colorectal cancer screening 01/09/2020  . Encounter for well woman exam with routine gynecological exam 01/09/2020  . Sebaceous cyst 11/27/2019  . Vitamin D deficiency disease 07/22/2019  . Osteoporosis 07/22/2019  . HLD (hyperlipidemia) 07/22/2019  . Encounter for gynecological examination with Papanicolaou smear of cervix 11/28/2017  . Thickened endometrium 03/16/2016  . PMB (postmenopausal bleeding) 03/11/2016  . Post-menopause on HRT (hormone replacement therapy) 03/11/2016  . Loss of weight 04/15/2013  . Diarrhea 04/15/2013  . Skin cancer 04/15/2013   [name] PT, DPT 9:37 AM, 01/29/20 [place], [place], [address] Phone: [phone]   Fax:  [phone]  Name: Morgan Fields MRN: [id number] Date of Birth:  [birth year]

## 2020-01-31 ENCOUNTER — Other Ambulatory Visit: Payer: Self-pay

## 2020-01-31 ENCOUNTER — Ambulatory Visit (HOSPITAL_COMMUNITY): Payer: Medicare Other | Admitting: Physical Therapy

## 2020-01-31 ENCOUNTER — Encounter (HOSPITAL_COMMUNITY): Payer: Self-pay | Admitting: Physical Therapy

## 2020-01-31 DIAGNOSIS — R29898 Other symptoms and signs involving the musculoskeletal system: Secondary | ICD-10-CM

## 2020-01-31 DIAGNOSIS — M546 Pain in thoracic spine: Secondary | ICD-10-CM

## 2020-01-31 NOTE — Therapy (Signed)
[place], [place], [address] Phone: [phone]   Fax:  [phone]  Physical Therapy Treatment  Patient Details  Name: Morgan Fields MRN: [id number] Date of Birth: 1950-12-14 Referring Provider (PT): Doree Albee, MD   Encounter Date: 01/31/2020  PT End of Session - 01/31/20 1712    Visit Number  3    Number of Visits  12    Date for PT Re-Evaluation  02/27/20    Authorization Type  Primary: Medicare; Secondary: T[employer]are (No visit limit, no auth required)    Progress Note Due on Visit  10    PT Start Time  1515    PT Stop Time  1600    PT Time Calculation (min)  45 min    Activity Tolerance  Patient tolerated treatment well    Behavior During Therapy  [facility] for tasks assessed/performed       Past Medical History:  Diagnosis Date  . Complication of anesthesia    takes a long time to wake up from anesthesia  . Depression   . HLD (hyperlipidemia) 07/22/2019  . Osteoporosis 07/22/2019  . PMB (postmenopausal bleeding) 03/11/2016  . Post-menopause on HRT (hormone replacement therapy) 03/11/2016  . Skin cancer    Recent to anterior chest; cervical cancer  . Thickened endometrium 03/16/2016  . Vaginal Pap smear, abnormal   . Vitamin D deficiency disease 07/22/2019    Past Surgical History:  Procedure Laterality Date  . BREAST ENHANCEMENT SURGERY    . CERVICAL BIOPSY     precancerous years ago  . CHOLECYSTECTOMY  2011  . COLONOSCOPY N/A 05/08/2013   Procedure: COLONOSCOPY;  Surgeon: Rogene Houston, MD;  [facility];  Service: Endoscopy;  Laterality: N/A;  200  . HYSTEROSCOPY WITH D & C N/A 10/05/2016   Procedure: HYSTEROSCOPY; UTERINE CURETTAGE;  Surgeon: Florian Buff, MD;  Location: [facility];  Service: Gynecology;  Laterality: N/A;  . POLYPECTOMY N/A 10/05/2016   Procedure: REMOVAL OF ENDOMETRIAL POLYP;  Surgeon: Florian Buff, MD;  Location: [facility];  Service: Gynecology;  Laterality: N/A;  .  TUBAL LIGATION      There were no vitals filed for this visit.  Subjective Assessment - 01/31/20 1519    Subjective  Patient reports a 6/10 pain currently. She reported that earlier in the day she had a higher pain level of a 9/10.    Pertinent History  Mid-back pain which    Limitations  House hold activities    Diagnostic tests  x-ray: no acute abnormality    Patient Stated Goals  To decrease pain    Currently in Pain?  Yes    Pain Score  6     Pain Location  Back    Pain Orientation  Right    Pain Descriptors / Indicators  Sharp    Pain Type  Chronic pain    Pain Onset  More than a month ago                        [facility] Adult PT Treatment/Exercise - 01/31/20 0001      Lumbar Exercises: Stretches   Other Lumbar Stretch Exercise  Thoracic bookopeners x10 5-10 second holds    Other Lumbar Stretch Exercise  Supine on foam roller for pecs and lats x 1 minutes. Supine over large physioball 2x1 minute cervical extension stretch. Segemental joint self mobiilzation thoracic spine on foam roller 3x5''  each.       Manual Therapy   Manual Therapy  Joint mobilization;Soft tissue mobilization    Manual therapy comments  All manual completed separately from other skilled interventions    Joint Mobilization  Coordinated with breathing, rib mobilizations oscillations grade III at end of exhale x10. Thoracic spine PA joint mobs grade II to decrease pain and improve mobility as well as rotational joint mobiliations.    Soft tissue mobilization  To thoracic paraspinals and right periscapular muscle for pain relief and to promote relaxation.                PT Short Term Goals - 01/29/20 [id number]      PT SHORT TERM GOAL #1   Title  Patient will report understanding and regular compliance of HEP to decrease pain and improve overall functional mobility.    Time  3    Period  Weeks    Status  On-going    Target Date  02/06/20      PT SHORT TERM GOAL #2   Title  Patient will  report improvement in her overall subjective complaint of at least 25% to improve QoL.    Time  3    Period  Weeks    Status  On-going    Target Date  02/06/20        PT Long Term Goals - 01/29/20 [id number]      PT LONG TERM GOAL #1   Title  Patient will report improvement in her overall subjective complaint of at least 50% to improve QoL.    Time  6    Period  Weeks    Status  On-going      PT LONG TERM GOAL #2   Title  Patient will demonstrate improvement of at least 10% on FOTO indicating percieved functional mobility and improved activity tolerance.    Time  6    Period  Weeks    Status  On-going      PT LONG TERM GOAL #3   Title  Patient will report ability to stand and perform household activities for at least 30 minutes for improved efficiency of completing chores at home.    Time  6    Period  Weeks    Status  On-going            Plan - 01/31/20 1713    Clinical Impression Statement  Focused on thoracic mobility exercises this session. Educated patient on physioball stretching laying supine to stretch anterior musculature, and encourage thoracic extension. Educated on foam roller stretch for pectoral muscles and anterior musculature and also educated self-mobilization techniques utilizing the foam roller. Ended session with manual therapy with patient reporting a decrease in pain at the end of the session.    Personal Factors and Comorbidities  Age;Comorbidity 3+    Comorbidities  Osteoporosis; osteoarthritis; depression    Examination-Activity Limitations  Lift;Stand;Locomotion Level;Bend;Sit    Examination-Participation Restrictions  Meal Prep;Yard Work;Community Activity;Laundry;Shop    Stability/Clinical Decision Making  Evolving/Moderate complexity    Rehab Potential  Good    PT Frequency  2x / week    PT Duration  6 weeks    PT Treatment/Interventions  ADLs/Self Care Home Management;Aquatic Therapy;Cryotherapy;Electrical Stimulation;Moist Heat;Traction;DME  Instruction;Gait training;Stair training;Functional mobility training;Therapeutic activities;Therapeutic exercise;Balance training;Neuromuscular re-education;Patient/family education;Manual techniques;Passive range of motion;Dry needling;Taping    PT Next Visit Plan  F/U on if able to obtain physioball or foam roller. Trial child's pose variations for lat stretching.  PT Home Exercise Plan  01/29/20: Thoracic book openers    Consulted and Agree with Plan of Care  Patient       Patient will benefit from skilled therapeutic intervention in order to improve the following deficits and impairments:  Increased fascial restricitons, Improper body mechanics, Pain, Decreased mobility, Postural dysfunction, Decreased activity tolerance, Decreased range of motion, Hypomobility  Visit Diagnosis: Pain in thoracic spine  Other symptoms and signs involving the musculoskeletal system     Problem List Patient Active Problem List   Diagnosis Date Noted  . Encounter for colorectal cancer screening 01/09/2020  . Encounter for well woman exam with routine gynecological exam 01/09/2020  . Sebaceous cyst 11/27/2019  . Vitamin D deficiency disease 07/22/2019  . Osteoporosis 07/22/2019  . HLD (hyperlipidemia) 07/22/2019  . Encounter for gynecological examination with Papanicolaou smear of cervix 11/28/2017  . Thickened endometrium 03/16/2016  . PMB (postmenopausal bleeding) 03/11/2016  . Post-menopause on HRT (hormone replacement therapy) 03/11/2016  . Loss of weight 04/15/2013  . Diarrhea 04/15/2013  . Skin cancer 04/15/2013   [name] PT, DPT 5:17 PM, 01/31/20 [place], [place], [address] Phone: [phone]   Fax:  [phone]  Name: WILHELMINIA LOE MRN: [id number] Date of Birth: 1951/06/20

## 2020-02-04 ENCOUNTER — Ambulatory Visit (HOSPITAL_COMMUNITY): Payer: Medicare Other | Admitting: Physical Therapy

## 2020-02-04 ENCOUNTER — Other Ambulatory Visit: Payer: Self-pay

## 2020-02-04 ENCOUNTER — Encounter (HOSPITAL_COMMUNITY): Payer: Self-pay | Admitting: Physical Therapy

## 2020-02-04 DIAGNOSIS — R29898 Other symptoms and signs involving the musculoskeletal system: Secondary | ICD-10-CM | POA: Diagnosis not present

## 2020-02-04 DIAGNOSIS — M546 Pain in thoracic spine: Secondary | ICD-10-CM | POA: Diagnosis not present

## 2020-02-04 NOTE — Therapy (Signed)
[place], [place], [address] Phone: [phone]   Fax:  [phone]  Physical Therapy Treatment  Patient Details  Name: Morgan Fields MRN: [id number] Date of Birth: [birth year] Referring Provider (PT): Doree Albee, MD   Encounter Date: 02/04/2020  PT End of Session - 02/04/20 1527    Visit Number  4    Number of Visits  12    Date for PT Re-Evaluation  02/27/20    Authorization Type  Primary: Medicare; Secondary: T[employer]are (No visit limit, no auth required)    Progress Note Due on Visit  10    PT Start Time  1350    PT Stop Time  1430    PT Time Calculation (min)  40 min    Activity Tolerance  Patient tolerated treatment well    Behavior During Therapy  [facility] for tasks assessed/performed       Past Medical History:  Diagnosis Date  . Complication of anesthesia    takes a long time to wake up from anesthesia  . Depression   . HLD (hyperlipidemia) 07/22/2019  . Osteoporosis 07/22/2019  . PMB (postmenopausal bleeding) 03/11/2016  . Post-menopause on HRT (hormone replacement therapy) 03/11/2016  . Skin cancer    Recent to anterior chest; cervical cancer  . Thickened endometrium 03/16/2016  . Vaginal Pap smear, abnormal   . Vitamin D deficiency disease 07/22/2019    Past Surgical History:  Procedure Laterality Date  . BREAST ENHANCEMENT SURGERY    . CERVICAL BIOPSY     precancerous years ago  . CHOLECYSTECTOMY  2011  . COLONOSCOPY N/A 05/08/2013   Procedure: COLONOSCOPY;  Surgeon: Rogene Houston, MD;  [facility];  Service: Endoscopy;  Laterality: N/A;  200  . HYSTEROSCOPY WITH D & C N/A 10/05/2016   Procedure: HYSTEROSCOPY; UTERINE CURETTAGE;  Surgeon: Florian Buff, MD;  Location: [facility];  Service: Gynecology;  Laterality: N/A;  . POLYPECTOMY N/A 10/05/2016   Procedure: REMOVAL OF ENDOMETRIAL POLYP;  Surgeon: Florian Buff, MD;  Location: [facility];  Service: Gynecology;  Laterality: N/A;  .  TUBAL LIGATION      There were no vitals filed for this visit.  Subjective Assessment - 02/04/20 1356    Subjective  Patient reported she has been feeling good. She reports a 0/10 pain currently along the midline of her back.    Pertinent History  Mid-back pain which    Limitations  House hold activities    Diagnostic tests  x-ray: no acute abnormality    Patient Stated Goals  To decrease pain    Currently in Pain?  No/denies                        [facility] Adult PT Treatment/Exercise - 02/04/20 0001      Lumbar Exercises: Stretches   Other Lumbar Stretch Exercise  Thoracic bookopeners x10 5-10 second holds    Other Lumbar Stretch Exercise  Child's pose forward, LT, and RT. Supine on foam roller for pecs and lats x 1 minutes. Supine over large physioball 2x1 minute cervical extension stretch. Segemental joint self mobiilzation thoracic spine on foam roller 3x5'' each.       Lumbar Exercises: Standing   Other Standing Lumbar Exercises  Serratus anterior punches/activation x10 cues to scoop from shoulder blade, not wrist      Manual Therapy   Manual Therapy  Joint mobilization;Soft tissue mobilization  Manual therapy comments  All manual completed separately from other skilled interventions    Joint Mobilization  Thoracic spine CPA joint mobs grade II to decrease pain and improve mobility as well as rotational joint mobiliations.    Soft tissue mobilization  To thoracic paraspinals and right periscapular muscle for pain relief and to promote relaxation.                PT Short Term Goals - 01/29/20 [id number]      PT SHORT TERM GOAL #1   Title  Patient will report understanding and regular compliance of HEP to decrease pain and improve overall functional mobility.    Time  3    Period  Weeks    Status  On-going    Target Date  02/06/20      PT SHORT TERM GOAL #2   Title  Patient will report improvement in her overall subjective complaint of at least 25% to  improve QoL.    Time  3    Period  Weeks    Status  On-going    Target Date  02/06/20        PT Long Term Goals - 01/29/20 [id number]      PT LONG TERM GOAL #1   Title  Patient will report improvement in her overall subjective complaint of at least 50% to improve QoL.    Time  6    Period  Weeks    Status  On-going      PT LONG TERM GOAL #2   Title  Patient will demonstrate improvement of at least 10% on FOTO indicating percieved functional mobility and improved activity tolerance.    Time  6    Period  Weeks    Status  On-going      PT LONG TERM GOAL #3   Title  Patient will report ability to stand and perform household activities for at least 30 minutes for improved efficiency of completing chores at home.    Time  6    Period  Weeks    Status  On-going            Plan - 02/04/20 1527    Clinical Impression Statement  Patient reports an improvement in her pain this session. Stated that currently she has 0/10 pain. This session added child's pose to work on latissimus dorsi stretching and promoting relaxation. This session added serratus anterior activation and strengthening exercise to improve coordination of scapulothoracic rhythm. Plan to progress shoulder stabilization exercises as able.    Personal Factors and Comorbidities  Age;Comorbidity 3+    Comorbidities  Osteoporosis; osteoarthritis; depression    Examination-Activity Limitations  Lift;Stand;Locomotion Level;Bend;Sit    Examination-Participation Restrictions  Meal Prep;Yard Work;Community Activity;Laundry;Shop    Stability/Clinical Decision Making  Evolving/Moderate complexity    Rehab Potential  Good    PT Frequency  2x / week    PT Duration  6 weeks    PT Treatment/Interventions  ADLs/Self Care Home Management;Aquatic Therapy;Cryotherapy;Electrical Stimulation;Moist Heat;Traction;DME Instruction;Gait training;Stair training;Functional mobility training;Therapeutic activities;Therapeutic exercise;Balance  training;Neuromuscular re-education;Patient/family education;Manual techniques;Passive range of motion;Dry needling;Taping    PT Next Visit Plan  F/U on serratus anterior strengthening. Add shoulder stabilization exercises: consider walking with weight, plank on elbows on modified plantigrade surface. Ball on wall rotation. Update HEP PRN    PT Home Exercise Plan  01/29/20: Thoracic book openers    Consulted and Agree with Plan of Care  Patient       Patient will benefit  from skilled therapeutic intervention in order to improve the following deficits and impairments:  Increased fascial restricitons, Improper body mechanics, Pain, Decreased mobility, Postural dysfunction, Decreased activity tolerance, Decreased range of motion, Hypomobility  Visit Diagnosis: Pain in thoracic spine  Other symptoms and signs involving the musculoskeletal system     Problem List Patient Active Problem List   Diagnosis Date Noted  . Encounter for colorectal cancer screening 01/09/2020  . Encounter for well woman exam with routine gynecological exam 01/09/2020  . Sebaceous cyst 11/27/2019  . Vitamin D deficiency disease 07/22/2019  . Osteoporosis 07/22/2019  . HLD (hyperlipidemia) 07/22/2019  . Encounter for gynecological examination with Papanicolaou smear of cervix 11/28/2017  . Thickened endometrium 03/16/2016  . PMB (postmenopausal bleeding) 03/11/2016  . Post-menopause on HRT (hormone replacement therapy) 03/11/2016  . Loss of weight 04/15/2013  . Diarrhea 04/15/2013  . Skin cancer 04/15/2013   [name] PT, DPT 3:31 PM, 02/04/20 [place], [place], [address] Phone: [phone]   Fax:  [phone]  Name: Morgan Fields MRN: [id number] Date of Birth: April 06, 1951

## 2020-02-06 ENCOUNTER — Ambulatory Visit (HOSPITAL_COMMUNITY): Payer: Medicare Other | Admitting: Physical Therapy

## 2020-02-06 ENCOUNTER — Encounter (HOSPITAL_COMMUNITY): Payer: Self-pay | Admitting: Physical Therapy

## 2020-02-06 ENCOUNTER — Other Ambulatory Visit: Payer: Self-pay

## 2020-02-06 DIAGNOSIS — M546 Pain in thoracic spine: Secondary | ICD-10-CM | POA: Diagnosis not present

## 2020-02-06 DIAGNOSIS — R29898 Other symptoms and signs involving the musculoskeletal system: Secondary | ICD-10-CM | POA: Diagnosis not present

## 2020-02-06 NOTE — Therapy (Signed)
[place], [place], [address] Phone: [phone]   Fax:  [phone]  Physical Therapy Treatment  Patient Details  Name: Morgan Fields MRN: [id number] Date of Birth: Oct 16, 1950 Referring Provider (PT): Doree Albee, MD   Encounter Date: 02/06/2020  PT End of Session - 02/06/20 0904    Visit Number  5    Number of Visits  12    Date for PT Re-Evaluation  02/27/20    Authorization Type  Primary: Medicare; Secondary: Tricare (No visit limit, no auth required)    Progress Note Due on Visit  10    PT Start Time  0858    PT Stop Time  0940    PT Time Calculation (min)  42 min    Activity Tolerance  Patient tolerated treatment well    Behavior During Therapy  [facility] for tasks assessed/performed       Past Medical History:  Diagnosis Date  . Complication of anesthesia    takes a long time to wake up from anesthesia  . Depression   . HLD (hyperlipidemia) 07/22/2019  . Osteoporosis 07/22/2019  . PMB (postmenopausal bleeding) 03/11/2016  . Post-menopause on HRT (hormone replacement therapy) 03/11/2016  . Skin cancer    Recent to anterior chest; cervical cancer  . Thickened endometrium 03/16/2016  . Vaginal Pap smear, abnormal   . Vitamin D deficiency disease 07/22/2019    Past Surgical History:  Procedure Laterality Date  . BREAST ENHANCEMENT SURGERY    . CERVICAL BIOPSY     precancerous years ago  . CHOLECYSTECTOMY  2011  . COLONOSCOPY N/A 05/08/2013   Procedure: COLONOSCOPY;  Surgeon: Rogene Houston, MD;  [facility];  Service: Endoscopy;  Laterality: N/A;  200  . HYSTEROSCOPY WITH D & C N/A 10/05/2016   Procedure: HYSTEROSCOPY; UTERINE CURETTAGE;  Surgeon: Florian Buff, MD;  Location: [facility];  Service: Gynecology;  Laterality: N/A;  . POLYPECTOMY N/A 10/05/2016   Procedure: REMOVAL OF ENDOMETRIAL POLYP;  Surgeon: Florian Buff, MD;  Location: [facility] OR[facility];  Service: Gynecology;  Laterality: N/A;  .  TUBAL LIGATION      There were no vitals filed for this visit.  Subjective Assessment - 02/06/20 0903    Subjective  Patient denied any pain, but reported some tingling and numbness down her right arm.    Pertinent History  Mid-back pain which    Limitations  House hold activities    Diagnostic tests  x-ray: no acute abnormality    Patient Stated Goals  To decrease pain    Currently in Pain?  No/denies                        [facility] Adult PT Treatment/Exercise - 02/06/20 0001      Lumbar Exercises: Stretches   Other Lumbar Stretch Exercise  Thoracic bookopeners x10 5-10 second holds    Other Lumbar Stretch Exercise  Child's pose forward, LT, and RT. Supine on foam roller for pecs and lats x 1 minutes. Segemental joint self mobiilzation thoracic spine on foam roller 3x5'' each.       Manual Therapy   Manual Therapy  Joint mobilization;Soft tissue mobilization;Passive ROM    Manual therapy comments  All manual completed separately from other skilled interventions    Joint Mobilization  Thoracic spine CPA joint mobs grade II to decrease pain and improve mobility as well as rotational joint mobiliations.  Soft tissue mobilization  To thoracic paraspinals and right periscapular muscle for pain relief and to promote relaxation and upper trap.     Passive ROM  Passive stretch to SCM and upper trap                PT Short Term Goals - 01/29/20 [id number]      PT SHORT TERM GOAL #1   Title  Patient will report understanding and regular compliance of HEP to decrease pain and improve overall functional mobility.    Time  3    Period  Weeks    Status  On-going    Target Date  02/06/20      PT SHORT TERM GOAL #2   Title  Patient will report improvement in her overall subjective complaint of at least 25% to improve QoL.    Time  3    Period  Weeks    Status  On-going    Target Date  02/06/20        PT Long Term Goals - 01/29/20 [id number]      PT LONG TERM GOAL #1    Title  Patient will report improvement in her overall subjective complaint of at least 50% to improve QoL.    Time  6    Period  Weeks    Status  On-going      PT LONG TERM GOAL #2   Title  Patient will demonstrate improvement of at least 10% on FOTO indicating percieved functional mobility and improved activity tolerance.    Time  6    Period  Weeks    Status  On-going      PT LONG TERM GOAL #3   Title  Patient will report ability to stand and perform household activities for at least 30 minutes for improved efficiency of completing chores at home.    Time  6    Period  Weeks    Status  On-going            Plan - 02/06/20 1102    Clinical Impression Statement  Patient continues to report a decrease in back pain, but does report some increase in tingling down right arm. Continued to focus on mobility exercises for thoracic spine. Added passive stretching to SCM and upper trap to decrease tightness in this area, in order to change mechanics of movement in the right scapular area. Continued with joint mobilizations for thoracic spine to continue improving mobility in this area. Patient reports no increase in pain at end of session.    Personal Factors and Comorbidities  Age;Comorbidity 3+    Comorbidities  Osteoporosis; osteoarthritis; depression    Examination-Activity Limitations  Lift;Stand;Locomotion Level;Bend;Sit    Examination-Participation Restrictions  Meal Prep;Yard Work;Community Activity;Laundry;Shop    Stability/Clinical Decision Making  Evolving/Moderate complexity    Rehab Potential  Good    PT Frequency  2x / week    PT Duration  6 weeks    PT Treatment/Interventions  ADLs/Self Care Home Management;Aquatic Therapy;Cryotherapy;Electrical Stimulation;Moist Heat;Traction;DME Instruction;Gait training;Stair training;Functional mobility training;Therapeutic activities;Therapeutic exercise;Balance training;Neuromuscular re-education;Patient/family education;Manual  techniques;Passive range of motion;Dry needling;Taping    PT Next Visit Plan  Follow-up on tingling/numbness in RT arm and consider adding shoulder stabilization exercises: consider walking with weight, plank on elbows on modified plantigrade surface. Ball on wall rotation. Update HEP PRN    PT Home Exercise Plan  01/29/20: Thoracic book openers    Consulted and Agree with Plan of Care  Patient  Patient will benefit from skilled therapeutic intervention in order to improve the following deficits and impairments:  Increased fascial restricitons, Improper body mechanics, Pain, Decreased mobility, Postural dysfunction, Decreased activity tolerance, Decreased range of motion, Hypomobility  Visit Diagnosis: Pain in thoracic spine  Other symptoms and signs involving the musculoskeletal system     Problem List Patient Active Problem List   Diagnosis Date Noted  . Encounter for colorectal cancer screening 01/09/2020  . Encounter for well woman exam with routine gynecological exam 01/09/2020  . Sebaceous cyst 11/27/2019  . Vitamin D deficiency disease 07/22/2019  . Osteoporosis 07/22/2019  . HLD (hyperlipidemia) 07/22/2019  . Encounter for gynecological examination with Papanicolaou smear of cervix 11/28/2017  . Thickened endometrium 03/16/2016  . PMB (postmenopausal bleeding) 03/11/2016  . Post-menopause on HRT (hormone replacement therapy) 03/11/2016  . Loss of weight 04/15/2013  . Diarrhea 04/15/2013  . Skin cancer 04/15/2013   [name] PT, DPT 11:04 AM, 02/06/20 [place], [place], [address] Phone: [phone]   Fax:  [phone]  Name: AABHA KLOSKY MRN: [id number] Date of Birth: 01/05/1951

## 2020-02-11 ENCOUNTER — Encounter (HOSPITAL_COMMUNITY): Payer: Self-pay | Admitting: Physical Therapy

## 2020-02-11 ENCOUNTER — Ambulatory Visit (HOSPITAL_COMMUNITY): Payer: Medicare Other | Admitting: Physical Therapy

## 2020-02-11 ENCOUNTER — Other Ambulatory Visit: Payer: Self-pay

## 2020-02-11 DIAGNOSIS — M546 Pain in thoracic spine: Secondary | ICD-10-CM

## 2020-02-11 DIAGNOSIS — R29898 Other symptoms and signs involving the musculoskeletal system: Secondary | ICD-10-CM | POA: Diagnosis not present

## 2020-02-11 NOTE — Therapy (Signed)
[place], [place], [address] Phone: [phone]   Fax:  [phone]  Physical Therapy Treatment  Patient Details  Name: Morgan Fields MRN: [id number] Date of Birth: [birth year] Referring Provider (PT): Doree Albee, MD   Encounter Date: 02/11/2020  PT End of Session - 02/11/20 0959    Visit Number  6    Number of Visits  12    Date for PT Re-Evaluation  02/27/20    Authorization Type  Primary: Medicare; Secondary: T[employer]are (No visit limit, no auth required)    Progress Note Due on Visit  10    PT Start Time  [id number]    PT Stop Time  1024    PT Time Calculation (min)  38 min    Activity Tolerance  Patient tolerated treatment well    Behavior During Therapy  [facility] for tasks assessed/performed       Past Medical History:  Diagnosis Date  . Complication of anesthesia    takes a long time to wake up from anesthesia  . Depression   . HLD (hyperlipidemia) 07/22/2019  . Osteoporosis 07/22/2019  . PMB (postmenopausal bleeding) 03/11/2016  . Post-menopause on HRT (hormone replacement therapy) 03/11/2016  . Skin cancer    Recent to anterior chest; cervical cancer  . Thickened endometrium 03/16/2016  . Vaginal Pap smear, abnormal   . Vitamin D deficiency disease 07/22/2019    Past Surgical History:  Procedure Laterality Date  . BREAST ENHANCEMENT SURGERY    . CERVICAL BIOPSY     precancerous years ago  . CHOLECYSTECTOMY  2011  . COLONOSCOPY N/A 05/08/2013   Procedure: COLONOSCOPY;  Surgeon: Rogene Houston, MD;  [facility];  Service: Endoscopy;  Laterality: N/A;  200  . HYSTEROSCOPY WITH D & C N/A 10/05/2016   Procedure: HYSTEROSCOPY; UTERINE CURETTAGE;  Surgeon: Florian Buff, MD;  Location: [facility];  Service: Gynecology;  Laterality: N/A;  . POLYPECTOMY N/A 10/05/2016   Procedure: REMOVAL OF ENDOMETRIAL POLYP;  Surgeon: Florian Buff, MD;  Location: [facility];  Service: Gynecology;  Laterality: N/A;  .  TUBAL LIGATION      There were no vitals filed for this visit.  Subjective Assessment - 02/11/20 0951    Subjective  Patient reports pain under her shoulder blade which she rates as a 5/10.    Pertinent History  Mid-back pain which    Limitations  House hold activities    Diagnostic tests  x-ray: no acute abnormality    Patient Stated Goals  To decrease pain    Currently in Pain?  Yes    Pain Score  5     Pain Location  Thoracic   Under shoulder blade   Pain Orientation  Right    Pain Descriptors / Indicators  [facility] Adult PT Treatment/Exercise - 02/11/20 0001      Lumbar Exercises: Stretches   Other Lumbar Stretch Exercise  Thoracic bookopeners x10 5-10 second holds    Other Lumbar Stretch Exercise  Child's pose forward, LT, and RT. Supine on foam roller for pecs and lats x 1 minutes. Segemental joint self mobiilzation thoracic spine on foam roller 3x5'' each.       Manual Therapy   Manual Therapy  Joint mobilization;Soft tissue mobilization    Manual therapy comments  All manual  completed separately from other skilled interventions    Joint Mobilization  Thoracic T1-T10 spine CPA joint mobs grade II to decrease pain and improve mobility as well as rotational joint mobiliations.    Soft tissue mobilization  To thoracic paraspinals and right periscapular muscle for pain relief and to promote relaxation and upper trap.                PT Short Term Goals - 01/29/20 [id number]      PT SHORT TERM GOAL #1   Title  Patient will report understanding and regular compliance of HEP to decrease pain and improve overall functional mobility.    Time  3    Period  Weeks    Status  On-going    Target Date  02/06/20      PT SHORT TERM GOAL #2   Title  Patient will report improvement in her overall subjective complaint of at least 25% to improve QoL.    Time  3    Period  Weeks    Status  On-going    Target Date  02/06/20        PT  Long Term Goals - 01/29/20 [id number]      PT LONG TERM GOAL #1   Title  Patient will report improvement in her overall subjective complaint of at least 50% to improve QoL.    Time  6    Period  Weeks    Status  On-going      PT LONG TERM GOAL #2   Title  Patient will demonstrate improvement of at least 10% on FOTO indicating percieved functional mobility and improved activity tolerance.    Time  6    Period  Weeks    Status  On-going      PT LONG TERM GOAL #3   Title  Patient will report ability to stand and perform household activities for at least 30 minutes for improved efficiency of completing chores at home.    Time  6    Period  Weeks    Status  On-going            Plan - 02/11/20 1043    Clinical Impression Statement  Patient with some stiffness reported as well as pain around right shoulder blade. Continued to focus on thoracic mobility exercises this session. Ended with manual therapy with joint mobilizations to thoracic spine and soft tissue mobilization. Patient reported an improvement in symptoms at end of session. Discussed with patient's benefits of aquatic therapy and plan to integrate aquatic therapy into treatment plan in order to strengthen patient with less force through the joints.    Personal Factors and Comorbidities  Age;Comorbidity 3+    Comorbidities  Osteoporosis; osteoarthritis; depression    Examination-Activity Limitations  Lift;Stand;Locomotion Level;Bend;Sit    Examination-Participation Restrictions  Meal Prep;Yard Work;Community Activity;Laundry;Shop    Stability/Clinical Decision Making  Evolving/Moderate complexity    Rehab Potential  Good    PT Frequency  2x / week    PT Duration  6 weeks    PT Treatment/Interventions  ADLs/Self Care Home Management;Aquatic Therapy;Cryotherapy;Electrical Stimulation;Moist Heat;Traction;DME Instruction;Gait training;Stair training;Functional mobility training;Therapeutic activities;Therapeutic exercise;Balance  training;Neuromuscular re-education;Patient/family education;Manual techniques;Passive range of motion;Dry needling;Taping    PT Next Visit Plan  Provide paperwork regarding aquatic therapy. Follow-up on tingling/numbness in RT arm and consider adding shoulder stabilization exercises: consider walking with weight, plank on elbows on modified plantigrade surface. Ball on wall rotation. Update HEP PRN    PT Home Exercise Plan  01/29/20: Thoracic book openers; 02/11/20: Foam roller lat stretch and self-mobilization    Consulted and Agree with Plan of Care  Patient       Patient will benefit from skilled therapeutic intervention in order to improve the following deficits and impairments:  Increased fascial restricitons, Improper body mechanics, Pain, Decreased mobility, Postural dysfunction, Decreased activity tolerance, Decreased range of motion, Hypomobility  Visit Diagnosis: Pain in thoracic spine  Other symptoms and signs involving the musculoskeletal system     Problem List Patient Active Problem List   Diagnosis Date Noted  . Encounter for colorectal cancer screening 01/09/2020  . Encounter for well woman exam with routine gynecological exam 01/09/2020  . Sebaceous cyst 11/27/2019  . Vitamin D deficiency disease 07/22/2019  . Osteoporosis 07/22/2019  . HLD (hyperlipidemia) 07/22/2019  . Encounter for gynecological examination with Papanicolaou smear of cervix 11/28/2017  . Thickened endometrium 03/16/2016  . PMB (postmenopausal bleeding) 03/11/2016  . Post-menopause on HRT (hormone replacement therapy) 03/11/2016  . Loss of weight 04/15/2013  . Diarrhea 04/15/2013  . Skin cancer 04/15/2013   [name] PT, DPT 10:46 AM, 02/11/20 [place], [place], [address] Phone: [phone]   Fax:  [phone]  Name: JISELLE MATZNER MRN: [id number] Date of Birth: 1951/06/22

## 2020-02-13 ENCOUNTER — Encounter (HOSPITAL_COMMUNITY): Payer: Self-pay | Admitting: Physical Therapy

## 2020-02-13 ENCOUNTER — Other Ambulatory Visit: Payer: Self-pay

## 2020-02-13 ENCOUNTER — Ambulatory Visit (HOSPITAL_COMMUNITY): Payer: Medicare Other | Admitting: Physical Therapy

## 2020-02-13 DIAGNOSIS — R29898 Other symptoms and signs involving the musculoskeletal system: Secondary | ICD-10-CM

## 2020-02-13 DIAGNOSIS — M546 Pain in thoracic spine: Secondary | ICD-10-CM | POA: Diagnosis not present

## 2020-02-13 NOTE — Therapy (Signed)
[place], [place], [address] Phone: [phone]   Fax:  [phone]  Physical Therapy Treatment  Patient Details  Name: Morgan Fields MRN: [id number] Date of Birth: [birth year] Referring Provider (PT): Doree Albee, MD   Encounter Date: 02/13/2020  PT End of Session - 02/13/20 2112    Visit Number  7    Number of Visits  12    Date for PT Re-Evaluation  02/27/20    Authorization Type  Primary: Medicare; Secondary: Tricare (No visit limit, no auth required)    Progress Note Due on Visit  10    PT Start Time  0952    PT Stop Time  1030    PT Time Calculation (min)  38 min    Activity Tolerance  Patient tolerated treatment well    Behavior During Therapy  [facility] for tasks assessed/performed       Past Medical History:  Diagnosis Date  . Complication of anesthesia    takes a long time to wake up from anesthesia  . Depression   . HLD (hyperlipidemia) 07/22/2019  . Osteoporosis 07/22/2019  . PMB (postmenopausal bleeding) 03/11/2016  . Post-menopause on HRT (hormone replacement therapy) 03/11/2016  . Skin cancer    Recent to anterior chest; cervical cancer  . Thickened endometrium 03/16/2016  . Vaginal Pap smear, abnormal   . Vitamin D deficiency disease 07/22/2019    Past Surgical History:  Procedure Laterality Date  . BREAST ENHANCEMENT SURGERY    . CERVICAL BIOPSY     precancerous years ago  . CHOLECYSTECTOMY  2011  . COLONOSCOPY N/A 05/08/2013   Procedure: COLONOSCOPY;  Surgeon: Rogene Houston, MD;  [facility];  Service: Endoscopy;  Laterality: N/A;  200  . HYSTEROSCOPY WITH D & C N/A 10/05/2016   Procedure: HYSTEROSCOPY; UTERINE CURETTAGE;  Surgeon: Florian Buff, MD;  Location: [facility];  Service: Gynecology;  Laterality: N/A;  . POLYPECTOMY N/A 10/05/2016   Procedure: REMOVAL OF ENDOMETRIAL POLYP;  Surgeon: Florian Buff, MD;  Location: [facility];  Service: Gynecology;  Laterality: N/A;  .  TUBAL LIGATION      There were no vitals filed for this visit.  Subjective Assessment - 02/13/20 0959    Subjective  Patient reports a 1/10 pain currently. She stated yesterday she was doing yardwork and that she was having pain, but she was able to take a break and get some rest and her pain started to feel better.    Pertinent History  Mid-back pain which    Limitations  House hold activities    Diagnostic tests  x-ray: no acute abnormality    Patient Stated Goals  To decrease pain    Currently in Pain?  Yes    Pain Score  1     Pain Location  Thoracic    Pain Orientation  Right    Pain Descriptors / Indicators  [facility] Adult PT Treatment/Exercise - 02/13/20 0001      Lumbar Exercises: Stretches   Other Lumbar Stretch Exercise  Child's pose forward, LT, and RT. Supine on foam roller for pecs and lats x 1 minutes. Segemental joint self mobiilzation thoracic spine on foam roller 3x5'' each.       Lumbar Exercises: Supine   Ab Set  10 reps;5 seconds  AB Set Limitations  Supine bil LEs on small green ball legs in 90/90 degree angle       Manual Therapy   Manual Therapy  Joint mobilization;Soft tissue mobilization    Manual therapy comments  All manual completed separately from other skilled interventions    Joint Mobilization  Thoracic T1-T10 spine CPA joint mobs grade II to decrease pain and improve mobility as well as rotational joint mobiliations.               PT Short Term Goals - 01/29/20 [id number]      PT SHORT TERM GOAL #1   Title  Patient will report understanding and regular compliance of HEP to decrease pain and improve overall functional mobility.    Time  3    Period  Weeks    Status  On-going    Target Date  02/06/20      PT SHORT TERM GOAL #2   Title  Patient will report improvement in her overall subjective complaint of at least 25% to improve QoL.    Time  3    Period  Weeks    Status  On-going    Target  Date  02/06/20        PT Long Term Goals - 01/29/20 [id number]      PT LONG TERM GOAL #1   Title  Patient will report improvement in her overall subjective complaint of at least 50% to improve QoL.    Time  6    Period  Weeks    Status  On-going      PT LONG TERM GOAL #2   Title  Patient will demonstrate improvement of at least 10% on FOTO indicating percieved functional mobility and improved activity tolerance.    Time  6    Period  Weeks    Status  On-going      PT LONG TERM GOAL #3   Title  Patient will report ability to stand and perform household activities for at least 30 minutes for improved efficiency of completing chores at home.    Time  6    Period  Weeks    Status  On-going            Plan - 02/13/20 2105    Clinical Impression Statement  Patient reporting improvements since beginning therapy in reducing her pain and learning how to take breaks to manage pain. This session added core stabilization exercise and focused on mobility exercises. Provided patient with aquatic therapy information for upcoming visit. Ended session with joint mobilizations for improved mobility and pain modulation. Patient reported an improvement in symptoms at the end of session.    Personal Factors and Comorbidities  Age;Comorbidity 3+    Comorbidities  Osteoporosis; osteoarthritis; depression    Examination-Activity Limitations  Lift;Stand;Locomotion Level;Bend;Sit    Examination-Participation Restrictions  Meal Prep;Yard Work;Community Activity;Laundry;Shop    Stability/Clinical Decision Making  Evolving/Moderate complexity    Rehab Potential  Good    PT Frequency  2x / week    PT Duration  6 weeks    PT Treatment/Interventions  ADLs/Self Care Home Management;Aquatic Therapy;Cryotherapy;Electrical Stimulation;Moist Heat;Traction;DME Instruction;Gait training;Stair training;Functional mobility training;Therapeutic activities;Therapeutic exercise;Balance training;Neuromuscular  re-education;Patient/family education;Manual techniques;Passive range of motion;Dry needling;Taping    PT Next Visit Plan  COntinue thoracic mobility exercises, progress core stabilization add seated on physioball marching with ab set.    PT Home Exercise Plan  01/29/20: Thoracic book openers; 02/11/20: Foam roller lat stretch and self-mobilization  Consulted and Agree with Plan of Care  Patient       Patient will benefit from skilled therapeutic intervention in order to improve the following deficits and impairments:  Increased fascial restricitons, Improper body mechanics, Pain, Decreased mobility, Postural dysfunction, Decreased activity tolerance, Decreased range of motion, Hypomobility  Visit Diagnosis: Pain in thoracic spine  Other symptoms and signs involving the musculoskeletal system     Problem List Patient Active Problem List   Diagnosis Date Noted  . Encounter for colorectal cancer screening 01/09/2020  . Encounter for well woman exam with routine gynecological exam 01/09/2020  . Sebaceous cyst 11/27/2019  . Vitamin D deficiency disease 07/22/2019  . Osteoporosis 07/22/2019  . HLD (hyperlipidemia) 07/22/2019  . Encounter for gynecological examination with Papanicolaou smear of cervix 11/28/2017  . Thickened endometrium 03/16/2016  . PMB (postmenopausal bleeding) 03/11/2016  . Post-menopause on HRT (hormone replacement therapy) 03/11/2016  . Loss of weight 04/15/2013  . Diarrhea 04/15/2013  . Skin cancer 04/15/2013   [name] PT, DPT 9:15 PM, 02/13/20 [place], [place], [address] Phone: [phone]   Fax:  [phone]  Name: JAMESA MCAFEE MRN: [id number] Date of Birth: 07-27-1951

## 2020-02-18 ENCOUNTER — Ambulatory Visit (HOSPITAL_COMMUNITY): Payer: Medicare Other | Attending: Internal Medicine

## 2020-02-18 ENCOUNTER — Other Ambulatory Visit: Payer: Self-pay

## 2020-02-18 ENCOUNTER — Encounter (HOSPITAL_COMMUNITY): Payer: Self-pay

## 2020-02-18 DIAGNOSIS — R29898 Other symptoms and signs involving the musculoskeletal system: Secondary | ICD-10-CM

## 2020-02-18 DIAGNOSIS — M6281 Muscle weakness (generalized): Secondary | ICD-10-CM | POA: Diagnosis present

## 2020-02-18 DIAGNOSIS — M546 Pain in thoracic spine: Secondary | ICD-10-CM | POA: Diagnosis not present

## 2020-02-18 DIAGNOSIS — M545 Low back pain, unspecified: Secondary | ICD-10-CM

## 2020-02-18 NOTE — Therapy (Signed)
[place], [place], [address] Phone: [phone]   Fax:  [phone]  Physical Therapy Treatment  Patient Details  Name: Morgan Fields MRN: [id number] Date of Birth: 1950-11-01 Referring Provider (PT): Doree Albee, MD   Encounter Date: 02/18/2020  PT End of Session - 02/18/20 1015    Visit Number  8    Number of Visits  12    Date for PT Re-Evaluation  02/27/20    Authorization Type  Primary: Medicare; Secondary: T[employer] (No visit limit, no auth required)    Progress Note Due on Visit  10    PT Start Time  1006    PT Stop Time  1049    PT Time Calculation (min)  43 min    Activity Tolerance  Patient tolerated treatment well    Behavior During Therapy  [facility] for tasks assessed/performed       Past Medical History:  Diagnosis Date  . Complication of anesthesia    takes a long time to wake up from anesthesia  . Depression   . HLD (hyperlipidemia) 07/22/2019  . Osteoporosis 07/22/2019  . PMB (postmenopausal bleeding) 03/11/2016  . Post-menopause on HRT (hormone replacement therapy) 03/11/2016  . Skin cancer    Recent to anterior chest; cervical cancer  . Thickened endometrium 03/16/2016  . Vaginal Pap smear, abnormal   . Vitamin D deficiency disease 07/22/2019    Past Surgical History:  Procedure Laterality Date  . BREAST ENHANCEMENT SURGERY    . CERVICAL BIOPSY     precancerous years ago  . CHOLECYSTECTOMY  2011  . COLONOSCOPY N/A 05/08/2013   Procedure: COLONOSCOPY;  Surgeon: Rogene Houston, MD;  [facility];  Service: Endoscopy;  Laterality: N/A;  200  . HYSTEROSCOPY WITH D & C N/A 10/05/2016   Procedure: HYSTEROSCOPY; UTERINE CURETTAGE;  Surgeon: Florian Buff, MD;  Location: [facility];  Service: Gynecology;  Laterality: N/A;  . POLYPECTOMY N/A 10/05/2016   Procedure: REMOVAL OF ENDOMETRIAL POLYP;  Surgeon: Florian Buff, MD;  Location: [facility];  Service: Gynecology;  Laterality: N/A;  .  TUBAL LIGATION      There were no vitals filed for this visit.  Subjective Assessment - 02/18/20 1010    Subjective  Pt reports she had increased pain over the weekend.  Went to funeral yesterday and increased pain following sitting and increased pain following manicure.  Current pain scale 5/10, has taken Tylenol and MHP for pain contorl.  Reports she has been incorporating breathing that does assist with pain control.    Pertinent History  Mid-back pain which    Patient Stated Goals  To decrease pain    Currently in Pain?  Yes    Pain Score  5     Pain Location  Thoracic    Pain Orientation  Right    Pain Descriptors / Indicators  Sharp;Tightness   stiff, sharp pain   Pain Type  Chronic pain    Pain Onset  More than a month ago    Pain Frequency  Intermittent    Aggravating Factors   Unknown, different movement`    Pain Relieving Factors  Deep breathing                        [facility] Adult PT Treatment/Exercise - 02/18/20 0001      Lumbar Exercises: Stretches   Prone on Elbows Stretch  1 rep  Prone on Elbows Stretch Limitations  2 min    Other Lumbar Stretch Exercise  Child's pose 3x 30" center, Rt and Lt      Lumbar Exercises: Seated   Other Seated Lumbar Exercises  3D thoracic excursion 5x    Other Seated Lumbar Exercises  Sitting on therapeutic ball marching with ab set 10x 5"      Manual Therapy   Manual Therapy  Soft tissue mobilization    Manual therapy comments  All manual completed separately from other skilled interventions    Soft tissue mobilization  Thoracic paraspinals and periscapular mm                PT Short Term Goals - 01/29/20 [id number]      PT SHORT TERM GOAL #1   Title  Patient will report understanding and regular compliance of HEP to decrease pain and improve overall functional mobility.    Time  3    Period  Weeks    Status  On-going    Target Date  02/06/20      PT SHORT TERM GOAL #2   Title  Patient will report  improvement in her overall subjective complaint of at least 25% to improve QoL.    Time  3    Period  Weeks    Status  On-going    Target Date  02/06/20        PT Long Term Goals - 01/29/20 [id number]      PT LONG TERM GOAL #1   Title  Patient will report improvement in her overall subjective complaint of at least 50% to improve QoL.    Time  6    Period  Weeks    Status  On-going      PT LONG TERM GOAL #2   Title  Patient will demonstrate improvement of at least 10% on FOTO indicating percieved functional mobility and improved activity tolerance.    Time  6    Period  Weeks    Status  On-going      PT LONG TERM GOAL #3   Title  Patient will report ability to stand and perform household activities for at least 30 minutes for improved efficiency of completing chores at home.    Time  6    Period  Weeks    Status  On-going            Plan - 02/18/20 1152    Clinical Impression Statement  Session focus on thoracic mobility, core stregthening and manual for pain control.  Added 3D thoracic excursion for spinal mobility with reports of relief following exercise.  Marching on top of therapeutic ball was challenging for pt., ball was stabilzed by therapist.  EOS with manual to address soft tissue restrictions thoracic paraspinals and periscapular musculature.  Reports of pain resolved at EOS.  Encouraged hydration and continue with deep breathing to assist iwht rib mobility.    Personal Factors and Comorbidities  Age;Comorbidity 3+    Comorbidities  Osteoporosis; osteoarthritis; depression    Examination-Activity Limitations  Lift;Stand;Locomotion Level;Bend;Sit    Examination-Participation Restrictions  Meal Prep;Yard Work;Community Activity;Laundry;Shop    Stability/Clinical Decision Making  Evolving/Moderate complexity    Rehab Potential  Good    PT Frequency  2x / week    PT Duration  6 weeks    PT Treatment/Interventions  ADLs/Self Care Home Management;Aquatic  Therapy;Cryotherapy;Electrical Stimulation;Moist Heat;Traction;DME Instruction;Gait training;Stair training;Functional mobility training;Therapeutic activities;Therapeutic exercise;Balance training;Neuromuscular re-education;Patient/family education;Manual techniques;Passive range of motion;Dry needling;Taping  PT Next Visit Plan  Being aquatics next session.  COntinue thoracic mobility exercises, progress core stabilization with seated on physioball marching with ab set.    PT Home Exercise Plan  01/29/20: Thoracic book openers; 02/11/20: Foam roller lat stretch and self-mobilization       Patient will benefit from skilled therapeutic intervention in order to improve the following deficits and impairments:  Increased fascial restricitons, Improper body mechanics, Pain, Decreased mobility, Postural dysfunction, Decreased activity tolerance, Decreased range of motion, Hypomobility  Visit Diagnosis: Pain in thoracic spine  Other symptoms and signs involving the musculoskeletal system  Low back pain, unspecified back pain laterality, unspecified chronicity, unspecified whether sciatica present  Muscle weakness (generalized)     Problem List Patient Active Problem List   Diagnosis Date Noted  . Encounter for colorectal cancer screening 01/09/2020  . Encounter for well woman exam with routine gynecological exam 01/09/2020  . Sebaceous cyst 11/27/2019  . Vitamin D deficiency disease 07/22/2019  . Osteoporosis 07/22/2019  . HLD (hyperlipidemia) 07/22/2019  . Encounter for gynecological examination with Papanicolaou smear of cervix 11/28/2017  . Thickened endometrium 03/16/2016  . PMB (postmenopausal bleeding) 03/11/2016  . Post-menopause on HRT (hormone replacement therapy) 03/11/2016  . Loss of weight 04/15/2013  . Diarrhea 04/15/2013  . Skin cancer 04/15/2013   Ihor Austin, LPTA/CLT; CBIS [phone]  Aldona Lento 02/18/2020, 11:57 AM  [place] [address] [place], [place], [address] Phone: [phone]   Fax:  [phone]  Name: Morgan Fields MRN: [id number] Date of Birth: Jan 31, 1951

## 2020-02-20 ENCOUNTER — Encounter (HOSPITAL_COMMUNITY): Payer: Medicare Other

## 2020-02-20 ENCOUNTER — Encounter (HOSPITAL_COMMUNITY): Payer: Self-pay | Admitting: Physical Therapy

## 2020-02-20 ENCOUNTER — Ambulatory Visit (HOSPITAL_COMMUNITY): Payer: Medicare Other | Admitting: Physical Therapy

## 2020-02-20 ENCOUNTER — Other Ambulatory Visit: Payer: Self-pay

## 2020-02-20 DIAGNOSIS — M545 Low back pain, unspecified: Secondary | ICD-10-CM

## 2020-02-20 DIAGNOSIS — M546 Pain in thoracic spine: Secondary | ICD-10-CM | POA: Diagnosis not present

## 2020-02-20 DIAGNOSIS — R29898 Other symptoms and signs involving the musculoskeletal system: Secondary | ICD-10-CM

## 2020-02-20 DIAGNOSIS — M6281 Muscle weakness (generalized): Secondary | ICD-10-CM

## 2020-02-20 NOTE — Therapy (Signed)
[place], [place], [address] Phone: [phone]   Fax:  [phone]  Physical Therapy Treatment  Patient Details  Name: Morgan Fields MRN: [id number] Date of Birth: June 08, 1951 Referring Provider (PT): Doree Albee, MD   Encounter Date: 02/20/2020  PT End of Session - 02/20/20 1715    Visit Number  9    Number of Visits  12    Date for PT Re-Evaluation  02/27/20    Authorization Type  Primary: Medicare; Secondary: [employer] (No visit limit, no auth required)    Progress Note Due on Visit  10    PT Start Time  1200    PT Stop Time  1240    PT Time Calculation (min)  40 min    Activity Tolerance  Patient tolerated treatment well    Behavior During Therapy  [facility] for tasks assessed/performed       Past Medical History:  Diagnosis Date   Complication of anesthesia    takes a long time to wake up from anesthesia   Depression    HLD (hyperlipidemia) 07/22/2019   Osteoporosis 07/22/2019   PMB (postmenopausal bleeding) 03/11/2016   Post-menopause on HRT (hormone replacement therapy) 03/11/2016   Skin cancer    Recent to anterior chest; cervical cancer   Thickened endometrium 03/16/2016   Vaginal Pap smear, abnormal    Vitamin D deficiency disease 07/22/2019    Past Surgical History:  Procedure Laterality Date   BREAST ENHANCEMENT SURGERY     CERVICAL BIOPSY     precancerous years ago   CHOLECYSTECTOMY  2011   COLONOSCOPY N/A 05/08/2013   Procedure: COLONOSCOPY;  Surgeon: Rogene Houston, MD;  [facility];  Service: Endoscopy;  Laterality: N/A;  200   HYSTEROSCOPY WITH D & C N/A 10/05/2016   Procedure: HYSTEROSCOPY; UTERINE CURETTAGE;  Surgeon: Florian Buff, MD;  Location: [facility];  Service: Gynecology;  Laterality: N/A;   POLYPECTOMY N/A 10/05/2016   Procedure: REMOVAL OF ENDOMETRIAL POLYP;  Surgeon: Florian Buff, MD;  Location: [facility];  Service: Gynecology;  Laterality: N/A;    TUBAL LIGATION      There were no vitals filed for this visit.  Subjective Assessment - 02/20/20 1714    Subjective  Patient states message last session was helpful and that she liked it and it made her feel "energized" afterward that day. Reports ongoing pain in mid back area today.    Pertinent History  Mid-back pain which    Patient Stated Goals  To decrease pain    Currently in Pain?  Yes    Pain Score  4     Pain Location  Thoracic    Pain Orientation  Mid    Pain Descriptors / Indicators  Sharp    Pain Type  Chronic pain    Pain Onset  More than a month ago    Pain Frequency  Constant                    Adult Aquatic Therapy - 02/20/20 1721      Treatment   Exercises  lat stretch at pool wall 5 x 10", paddle push down x20, paddle push pull x20, sigle arm DB push down x10 ea, double arm DB pushdown, noodle twist x20, paddle twists x20, alternating DB push/ pull x20                    PT  Short Term Goals - 01/29/20 [id number]      PT SHORT TERM GOAL #1   Title  Patient will report understanding and regular compliance of HEP to decrease pain and improve overall functional mobility.    Time  3    Period  Weeks    Status  On-going    Target Date  02/06/20      PT SHORT TERM GOAL #2   Title  Patient will report improvement in her overall subjective complaint of at least 25% to improve QoL.    Time  3    Period  Weeks    Status  On-going    Target Date  02/06/20        PT Long Term Goals - 01/29/20 [id number]      PT LONG TERM GOAL #1   Title  Patient will report improvement in her overall subjective complaint of at least 50% to improve QoL.    Time  6    Period  Weeks    Status  On-going      PT LONG TERM GOAL #2   Title  Patient will demonstrate improvement of at least 10% on FOTO indicating percieved functional mobility and improved activity tolerance.    Time  6    Period  Weeks    Status  On-going      PT LONG TERM GOAL #3   Title  Patient  will report ability to stand and perform household activities for at least 30 minutes for improved efficiency of completing chores at home.    Time  6    Period  Weeks    Status  On-going            Plan - 02/20/20 1716    Clinical Impression Statement  Patient tolerated session well today with no increased complaint of pain. Patient educated on introduction to and application of aquatic therapy. Focused ther ex on core and postural strenghing as well as gentle thoracic mobility. Patient educated on proper form and function of all added exercise. Patient reports decreased pain post treatment. Patient will continue to benefit from skilled therapy services to progress postural strength and thoracic mobility to reduce pain and improve LOF with ADLs.    Personal Factors and Comorbidities  Age;Comorbidity 3+    Comorbidities  Osteoporosis; osteoarthritis; depression    Examination-Activity Limitations  Lift;Stand;Locomotion Level;Bend;Sit    Examination-Participation Restrictions  Meal Prep;Yard Work;Community Activity;Laundry;Shop    Stability/Clinical Decision Making  Evolving/Moderate complexity    Rehab Potential  Good    PT Frequency  2x / week    PT Duration  6 weeks    PT Treatment/Interventions  ADLs/Self Care Home Management;Aquatic Therapy;Cryotherapy;Electrical Stimulation;Moist Heat;Traction;DME Instruction;Gait training;Stair training;Functional mobility training;Therapeutic activities;Therapeutic exercise;Balance training;Neuromuscular re-education;Patient/family education;Manual techniques;Passive range of motion;Dry needling;Taping    PT Next Visit Plan  COntinue thoracic mobility exercises, progress core stabilization with seated on physioball marching with ab set.    PT Home Exercise Plan  01/29/20: Thoracic book openers; 02/11/20: Foam roller lat stretch and self-mobilization    Consulted and Agree with Plan of Care  Patient       Patient will benefit from skilled therapeutic  intervention in order to improve the following deficits and impairments:  Increased fascial restricitons, Improper body mechanics, Pain, Decreased mobility, Postural dysfunction, Decreased activity tolerance, Decreased range of motion, Hypomobility  Visit Diagnosis: Pain in thoracic spine  Other symptoms and signs involving the musculoskeletal system  Low back pain, unspecified back pain  laterality, unspecified chronicity, unspecified whether sciatica present  Muscle weakness (generalized)     Problem List Patient Active Problem List   Diagnosis Date Noted   Encounter for colorectal cancer screening 01/09/2020   Encounter for well woman exam with routine gynecological exam 01/09/2020   Sebaceous cyst 11/27/2019   Vitamin D deficiency disease 07/22/2019   Osteoporosis 07/22/2019   HLD (hyperlipidemia) 07/22/2019   Encounter for gynecological examination with Papanicolaou smear of cervix 11/28/2017   Thickened endometrium 03/16/2016   PMB (postmenopausal bleeding) 03/11/2016   Post-menopause on HRT (hormone replacement therapy) 03/11/2016   Loss of weight 04/15/2013   Diarrhea 04/15/2013   Skin cancer 04/15/2013    5:23 PM, 02/20/20 Josue Hector PT DPT  Physical Therapist with [facility]  [phone] [place], [place], [address] Phone: [phone]   Fax:  [phone]  Name: ALEANNA SURRENCY MRN: [id number] Date of Birth: Oct 22, 1950

## 2020-02-25 ENCOUNTER — Encounter (HOSPITAL_COMMUNITY): Payer: Self-pay | Admitting: Physical Therapy

## 2020-02-25 ENCOUNTER — Ambulatory Visit (HOSPITAL_COMMUNITY): Payer: Medicare Other | Admitting: Physical Therapy

## 2020-02-25 ENCOUNTER — Other Ambulatory Visit: Payer: Self-pay

## 2020-02-25 ENCOUNTER — Other Ambulatory Visit (INDEPENDENT_AMBULATORY_CARE_PROVIDER_SITE_OTHER): Payer: Self-pay

## 2020-02-25 DIAGNOSIS — M546 Pain in thoracic spine: Secondary | ICD-10-CM | POA: Diagnosis not present

## 2020-02-25 DIAGNOSIS — R29898 Other symptoms and signs involving the musculoskeletal system: Secondary | ICD-10-CM

## 2020-02-25 NOTE — Therapy (Signed)
[facility] [place], [place], [address] Phone: [phone]   Fax:  [phone]  Physical Therapy Treatment / Progress Note  Patient Details  Name: Morgan Fields MRN: [id number] Date of Birth: 13-Oct-1950 Referring Provider (PT): Doree Albee, MD   Encounter Date: 02/25/2020   Progress Note Reporting Period 01/16/20 to 02/25/20  See note below for Objective Data and Assessment of Progress/Goals.       PT End of Session - 02/25/20 0958    Visit Number  10    Number of Visits  18    Date for PT Re-Evaluation  03/17/20    Authorization Type  Primary: Medicare; Secondary: [employer] (No visit limit, no auth required)    Progress Note Due on Visit  20    PT Start Time  0945    PT Stop Time  1024    PT Time Calculation (min)  39 min    Activity Tolerance  Patient tolerated treatment well    Behavior During Therapy  WFL for tasks assessed/performed       Past Medical History:  Diagnosis Date  . Complication of anesthesia    takes a long time to wake up from anesthesia  . Depression   . HLD (hyperlipidemia) 07/22/2019  . Osteoporosis 07/22/2019  . PMB (postmenopausal bleeding) 03/11/2016  . Post-menopause on HRT (hormone replacement therapy) 03/11/2016  . Skin cancer    Recent to anterior chest; cervical cancer  . Thickened endometrium 03/16/2016  . Vaginal Pap smear, abnormal   . Vitamin D deficiency disease 07/22/2019    Past Surgical History:  Procedure Laterality Date  . BREAST ENHANCEMENT SURGERY    . CERVICAL BIOPSY     precancerous years ago  . CHOLECYSTECTOMY  2011  . COLONOSCOPY N/A 05/08/2013   Procedure: COLONOSCOPY;  Surgeon: Rogene Houston, MD;  [facility];  Service: Endoscopy;  Laterality: N/A;  200  . HYSTEROSCOPY WITH D & C N/A 10/05/2016   Procedure: HYSTEROSCOPY; UTERINE CURETTAGE;  Surgeon: Florian Buff, MD;  Location: [facility];  Service: Gynecology;  Laterality: N/A;  . POLYPECTOMY  N/A 10/05/2016   Procedure: REMOVAL OF ENDOMETRIAL POLYP;  Surgeon: Florian Buff, MD;  Location: [facility];  Service: Gynecology;  Laterality: N/A;  . TUBAL LIGATION      There were no vitals filed for this visit.  Subjective Assessment - 02/25/20 0949    Subjective  Patient reported that she has made a lot of improvement since beginning therapy and feels "energized" and that mentally she is doing better as well despite having pain. Patient reported that she has improved 50% since beginning therapy.    Pertinent History  Mid-back pain which has been ongoing    Limitations  House hold activities    How long can you stand comfortably?  20 minutes (was 15)    Diagnostic tests  x-ray: no acute abnormality    Patient Stated Goals  To decrease pain    Currently in Pain?  Yes    Pain Score  4     Pain Location  Thoracic    Pain Orientation  Mid    Pain Descriptors / Indicators  Sharp    Pain Type  Chronic pain    Pain Onset  More than a month ago    Pain Frequency  Constant         [facility] PT Assessment - 02/25/20 0001      Assessment  Medical Diagnosis  Chronic midline thoracic back pain    Referring Provider (PT)  Doree Albee, MD    Next MD Visit  Unknown    Prior Therapy  Yes, for back      Precautions   Precautions  None      Restrictions   Weight Bearing Restrictions  No      Home Environment   Living Environment  Private residence    Living Arrangements  Spouse/significant other    Type of [place]  One level      Prior Function   Level of [place]  Retired      Associate Professor   Overall Cognitive Status  Within Functional Limits for tasks assessed      Observation/Other Assessments   Observations  Increased thoracic kyphosis    Focus on Therapeutic Outcomes (FOTO)   43%    was 37%     Posture/Postural Control   Posture/Postural Control  Postural limitations    Postural Limitations  Decreased lumbar  lordosis;Increased thoracic kyphosis;Posterior pelvic tilt;Rounded Shoulders;Forward head      ROM / Strength   AROM / PROM / Strength  AROM      AROM   Thoracic Flexion  [facility]    Thoracic Extension  75% limited    Thoracic - Right Side Bend  [facility]    Thoracic - Left Side Bend  [facility]    Thoracic - Right Rotation  [facility]    Thoracic - Left Rotation  [facility]      Palpation   Spinal mobility  General hypomobility of thoracic spine    Palpation comment  Increased muscular restriction of periscapular and cervical paraspinals                    [facility] Adult PT Treatment/Exercise - 02/25/20 0001      Manual Therapy   Manual Therapy  Soft tissue mobilization;Joint mobilization    Manual therapy comments  All manual completed separately from other skilled interventions    Joint Mobilization  Thoracic T1-T10 spine CPA joint mobs grade II to decrease pain and improve mobility as well as rotational joint mobiliations.    Soft tissue mobilization  Thoracic paraspinals and periscapular mm                PT Short Term Goals - 02/25/20 0954      PT SHORT TERM GOAL #1   Title  Patient will report understanding and regular compliance of HEP to decrease pain and improve overall functional mobility.    Time  3    Period  Weeks    Status  Achieved    Target Date  02/06/20      PT SHORT TERM GOAL #2   Title  Patient will report improvement in her overall subjective complaint of at least 25% to improve QoL.    Baseline  02/25/20: 50% improvement reported    Time  3    Period  Weeks    Status  Achieved    Target Date  02/06/20        PT Long Term Goals - 02/25/20 0954      PT LONG TERM GOAL #1   Title  Patient will report improvement in her overall subjective complaint of at least 50% to improve QoL.    Baseline  02/25/20: Patient reports 50% improvement since beginning therapy    Time  6  Period  Weeks    Status  Achieved      PT LONG TERM GOAL #2   Title  Patient will  demonstrate improvement of at least 10% on FOTO indicating percieved functional mobility and improved activity tolerance.    Baseline  02/25/20: 6% improvement    Time  3    Period  Weeks    Status  On-going    Target Date  03/17/20      PT LONG TERM GOAL #3   Title  Patient will report ability to stand and perform household activities for at least 30 minutes for improved efficiency of completing chores at home.    Baseline  02/25/20: 20 minutes currently    Time  3    Period  Weeks    Status  On-going    Target Date  03/17/20      PT LONG TERM GOAL #4   Title  Patient will report improvement in her overall subjective complaint of at least 75% to improve QoL.    Time  3    Period  Weeks    Status  New    Target Date  03/17/20            Plan - 02/25/20 1036    Clinical Impression Statement  Performed a re-assessment of patient's progress towards goals. Patient achieved 2 out of 2 short term goals. Patient achieved 1 out of 3 long term goals and made progress towards other goals. Patient has reported at least 50% improvement since beginning therapy, but updated LTG to reach 75% improvement. Patient continues to report pain particularly in right periscapular region. Patient would benefit from continued skilled physical therapy to continue progressing towards functional goals. Ended session with manual therapy, with particular focus on joint mobilizations this session. Patient reported a reduction in pain at the end of the session.    Personal Factors and Comorbidities  Age;Comorbidity 3+    Comorbidities  Osteoporosis; osteoarthritis; depression    Examination-Activity Limitations  Lift;Stand;Locomotion Level;Bend;Sit    Examination-Participation Restrictions  Meal Prep;Yard Work;Community Activity;Laundry;Shop    Stability/Clinical Decision Making  Evolving/Moderate complexity    Rehab Potential  Good    PT Frequency  2x / week    PT Duration  3 weeks    PT Treatment/Interventions   ADLs/Self Care Home Management;Aquatic Therapy;Cryotherapy;Electrical Stimulation;Moist Heat;Traction;DME Instruction;Gait training;Stair training;Functional mobility training;Therapeutic activities;Therapeutic exercise;Balance training;Neuromuscular re-education;Patient/family education;Manual techniques;Passive range of motion;Dry needling;Taping    PT Next Visit Plan  Continue thoracic mobility exercises, progress core stabilization with seated on physioball marching with ab set.    PT Home Exercise Plan  01/29/20: Thoracic book openers; 02/11/20: Foam roller lat stretch and self-mobilization    Consulted and Agree with Plan of Care  Patient       Patient will benefit from skilled therapeutic intervention in order to improve the following deficits and impairments:  Increased fascial restricitons, Improper body mechanics, Pain, Decreased mobility, Postural dysfunction, Decreased activity tolerance, Decreased range of motion, Hypomobility  Visit Diagnosis: Pain in thoracic spine  Other symptoms and signs involving the musculoskeletal system     Problem List Patient Active Problem List   Diagnosis Date Noted  . Encounter for colorectal cancer screening 01/09/2020  . Encounter for well woman exam with routine gynecological exam 01/09/2020  . Sebaceous cyst 11/27/2019  . Vitamin D deficiency disease 07/22/2019  . Osteoporosis 07/22/2019  . HLD (hyperlipidemia) 07/22/2019  . Encounter for gynecological examination with Papanicolaou smear of cervix 11/28/2017  .  Thickened endometrium 03/16/2016  . PMB (postmenopausal bleeding) 03/11/2016  . Post-menopause on HRT (hormone replacement therapy) 03/11/2016  . Loss of weight 04/15/2013  . Diarrhea 04/15/2013  . Skin cancer 04/15/2013   [name] PT, DPT 10:38 AM, 02/25/20 [place], [place], [address] Phone: [phone]   Fax:  [phone]  Name: Morgan Fields MRN: [id number] Date of Birth: 1951-09-05

## 2020-02-27 ENCOUNTER — Encounter (HOSPITAL_COMMUNITY): Payer: Self-pay | Admitting: Physical Therapy

## 2020-02-27 ENCOUNTER — Ambulatory Visit (HOSPITAL_COMMUNITY): Payer: Medicare Other | Admitting: Physical Therapy

## 2020-02-27 ENCOUNTER — Other Ambulatory Visit: Payer: Self-pay

## 2020-02-27 ENCOUNTER — Encounter (HOSPITAL_COMMUNITY): Payer: Medicare Other | Admitting: Physical Therapy

## 2020-02-27 DIAGNOSIS — M546 Pain in thoracic spine: Secondary | ICD-10-CM

## 2020-02-27 DIAGNOSIS — R29898 Other symptoms and signs involving the musculoskeletal system: Secondary | ICD-10-CM

## 2020-02-27 NOTE — Therapy (Signed)
[place], [place], [address] Phone: [phone]   Fax:  [phone]  Physical Therapy Treatment  Patient Details  Name: Morgan Fields MRN: [id number] Date of Birth: 1951/06/15 Referring Provider (PT): Doree Albee, MD   Encounter Date: 02/27/2020   PT End of Session - 02/27/20 1900    Visit Number 11    Number of Visits 18    Date for PT Re-Evaluation 03/17/20    Authorization Type Primary: Medicare; Secondary: T[employer]are (No visit limit, no auth required)    Progress Note Due on Visit 20    PT Start Time 1200    PT Stop Time 1245    PT Time Calculation (min) 45 min    Activity Tolerance Patient tolerated treatment well    Behavior During Therapy [facility] for tasks assessed/performed           Past Medical History:  Diagnosis Date  . Complication of anesthesia    takes a long time to wake up from anesthesia  . Depression   . HLD (hyperlipidemia) 07/22/2019  . Osteoporosis 07/22/2019  . PMB (postmenopausal bleeding) 03/11/2016  . Post-menopause on HRT (hormone replacement therapy) 03/11/2016  . Skin cancer    Recent to anterior chest; cervical cancer  . Thickened endometrium 03/16/2016  . Vaginal Pap smear, abnormal   . Vitamin D deficiency disease 07/22/2019    Past Surgical History:  Procedure Laterality Date  . BREAST ENHANCEMENT SURGERY    . CERVICAL BIOPSY     precancerous years ago  . CHOLECYSTECTOMY  2011  . COLONOSCOPY N/A 05/08/2013   Procedure: COLONOSCOPY;  Surgeon: Rogene Houston, MD;  [facility];  Service: Endoscopy;  Laterality: N/A;  200  . HYSTEROSCOPY WITH D & C N/A 10/05/2016   Procedure: HYSTEROSCOPY; UTERINE CURETTAGE;  Surgeon: Florian Buff, MD;  Location: [facility];  Service: Gynecology;  Laterality: N/A;  . POLYPECTOMY N/A 10/05/2016   Procedure: REMOVAL OF ENDOMETRIAL POLYP;  Surgeon: Florian Buff, MD;  Location: [facility];  Service: Gynecology;  Laterality: N/A;  .  TUBAL LIGATION      There were no vitals filed for this visit.   Subjective Assessment - 02/27/20 1857    Subjective Patient says she is doing better overall but continues to have pain in mid back area. Says the exercises have been helping.    Pertinent History Mid-back pain which has been ongoing    Limitations House hold activities    How long can you stand comfortably? 20 minutes (was 15)    Diagnostic tests x-ray: no acute abnormality    Patient Stated Goals To decrease pain    Currently in Pain? Yes    Pain Score 4     Pain Location Thoracic    Pain Orientation Mid    Pain Descriptors / Indicators Sharp    Pain Type Chronic pain    Pain Onset More than a month ago                         Adult Aquatic Therapy - 02/27/20 1904      Treatment   Exercises lat stretch at pool wall 5 x 10", pec stretch at wall 5 x 10" each, 3D thoracic excursions x 5 each, paddle push pull x20, single arm DB push down x10 ea, double arm DB pushdown, noodle twist and chop x20, paddle twists x20, alternating DB push/ pull x20  PT Short Term Goals - 02/25/20 0954      PT SHORT TERM GOAL #1   Title Patient will report understanding and regular compliance of HEP to decrease pain and improve overall functional mobility.    Time 3    Period Weeks    Status Achieved    Target Date 02/06/20      PT SHORT TERM GOAL #2   Title Patient will report improvement in her overall subjective complaint of at least 25% to improve QoL.    Baseline 02/25/20: 50% improvement reported    Time 3    Period Weeks    Status Achieved    Target Date 02/06/20             PT Long Term Goals - 02/25/20 0954      PT LONG TERM GOAL #1   Title Patient will report improvement in her overall subjective complaint of at least 50% to improve QoL.    Baseline 02/25/20: Patient reports 50% improvement since beginning therapy    Time 6    Period Weeks    Status Achieved       PT LONG TERM GOAL #2   Title Patient will demonstrate improvement of at least 10% on FOTO indicating percieved functional mobility and improved activity tolerance.    Baseline 02/25/20: 6% improvement    Time 3    Period Weeks    Status On-going    Target Date 03/17/20      PT LONG TERM GOAL #3   Title Patient will report ability to stand and perform household activities for at least 30 minutes for improved efficiency of completing chores at home.    Baseline 02/25/20: 20 minutes currently    Time 3    Period Weeks    Status On-going    Target Date 03/17/20      PT LONG TERM GOAL #4   Title Patient will report improvement in her overall subjective complaint of at least 75% to improve QoL.    Time 3    Period Weeks    Status New    Target Date 03/17/20                 Plan - 02/27/20 1900    Clinical Impression Statement Patient tolerated session well today with no increased complaint of pain. Added thoracic mobilizations and pec stretching today. Patient educated on proper form and function of all added exercise. Patient shows good improvement in thoracic mobility today. Patient will continue to benefit from skilled therapy services to progress thoracic mobility and postural stretching to reduce pain and improve LOF with ADLs.    Personal Factors and Comorbidities Age;Comorbidity 3+    Comorbidities Osteoporosis; osteoarthritis; depression    Examination-Activity Limitations Lift;Stand;Locomotion Level;Bend;Sit    Examination-Participation Restrictions Meal Prep;Yard Work;Community Activity;Laundry;Shop    Stability/Clinical Decision Making Evolving/Moderate complexity    Rehab Potential Good    PT Frequency 2x / week    PT Duration 3 weeks    PT Treatment/Interventions ADLs/Self Care Home Management;Aquatic Therapy;Cryotherapy;Electrical Stimulation;Moist Heat;Traction;DME Instruction;Gait training;Stair training;Functional mobility training;Therapeutic  activities;Therapeutic exercise;Balance training;Neuromuscular re-education;Patient/family education;Manual techniques;Passive range of motion;Dry needling;Taping    PT Next Visit Plan Continue thoracic mobility exercises, progress core stabilization with seated on physioball marching with ab set. Add shoulder abductions with aquatic    PT Home Exercise Plan 01/29/20: Thoracic book openers; 02/11/20: Foam roller lat stretch and self-mobilization    Consulted and Agree with Plan of Care Patient  Patient will benefit from skilled therapeutic intervention in order to improve the following deficits and impairments:  Increased fascial restricitons, Improper body mechanics, Pain, Decreased mobility, Postural dysfunction, Decreased activity tolerance, Decreased range of motion, Hypomobility  Visit Diagnosis: Pain in thoracic spine  Other symptoms and signs involving the musculoskeletal system     Problem List Patient Active Problem List   Diagnosis Date Noted  . Encounter for colorectal cancer screening 01/09/2020  . Encounter for well woman exam with routine gynecological exam 01/09/2020  . Sebaceous cyst 11/27/2019  . Vitamin D deficiency disease 07/22/2019  . Osteoporosis 07/22/2019  . HLD (hyperlipidemia) 07/22/2019  . Encounter for gynecological examination with Papanicolaou smear of cervix 11/28/2017  . Thickened endometrium 03/16/2016  . PMB (postmenopausal bleeding) 03/11/2016  . Post-menopause on HRT (hormone replacement therapy) 03/11/2016  . Loss of weight 04/15/2013  . Diarrhea 04/15/2013  . Skin cancer 04/15/2013    7:07 PM, 02/27/20 Josue Hector PT DPT  Physical Therapist with [facility]  [phone] [place] [address] [place], [place], [address] Phone: [phone]   Fax:  [phone]  Name: Morgan Fields MRN: [id number] Date of Birth: 1951/07/03

## 2020-03-03 ENCOUNTER — Ambulatory Visit (HOSPITAL_COMMUNITY): Payer: Medicare Other | Admitting: Physical Therapy

## 2020-03-03 ENCOUNTER — Other Ambulatory Visit: Payer: Self-pay

## 2020-03-03 ENCOUNTER — Encounter (HOSPITAL_COMMUNITY): Payer: Self-pay | Admitting: Physical Therapy

## 2020-03-03 DIAGNOSIS — M546 Pain in thoracic spine: Secondary | ICD-10-CM | POA: Diagnosis not present

## 2020-03-03 DIAGNOSIS — R29898 Other symptoms and signs involving the musculoskeletal system: Secondary | ICD-10-CM

## 2020-03-03 DIAGNOSIS — M6281 Muscle weakness (generalized): Secondary | ICD-10-CM

## 2020-03-03 DIAGNOSIS — M545 Low back pain, unspecified: Secondary | ICD-10-CM

## 2020-03-03 NOTE — Therapy (Signed)
[place], [place], [address] Phone: [phone]   Fax:  [phone]  Physical Therapy Treatment  Patient Details  Name: Morgan Fields MRN: [id number] Date of Birth: December 07, 1950 Referring Provider (PT): Doree Albee, MD   Encounter Date: 03/03/2020   PT End of Session - 03/03/20 1354    Visit Number 12    Number of Visits 18    Date for PT Re-Evaluation 03/17/20    Authorization Type Primary: Medicare; Secondary: Tricare (No visit limit, no auth required)    Progress Note Due on Visit 20    PT Start Time 1347    PT Stop Time 1425    PT Time Calculation (min) 38 min    Activity Tolerance Patient tolerated treatment well    Behavior During Therapy [facility] for tasks assessed/performed           Past Medical History:  Diagnosis Date  . Complication of anesthesia    takes a long time to wake up from anesthesia  . Depression   . HLD (hyperlipidemia) 07/22/2019  . Osteoporosis 07/22/2019  . PMB (postmenopausal bleeding) 03/11/2016  . Post-menopause on HRT (hormone replacement therapy) 03/11/2016  . Skin cancer    Recent to anterior chest; cervical cancer  . Thickened endometrium 03/16/2016  . Vaginal Pap smear, abnormal   . Vitamin D deficiency disease 07/22/2019    Past Surgical History:  Procedure Laterality Date  . BREAST ENHANCEMENT SURGERY    . CERVICAL BIOPSY     precancerous years ago  . CHOLECYSTECTOMY  2011  . COLONOSCOPY N/A 05/08/2013   Procedure: COLONOSCOPY;  Surgeon: Rogene Houston, MD;  [facility];  Service: Endoscopy;  Laterality: N/A;  200  . HYSTEROSCOPY WITH D & C N/A 10/05/2016   Procedure: HYSTEROSCOPY; UTERINE CURETTAGE;  Surgeon: Florian Buff, MD;  Location: [facility];  Service: Gynecology;  Laterality: N/A;  . POLYPECTOMY N/A 10/05/2016   Procedure: REMOVAL OF ENDOMETRIAL POLYP;  Surgeon: Florian Buff, MD;  Location: [facility];  Service: Gynecology;  Laterality: N/A;  .  TUBAL LIGATION      There were no vitals filed for this visit.   Subjective Assessment - 03/03/20 1350    Subjective Patient  reported that she is doing okay today, reported that she has some pain and that it was a 6/10, but that currently she would rate it as a 4/10 because she did her exercises at home.    Pertinent History Mid-back pain which has been ongoing    Limitations House hold activities    How long can you stand comfortably? 20 minutes (was 15)    Diagnostic tests x-ray: no acute abnormality    Patient Stated Goals To decrease pain    Currently in Pain? Yes    Pain Score 4     Pain Location Back    Pain Orientation Medial    Pain Descriptors / Indicators Sharp    Pain Type Chronic pain    Pain Onset More than a month ago                             [facility] Adult PT Treatment/Exercise - 03/03/20 0001      Lumbar Exercises: Stretches   Other Lumbar Stretch Exercise Thoracic bookopeners x10 5-10 second holds    Other Lumbar Stretch Exercise Foam roller lats stretch 3x30''. Foam roller mobility exercise rolling through  thoracic spine 3x30''       Lumbar Exercises: Seated   Other Seated Lumbar Exercises Seated marching over 6'' hurdle with ab set x 10      Manual Therapy   Manual Therapy Soft tissue mobilization;Joint mobilization    Manual therapy comments All manual completed separately from other skilled interventions    Joint Mobilization Thoracic T1-T10 spine CPA joint mobs grade II to decrease pain and improve mobility as well as rotational joint mobiliations.    Soft tissue mobilization Thoracic paraspinals and periscapular mm                     PT Short Term Goals - 02/25/20 0954      PT SHORT TERM GOAL #1   Title Patient will report understanding and regular compliance of HEP to decrease pain and improve overall functional mobility.    Time 3    Period Weeks    Status Achieved    Target Date 02/06/20      PT SHORT TERM GOAL #2    Title Patient will report improvement in her overall subjective complaint of at least 25% to improve QoL.    Baseline 02/25/20: 50% improvement reported    Time 3    Period Weeks    Status Achieved    Target Date 02/06/20             PT Long Term Goals - 02/25/20 0954      PT LONG TERM GOAL #1   Title Patient will report improvement in her overall subjective complaint of at least 50% to improve QoL.    Baseline 02/25/20: Patient reports 50% improvement since beginning therapy    Time 6    Period Weeks    Status Achieved      PT LONG TERM GOAL #2   Title Patient will demonstrate improvement of at least 10% on FOTO indicating percieved functional mobility and improved activity tolerance.    Baseline 02/25/20: 6% improvement    Time 3    Period Weeks    Status On-going    Target Date 03/17/20      PT LONG TERM GOAL #3   Title Patient will report ability to stand and perform household activities for at least 30 minutes for improved efficiency of completing chores at home.    Baseline 02/25/20: 20 minutes currently    Time 3    Period Weeks    Status On-going    Target Date 03/17/20      PT LONG TERM GOAL #4   Title Patient will report improvement in her overall subjective complaint of at least 75% to improve QoL.    Time 3    Period Weeks    Status New    Target Date 03/17/20                 Plan - 03/03/20 1409    Clinical Impression Statement Patient reporting feeling improvements in daily mobility especially since beginning aquatic therapy. However, patient still reporting difficulty with walking routine due to pain. This session added seated marching with abdominal set for abdominal strengthening with LE activation similar to walking. Also added clams for hip abduction strengthening with ambulation. Patient did report feeling muscle activation with this, but no pain.    Personal Factors and Comorbidities Age;Comorbidity 3+    Comorbidities Osteoporosis;  osteoarthritis; depression    Examination-Activity Limitations Lift;Stand;Locomotion Level;Bend;Sit    Examination-Participation Restrictions Meal Prep;Yard Work;Community Activity;Laundry;Shop  Stability/Clinical Decision Making Evolving/Moderate complexity    Rehab Potential Good    PT Frequency 2x / week    PT Duration 3 weeks    PT Treatment/Interventions ADLs/Self Care Home Management;Aquatic Therapy;Cryotherapy;Electrical Stimulation;Moist Heat;Traction;DME Instruction;Gait training;Stair training;Functional mobility training;Therapeutic activities;Therapeutic exercise;Balance training;Neuromuscular re-education;Patient/family education;Manual techniques;Passive range of motion;Dry needling;Taping    PT Next Visit Plan Continue thoracic mobility exercises, progress core stabilization with seated on physioball marching with ab set. Add shoulder abductions with aquatic    PT Home Exercise Plan 01/29/20: Thoracic book openers; 02/11/20: Foam roller lat stretch and self-mobilization; 03/03/20: Clams x15    Consulted and Agree with Plan of Care Patient           Patient will benefit from skilled therapeutic intervention in order to improve the following deficits and impairments:  Increased fascial restricitons, Improper body mechanics, Pain, Decreased mobility, Postural dysfunction, Decreased activity tolerance, Decreased range of motion, Hypomobility  Visit Diagnosis: Pain in thoracic spine  Other symptoms and signs involving the musculoskeletal system  Low back pain, unspecified back pain laterality, unspecified chronicity, unspecified whether sciatica present  Muscle weakness (generalized)     Problem List Patient Active Problem List   Diagnosis Date Noted  . Encounter for colorectal cancer screening 01/09/2020  . Encounter for well woman exam with routine gynecological exam 01/09/2020  . Sebaceous cyst 11/27/2019  . Vitamin D deficiency disease 07/22/2019  . Osteoporosis  07/22/2019  . HLD (hyperlipidemia) 07/22/2019  . Encounter for gynecological examination with Papanicolaou smear of cervix 11/28/2017  . Thickened endometrium 03/16/2016  . PMB (postmenopausal bleeding) 03/11/2016  . Post-menopause on HRT (hormone replacement therapy) 03/11/2016  . Loss of weight 04/15/2013  . Diarrhea 04/15/2013  . Skin cancer 04/15/2013   [name] PT, DPT 2:44 PM, 03/03/20 [place], [place], [address] Phone: [phone]   Fax:  [phone]  Name: Morgan Fields MRN: [id number] Date of Birth: 14-Nov-1950

## 2020-03-03 NOTE — Patient Instructions (Signed)
Abduction: Clam (Eccentric) - Side-Lying    Lie on side with knees bent. Lift top knee, keeping feet together. Keep trunk steady. Slowly lower for 3-5 seconds. _15__ reps per set, __1-2_ sets per day, _7__ days per week.  http://ecce.exer.us/65   Copyright  VHI. All rights reserved.

## 2020-03-05 ENCOUNTER — Other Ambulatory Visit: Payer: Self-pay

## 2020-03-05 ENCOUNTER — Ambulatory Visit (HOSPITAL_COMMUNITY): Payer: Medicare Other | Admitting: Physical Therapy

## 2020-03-05 ENCOUNTER — Encounter (HOSPITAL_COMMUNITY): Payer: Self-pay | Admitting: Physical Therapy

## 2020-03-05 DIAGNOSIS — M546 Pain in thoracic spine: Secondary | ICD-10-CM | POA: Diagnosis not present

## 2020-03-05 DIAGNOSIS — R29898 Other symptoms and signs involving the musculoskeletal system: Secondary | ICD-10-CM

## 2020-03-05 DIAGNOSIS — M6281 Muscle weakness (generalized): Secondary | ICD-10-CM

## 2020-03-05 DIAGNOSIS — M545 Low back pain, unspecified: Secondary | ICD-10-CM

## 2020-03-05 NOTE — Therapy (Signed)
[place], [place], [address] Phone: [phone]   Fax:  [phone]  Physical Therapy Treatment  Patient Details  Name: Morgan Fields MRN: [id number] Date of Birth: Jan 02, 1951 Referring Provider (PT): Doree Albee, MD   Encounter Date: 03/05/2020   PT End of Session - 03/05/20 1255    Visit Number 13    Number of Visits 18    Date for PT Re-Evaluation 03/17/20    Authorization Type Primary: Medicare; Secondary: Tricare (No visit limit, no auth required)    Progress Note Due on Visit 20    PT Start Time 1300    PT Stop Time 1345    PT Time Calculation (min) 45 min    Activity Tolerance Patient tolerated treatment well    Behavior During Therapy [facility] for tasks assessed/performed           Past Medical History:  Diagnosis Date  . Complication of anesthesia    takes a long time to wake up from anesthesia  . Depression   . HLD (hyperlipidemia) 07/22/2019  . Osteoporosis 07/22/2019  . PMB (postmenopausal bleeding) 03/11/2016  . Post-menopause on HRT (hormone replacement therapy) 03/11/2016  . Skin cancer    Recent to anterior chest; cervical cancer  . Thickened endometrium 03/16/2016  . Vaginal Pap smear, abnormal   . Vitamin D deficiency disease 07/22/2019    Past Surgical History:  Procedure Laterality Date  . BREAST ENHANCEMENT SURGERY    . CERVICAL BIOPSY     precancerous years ago  . CHOLECYSTECTOMY  2011  . COLONOSCOPY N/A 05/08/2013   Procedure: COLONOSCOPY;  Surgeon: Rogene Houston, MD;  [facility];  Service: Endoscopy;  Laterality: N/A;  200  . HYSTEROSCOPY WITH D & C N/A 10/05/2016   Procedure: HYSTEROSCOPY; UTERINE CURETTAGE;  Surgeon: Florian Buff, MD;  Location: [facility];  Service: Gynecology;  Laterality: N/A;  . POLYPECTOMY N/A 10/05/2016   Procedure: REMOVAL OF ENDOMETRIAL POLYP;  Surgeon: Florian Buff, MD;  Location: [facility];  Service: Gynecology;  Laterality: N/A;  .  TUBAL LIGATION      There were no vitals filed for this visit.   Subjective Assessment - 03/05/20 1254    Subjective Patient says she is feeling pretty good today, pain is about a 2 currently under the RT shoulder blade.    Pertinent History Mid-back pain which has been ongoing    Limitations House hold activities    How long can you stand comfortably? 20 minutes (was 15)    Diagnostic tests x-ray: no acute abnormality    Patient Stated Goals To decrease pain    Currently in Pain? Yes    Pain Score 2     Pain Location Scapula    Pain Orientation Right    Pain Onset More than a month ago                         Adult Aquatic Therapy - 03/05/20 1259      Treatment   Exercises lat stretch at pool rail 5 x 10", pec stretch at wall 5 x 10" each, 3D thoracic excursions x 10 each, paddle push pull x20, single arm DB push down x10 ea, double arm DB pushdown, noodle twist and chop x20, alternating DB push/ pull x20 , DB horiz shoulder abduction x 20  PT Short Term Goals - 02/25/20 0954      PT SHORT TERM GOAL #1   Title Patient will report understanding and regular compliance of HEP to decrease pain and improve overall functional mobility.    Time 3    Period Weeks    Status Achieved    Target Date 02/06/20      PT SHORT TERM GOAL #2   Title Patient will report improvement in her overall subjective complaint of at least 25% to improve QoL.    Baseline 02/25/20: 50% improvement reported    Time 3    Period Weeks    Status Achieved    Target Date 02/06/20             PT Long Term Goals - 02/25/20 0954      PT LONG TERM GOAL #1   Title Patient will report improvement in her overall subjective complaint of at least 50% to improve QoL.    Baseline 02/25/20: Patient reports 50% improvement since beginning therapy    Time 6    Period Weeks    Status Achieved      PT LONG TERM GOAL #2   Title Patient will demonstrate  improvement of at least 10% on FOTO indicating percieved functional mobility and improved activity tolerance.    Baseline 02/25/20: 6% improvement    Time 3    Period Weeks    Status On-going    Target Date 03/17/20      PT LONG TERM GOAL #3   Title Patient will report ability to stand and perform household activities for at least 30 minutes for improved efficiency of completing chores at home.    Baseline 02/25/20: 20 minutes currently    Time 3    Period Weeks    Status On-going    Target Date 03/17/20      PT LONG TERM GOAL #4   Title Patient will report improvement in her overall subjective complaint of at least 75% to improve QoL.    Time 3    Period Weeks    Status New    Target Date 03/17/20                 Plan - 03/05/20 1255    Clinical Impression Statement Patient continues to show improvement in thoracic mobility and postural awareness. Patient tolerated session well today with no increased complaints of pain. Added dumbell shoulder horizontal abuction for postural strengthening today. Patient educated on proper form and function. Patient notes improved ability to perform standing thoracic mobility exercise due to bouyant effect of water on pain relief for knees. Patient will continue to benefit from skilled therapy services to progress postural strengthening to reduce pain and improve LOF with ADLs.    Personal Factors and Comorbidities Age;Comorbidity 3+    Comorbidities Osteoporosis; osteoarthritis; depression    Examination-Activity Limitations Lift;Stand;Locomotion Level;Bend;Sit    Examination-Participation Restrictions Meal Prep;Yard Work;Community Activity;Laundry;Shop    Stability/Clinical Decision Making Evolving/Moderate complexity    Rehab Potential Good    PT Frequency 2x / week    PT Duration 3 weeks    PT Treatment/Interventions ADLs/Self Care Home Management;Aquatic Therapy;Cryotherapy;Electrical Stimulation;Moist Heat;Traction;DME Instruction;Gait  training;Stair training;Functional mobility training;Therapeutic activities;Therapeutic exercise;Balance training;Neuromuscular re-education;Patient/family education;Manual techniques;Passive range of motion;Dry needling;Taping    PT Next Visit Plan Continue thoracic mobility exercises, progress core stabilization with seated on physioball marching with ab set    PT Home Exercise Plan 01/29/20: Thoracic book openers; 02/11/20: Foam roller lat stretch and  self-mobilization; 03/03/20: Clams x15    Consulted and Agree with Plan of Care Patient           Patient will benefit from skilled therapeutic intervention in order to improve the following deficits and impairments:  Increased fascial restricitons, Improper body mechanics, Pain, Decreased mobility, Postural dysfunction, Decreased activity tolerance, Decreased range of motion, Hypomobility  Visit Diagnosis: Pain in thoracic spine  Other symptoms and signs involving the musculoskeletal system  Low back pain, unspecified back pain laterality, unspecified chronicity, unspecified whether sciatica present  Muscle weakness (generalized)     Problem List Patient Active Problem List   Diagnosis Date Noted  . Encounter for colorectal cancer screening 01/09/2020  . Encounter for well woman exam with routine gynecological exam 01/09/2020  . Sebaceous cyst 11/27/2019  . Vitamin D deficiency disease 07/22/2019  . Osteoporosis 07/22/2019  . HLD (hyperlipidemia) 07/22/2019  . Encounter for gynecological examination with Papanicolaou smear of cervix 11/28/2017  . Thickened endometrium 03/16/2016  . PMB (postmenopausal bleeding) 03/11/2016  . Post-menopause on HRT (hormone replacement therapy) 03/11/2016  . Loss of weight 04/15/2013  . Diarrhea 04/15/2013  . Skin cancer 04/15/2013   1:01 PM, 03/05/20 Josue Hector PT DPT  Physical Therapist with [facility]  [phone] [place] [address] [place], [place], [address] Phone: [phone]   Fax:  [phone]  Name: LILYONNA STEIDLE MRN: [id number] Date of Birth: January 02, 1951

## 2020-03-09 ENCOUNTER — Other Ambulatory Visit (INDEPENDENT_AMBULATORY_CARE_PROVIDER_SITE_OTHER): Payer: Self-pay

## 2020-03-09 MED ORDER — ESTRADIOL 2 MG PO TABS: 3.0000 mg | ORAL_TABLET | Freq: Every day | ORAL | 1 refills | Status: DC

## 2020-03-09 MED ORDER — PROGESTERONE 200 MG PO CAPS: 400.0000 mg | ORAL_CAPSULE | Freq: Every day | ORAL | 3 refills | Status: DC

## 2020-03-10 ENCOUNTER — Other Ambulatory Visit: Payer: Self-pay

## 2020-03-10 ENCOUNTER — Encounter (HOSPITAL_COMMUNITY): Payer: Self-pay | Admitting: Physical Therapy

## 2020-03-10 ENCOUNTER — Ambulatory Visit (HOSPITAL_COMMUNITY): Payer: Medicare Other | Admitting: Physical Therapy

## 2020-03-10 DIAGNOSIS — M546 Pain in thoracic spine: Secondary | ICD-10-CM

## 2020-03-10 DIAGNOSIS — R29898 Other symptoms and signs involving the musculoskeletal system: Secondary | ICD-10-CM

## 2020-03-10 NOTE — Therapy (Signed)
[place], [place], [address] Phone: [phone]   Fax:  [phone]  Physical Therapy Treatment  Patient Details  Name: Morgan Fields MRN: [id number] Date of Birth: 26-Dec-1950 Referring Provider (PT): Doree Albee, MD   Encounter Date: 03/10/2020   PT End of Session - 03/10/20 1359    Visit Number 14    Number of Visits 18    Date for PT Re-Evaluation 03/17/20    Authorization Type Primary: Medicare; Secondary: Tricare (No visit limit, no auth required)    Progress Note Due on Visit 20    PT Start Time 1351    PT Stop Time 1429    PT Time Calculation (min) 38 min    Activity Tolerance Patient tolerated treatment well    Behavior During Therapy [facility] for tasks assessed/performed           Past Medical History:  Diagnosis Date  . Complication of anesthesia    takes a long time to wake up from anesthesia  . Depression   . HLD (hyperlipidemia) 07/22/2019  . Osteoporosis 07/22/2019  . PMB (postmenopausal bleeding) 03/11/2016  . Post-menopause on HRT (hormone replacement therapy) 03/11/2016  . Skin cancer    Recent to anterior chest; cervical cancer  . Thickened endometrium 03/16/2016  . Vaginal Pap smear, abnormal   . Vitamin D deficiency disease 07/22/2019    Past Surgical History:  Procedure Laterality Date  . BREAST ENHANCEMENT SURGERY    . CERVICAL BIOPSY     precancerous years ago  . CHOLECYSTECTOMY  2011  . COLONOSCOPY N/A 05/08/2013   Procedure: COLONOSCOPY;  Surgeon: Rogene Houston, MD;  [facility];  Service: Endoscopy;  Laterality: N/A;  200  . HYSTEROSCOPY WITH D & C N/A 10/05/2016   Procedure: HYSTEROSCOPY; UTERINE CURETTAGE;  Surgeon: Florian Buff, MD;  Location: [facility];  Service: Gynecology;  Laterality: N/A;  . POLYPECTOMY N/A 10/05/2016   Procedure: REMOVAL OF ENDOMETRIAL POLYP;  Surgeon: Florian Buff, MD;  Location: [facility];  Service: Gynecology;  Laterality: N/A;  .  TUBAL LIGATION      There were no vitals filed for this visit.   Subjective Assessment - 03/10/20 1353    Subjective Patient reported that she is having a little twinge of pain in her RT shoulder blade.    Pertinent History Mid-back pain which has been ongoing    Limitations House hold activities    How long can you stand comfortably? 20 minutes (was 15)    Diagnostic tests x-ray: no acute abnormality    Patient Stated Goals To decrease pain    Currently in Pain? Yes    Pain Score 2     Pain Location Shoulder    Pain Orientation Right    Pain Descriptors / Indicators Sharp    Pain Type Chronic pain    Pain Onset More than a month ago                             [facility] Adult PT Treatment/Exercise - 03/10/20 0001      Lumbar Exercises: Stretches   Other Lumbar Stretch Exercise Foam roller lats stretch 3x30''      Lumbar Exercises: Seated   Other Seated Lumbar Exercises Seated marching 2'' holds x20 alternating LEs      Lumbar Exercises: Sidelying   Clam Right;Left;15 reps;2 seconds      Lumbar  Exercises: Prone   Other Prone Lumbar Exercises W back 15x      Manual Therapy   Manual Therapy Soft tissue mobilization;Joint mobilization    Manual therapy comments All manual completed separately from other skilled interventions    Joint Mobilization Thoracic T1-T10 spine CPA joint mobs grade II to decrease pain and improve mobility as well as rotational joint mobiliations.    Soft tissue mobilization Thoracic paraspinals and periscapular mm                     PT Short Term Goals - 02/25/20 0954      PT SHORT TERM GOAL #1   Title Patient will report understanding and regular compliance of HEP to decrease pain and improve overall functional mobility.    Time 3    Period Weeks    Status Achieved    Target Date 02/06/20      PT SHORT TERM GOAL #2   Title Patient will report improvement in her overall subjective complaint of at least 25% to improve  QoL.    Baseline 02/25/20: 50% improvement reported    Time 3    Period Weeks    Status Achieved    Target Date 02/06/20             PT Long Term Goals - 02/25/20 0954      PT LONG TERM GOAL #1   Title Patient will report improvement in her overall subjective complaint of at least 50% to improve QoL.    Baseline 02/25/20: Patient reports 50% improvement since beginning therapy    Time 6    Period Weeks    Status Achieved      PT LONG TERM GOAL #2   Title Patient will demonstrate improvement of at least 10% on FOTO indicating percieved functional mobility and improved activity tolerance.    Baseline 02/25/20: 6% improvement    Time 3    Period Weeks    Status On-going    Target Date 03/17/20      PT LONG TERM GOAL #3   Title Patient will report ability to stand and perform household activities for at least 30 minutes for improved efficiency of completing chores at home.    Baseline 02/25/20: 20 minutes currently    Time 3    Period Weeks    Status On-going    Target Date 03/17/20      PT LONG TERM GOAL #4   Title Patient will report improvement in her overall subjective complaint of at least 75% to improve QoL.    Time 3    Period Weeks    Status New    Target Date 03/17/20                 Plan - 03/10/20 1415    Clinical Impression Statement Patient reported that she has been doing really well. This session included seated on physioball marching with abdominal set to improve core strength. Also added prone "W" back for postural strengthening. Ended with manual therapy with patient reporting a decrease in symptoms overall following this.    Personal Factors and Comorbidities Age;Comorbidity 3+    Comorbidities Osteoporosis; osteoarthritis; depression    Examination-Activity Limitations Lift;Stand;Locomotion Level;Bend;Sit    Examination-Participation Restrictions Meal Prep;Yard Work;Community Activity;Laundry;Shop    Stability/Clinical Decision Making  Evolving/Moderate complexity    Rehab Potential Good    PT Frequency 2x / week    PT Duration 3 weeks    PT Treatment/Interventions  ADLs/Self Care Home Management;Aquatic Therapy;Cryotherapy;Electrical Stimulation;Moist Heat;Traction;DME Instruction;Gait training;Stair training;Functional mobility training;Therapeutic activities;Therapeutic exercise;Balance training;Neuromuscular re-education;Patient/family education;Manual techniques;Passive range of motion;Dry needling;Taping    PT Next Visit Plan Continue thoracic mobility exercises. Re-evaluation at next [place] session.    PT Home Exercise Plan 01/29/20: Thoracic book openers; 02/11/20: Foam roller lat stretch and self-mobilization; 03/03/20: Clams x15    Consulted and Agree with Plan of Care Patient           Patient will benefit from skilled therapeutic intervention in order to improve the following deficits and impairments:  Increased fascial restricitons, Improper body mechanics, Pain, Decreased mobility, Postural dysfunction, Decreased activity tolerance, Decreased range of motion, Hypomobility  Visit Diagnosis: Pain in thoracic spine  Other symptoms and signs involving the musculoskeletal system     Problem List Patient Active Problem List   Diagnosis Date Noted  . Encounter for colorectal cancer screening 01/09/2020  . Encounter for well woman exam with routine gynecological exam 01/09/2020  . Sebaceous cyst 11/27/2019  . Vitamin D deficiency disease 07/22/2019  . Osteoporosis 07/22/2019  . HLD (hyperlipidemia) 07/22/2019  . Encounter for gynecological examination with Papanicolaou smear of cervix 11/28/2017  . Thickened endometrium 03/16/2016  . PMB (postmenopausal bleeding) 03/11/2016  . Post-menopause on HRT (hormone replacement therapy) 03/11/2016  . Loss of weight 04/15/2013  . Diarrhea 04/15/2013  . Skin cancer 04/15/2013   [name] PT, DPT 2:30 PM, 03/10/20 [place] [address] [place], [place], [address] Phone: [phone]   Fax:  [phone]  Name: Morgan Fields MRN: [id number] Date of Birth: 1951-09-03

## 2020-03-12 ENCOUNTER — Ambulatory Visit (HOSPITAL_COMMUNITY): Payer: Medicare Other | Admitting: Physical Therapy

## 2020-03-12 ENCOUNTER — Other Ambulatory Visit: Payer: Self-pay

## 2020-03-12 ENCOUNTER — Encounter (HOSPITAL_COMMUNITY): Payer: Self-pay | Admitting: Physical Therapy

## 2020-03-12 DIAGNOSIS — M6281 Muscle weakness (generalized): Secondary | ICD-10-CM

## 2020-03-12 DIAGNOSIS — R29898 Other symptoms and signs involving the musculoskeletal system: Secondary | ICD-10-CM

## 2020-03-12 DIAGNOSIS — M545 Low back pain, unspecified: Secondary | ICD-10-CM

## 2020-03-12 DIAGNOSIS — M546 Pain in thoracic spine: Secondary | ICD-10-CM | POA: Diagnosis not present

## 2020-03-12 NOTE — Therapy (Signed)
[place], [place], [address] Phone: [phone]   Fax:  [phone]  Physical Therapy Treatment  Patient Details  Name: Morgan Fields MRN: [id number] Date of Birth: 1951/06/29 Referring Provider (PT): Doree Albee, MD   Encounter Date: 03/12/2020   PT End of Session - 03/12/20 1429    Visit Number 15    Number of Visits 18    Date for PT Re-Evaluation 03/17/20    Authorization Type Primary: Medicare; Secondary: Tricare (No visit limit, no auth required)    Progress Note Due on Visit 20    PT Start Time 1300    PT Stop Time 1355    PT Time Calculation (min) 55 min    Activity Tolerance Patient tolerated treatment well    Behavior During Therapy [facility] for tasks assessed/performed           Past Medical History:  Diagnosis Date  . Complication of anesthesia    takes a long time to wake up from anesthesia  . Depression   . HLD (hyperlipidemia) 07/22/2019  . Osteoporosis 07/22/2019  . PMB (postmenopausal bleeding) 03/11/2016  . Post-menopause on HRT (hormone replacement therapy) 03/11/2016  . Skin cancer    Recent to anterior chest; cervical cancer  . Thickened endometrium 03/16/2016  . Vaginal Pap smear, abnormal   . Vitamin D deficiency disease 07/22/2019    Past Surgical History:  Procedure Laterality Date  . BREAST ENHANCEMENT SURGERY    . CERVICAL BIOPSY     precancerous years ago  . CHOLECYSTECTOMY  2011  . COLONOSCOPY N/A 05/08/2013   Procedure: COLONOSCOPY;  Surgeon: Rogene [place], MD;  [facility];  Service: Endoscopy;  Laterality: N/A;  200  . HYSTEROSCOPY WITH D & C N/A 10/05/2016   Procedure: HYSTEROSCOPY; UTERINE CURETTAGE;  Surgeon: Florian Buff, MD;  Location: [facility];  Service: Gynecology;  Laterality: N/A;  . POLYPECTOMY N/A 10/05/2016   Procedure: REMOVAL OF ENDOMETRIAL POLYP;  Surgeon: Florian Buff, MD;  Location: [facility];  Service: Gynecology;  Laterality: N/A;  .  TUBAL LIGATION      There were no vitals filed for this visit.   Subjective Assessment - 03/12/20 1427    Subjective Patetin says she was doing really well with little to no pain until she swept her floor using a broom this morning. Says she was able to do some work around her house and yard yesterday with only minimal soreness.    Pertinent History Mid-back pain which has been ongoing    Limitations House hold activities    How long can you stand comfortably? 20 minutes (was 15)    Diagnostic tests x-ray: no acute abnormality    Patient Stated Goals To decrease pain    Currently in Pain? Yes    Pain Score 2     Pain Location Scapula    Pain Orientation Right;Posterior    Pain Descriptors / Indicators Sharp    Pain Type Chronic pain    Pain Onset More than a month ago                         Adult Aquatic Therapy - 03/12/20 1433      Treatment   Exercises lat stretch at pool rail 5 x 10", pec stretch at wall 5 x 10" each, 3D thoracic excursions x 10 each, paddle twist x20, single arm DB push down x20 ea,  double arm DB pushdown x2 each, noodle twist x20, noodle twist and chop x20, alternating DB push/ pull x20 , DB horiz shoulder abduction x 20, shoulder cross arm stretch 3 x30" each                        PT Short Term Goals - 02/25/20 0954      PT SHORT TERM GOAL #1   Title Patient will report understanding and regular compliance of HEP to decrease pain and improve overall functional mobility.    Time 3    Period Weeks    Status Achieved    Target Date 02/06/20      PT SHORT TERM GOAL #2   Title Patient will report improvement in her overall subjective complaint of at least 25% to improve QoL.    Baseline 02/25/20: 50% improvement reported    Time 3    Period Weeks    Status Achieved    Target Date 02/06/20             PT Long Term Goals - 02/25/20 0954      PT LONG TERM GOAL #1   Title Patient will report improvement in her overall  subjective complaint of at least 50% to improve QoL.    Baseline 02/25/20: Patient reports 50% improvement since beginning therapy    Time 6    Period Weeks    Status Achieved      PT LONG TERM GOAL #2   Title Patient will demonstrate improvement of at least 10% on FOTO indicating percieved functional mobility and improved activity tolerance.    Baseline 02/25/20: 6% improvement    Time 3    Period Weeks    Status On-going    Target Date 03/17/20      PT LONG TERM GOAL #3   Title Patient will report ability to stand and perform household activities for at least 30 minutes for improved efficiency of completing chores at home.    Baseline 02/25/20: 20 minutes currently    Time 3    Period Weeks    Status On-going    Target Date 03/17/20      PT LONG TERM GOAL #4   Title Patient will report improvement in her overall subjective complaint of at least 75% to improve QoL.    Time 3    Period Weeks    Status New    Target Date 03/17/20                 Plan - 03/12/20 1430    Clinical Impression Statement Patient tolerated session well today and shows continued progress toward therapy goals. Patient showing improved pain free mobility wth thoracic mobilizations and back stretches. Added shoulder abduction stretching today per patient subjective complaint of scapular pain. Patient reports overall reduction in pain post treatment. Patient due for reassess next visit per end of certification date. Will adjust POC accordingly.    Personal Factors and Comorbidities Age;Comorbidity 3+    Comorbidities Osteoporosis; osteoarthritis; depression    Examination-Activity Limitations Lift;Stand;Locomotion Level;Bend;Sit    Examination-Participation Restrictions Meal Prep;Yard Work;Community Activity;Laundry;Shop    Stability/Clinical Decision Making Evolving/Moderate complexity    Rehab Potential Good    PT Frequency 2x / week    PT Duration 3 weeks    PT Treatment/Interventions ADLs/Self Care  Home Management;Aquatic Therapy;Cryotherapy;Electrical Stimulation;Moist Heat;Traction;DME Instruction;Gait training;Stair training;Functional mobility training;Therapeutic activities;Therapeutic exercise;Balance training;Neuromuscular re-education;Patient/family education;Manual techniques;Passive range of motion;Dry needling;Taping    PT  Next Visit Plan Continue thoracic mobility exercises. Re-evaluation at next over-land session.    PT Home Exercise Plan 01/29/20: Thoracic book openers; 02/11/20: Foam roller lat stretch and self-mobilization; 03/03/20: Clams x15    Consulted and Agree with Plan of Care Patient           Patient will benefit from skilled therapeutic intervention in order to improve the following deficits and impairments:  Increased fascial restricitons, Improper body mechanics, Pain, Decreased mobility, Postural dysfunction, Decreased activity tolerance, Decreased range of motion, Hypomobility  Visit Diagnosis: Pain in thoracic spine  Other symptoms and signs involving the musculoskeletal system  Low back pain, unspecified back pain laterality, unspecified chronicity, unspecified whether sciatica present  Muscle weakness (generalized)     Problem List Patient Active Problem List   Diagnosis Date Noted  . Encounter for colorectal cancer screening 01/09/2020  . Encounter for well woman exam with routine gynecological exam 01/09/2020  . Sebaceous cyst 11/27/2019  . Vitamin D deficiency disease 07/22/2019  . Osteoporosis 07/22/2019  . HLD (hyperlipidemia) 07/22/2019  . Encounter for gynecological examination with Papanicolaou smear of cervix 11/28/2017  . Thickened endometrium 03/16/2016  . PMB (postmenopausal bleeding) 03/11/2016  . Post-menopause on HRT (hormone replacement therapy) 03/11/2016  . Loss of weight 04/15/2013  . Diarrhea 04/15/2013  . Skin cancer 04/15/2013   2:37 PM, 03/12/20 Josue Hector PT DPT  Physical Therapist with [facility]  [phone]   [facility] [address] [place], [place], [address] Phone: [phone]   Fax:  [phone]  Name: ALEYDA GINDLESPERGER MRN: [id number] Date of Birth: 06-18-1951

## 2020-03-17 ENCOUNTER — Encounter (HOSPITAL_COMMUNITY): Payer: Self-pay | Admitting: Physical Therapy

## 2020-03-17 ENCOUNTER — Other Ambulatory Visit: Payer: Self-pay

## 2020-03-17 ENCOUNTER — Ambulatory Visit (HOSPITAL_COMMUNITY): Payer: Medicare Other | Admitting: Physical Therapy

## 2020-03-17 DIAGNOSIS — R29898 Other symptoms and signs involving the musculoskeletal system: Secondary | ICD-10-CM

## 2020-03-17 DIAGNOSIS — M546 Pain in thoracic spine: Secondary | ICD-10-CM | POA: Diagnosis not present

## 2020-03-17 NOTE — Therapy (Signed)
[place], [place], [address] Phone: [phone]   Fax:  [phone]  Physical Therapy Treatment / Discharge Summary  Patient Details  Name: Morgan Fields MRN: [id number] Date of Birth: [birth year] Referring Provider (PT): Doree Albee, MD   Encounter Date: 03/17/2020   PHYSICAL THERAPY DISCHARGE SUMMARY  Visits from Start of Care: 16  Current functional level related to goals / functional outcomes: See below   Remaining deficits: See below   Education / Equipment: Updated HEP Plan: Patient agrees to discharge.  Patient goals were met. Patient is being discharged due to meeting the stated rehab goals.  ?????        PT End of Session - 03/17/20 1358    Visit Number 16    Number of Visits 18    Date for PT Re-Evaluation 03/17/20    Authorization Type Primary: Medicare; Secondary: Tricare (No visit limit, no auth required)    Progress Note Due on Visit 20    PT Start Time 1354    PT Stop Time 1423    PT Time Calculation (min) 29 min    Activity Tolerance Patient tolerated treatment well    Behavior During Therapy WFL for tasks assessed/performed           Past Medical History:  Diagnosis Date  . Complication of anesthesia    takes a long time to wake up from anesthesia  . Depression   . HLD (hyperlipidemia) 07/22/2019  . Osteoporosis 07/22/2019  . PMB (postmenopausal bleeding) 03/11/2016  . Post-menopause on HRT (hormone replacement therapy) 03/11/2016  . Skin cancer    Recent to anterior chest; cervical cancer  . Thickened endometrium 03/16/2016  . Vaginal Pap smear, abnormal   . Vitamin D deficiency disease 07/22/2019    Past Surgical History:  Procedure Laterality Date  . BREAST ENHANCEMENT SURGERY    . CERVICAL BIOPSY     precancerous years ago  . CHOLECYSTECTOMY  2011  . COLONOSCOPY N/A 05/08/2013   Procedure: COLONOSCOPY;  Surgeon: Rogene [place], MD;  [facility];  Service: Endoscopy;  Laterality: N/A;  200  . HYSTEROSCOPY WITH D & C N/A 10/05/2016   Procedure: HYSTEROSCOPY; UTERINE CURETTAGE;  Surgeon: Florian Buff, MD;  Location: [facility];  Service: Gynecology;  Laterality: N/A;  . POLYPECTOMY N/A 10/05/2016   Procedure: REMOVAL OF ENDOMETRIAL POLYP;  Surgeon: Florian Buff, MD;  Location: [facility];  Service: Gynecology;  Laterality: N/A;  . TUBAL LIGATION      There were no vitals filed for this visit.   Subjective Assessment - 03/17/20 1357    Subjective Patient reported 2/10 pain currently. Patient reported being 80% better since beginning therapy.    Pertinent History Mid-back pain which has been ongoing    Limitations House hold activities    How long can you stand comfortably? 30 minutes at least    How long can you walk comfortably? 30 minutes at least    Diagnostic tests x-ray: no acute abnormality    Patient Stated Goals To decrease pain    Currently in Pain? Yes    Pain Score 2     Pain Location Scapula    Pain Orientation Right;Posterior    Pain Descriptors / Indicators Sharp    Pain Type Chronic pain    Pain Onset More than a month ago              [facility] PT Assessment -  03/17/20 0001      Assessment   Medical Diagnosis Chronic midline thoracic back pain    Referring Provider (PT) Doree Albee, MD      Prior Function   Level of Independence Independent      Cognition   Overall Cognitive Status Within Functional Limits for tasks assessed      Observation/Other Assessments   Focus on Therapeutic Outcomes (FOTO)  56%   was 37%, then 43%     Posture/Postural Control   Posture/Postural Control Postural limitations    Postural Limitations Decreased lumbar lordosis;Increased thoracic kyphosis;Posterior pelvic tilt;Rounded Shoulders;Forward head      AROM   Thoracic Flexion [facility]    Thoracic Extension 50% limited    Thoracic - Right Side [facility]    Thoracic - Left Side [facility]    Thoracic - Right Rotation [facility]      Thoracic - Left Rotation [facility]                                   PT Short Term Goals - 02/25/20 0954      PT SHORT TERM GOAL #1   Title Patient will report understanding and regular compliance of HEP to decrease pain and improve overall functional mobility.    Time 3    Period Weeks    Status Achieved    Target Date 02/06/20      PT SHORT TERM GOAL #2   Title Patient will report improvement in her overall subjective complaint of at least 25% to improve QoL.    Baseline 02/25/20: 50% improvement reported    Time 3    Period Weeks    Status Achieved    Target Date 02/06/20             PT Long Term Goals - 03/17/20 1359      PT LONG TERM GOAL #1   Title Patient will report improvement in her overall subjective complaint of at least 50% to improve QoL.    Baseline 02/25/20: Patient reports 50% improvement since beginning therapy    Time 6    Period Weeks    Status Achieved      PT LONG TERM GOAL #2   Title Patient will demonstrate improvement of at least 10% on FOTO indicating percieved functional mobility and improved activity tolerance.    Baseline 03/17/20: See objective measures    Time 3    Period Weeks    Status Achieved      PT LONG TERM GOAL #3   Title Patient will report ability to stand and perform household activities for at least 30 minutes for improved efficiency of completing chores at home.    Baseline 03/17/20: patient reported she can do activities for at least 30 minutes    Time 3    Period Weeks    Status Achieved      PT LONG TERM GOAL #4   Title Patient will report improvement in her overall subjective complaint of at least 75% to improve QoL.    Baseline 03/17/20: Patient reports having made 80% improvement    Time 3    Period Weeks    Status Achieved                 Plan - 03/17/20 1426    Clinical Impression Statement Performed re-assessment of patient's progress towards goals. Patient achieved all short term and  long term goals. Patient reported improvement by at least 80% since beginning therapy. Patient reported overall feeling better both physically and mentally since beginning therapy and feeling confident to continue exercises independently. Educated patient on additional HEP including horizontal shoulder abduction with theraband. Patient is being discharged at this time as she has met all of her goals and is pleased with her current functional status.    Personal Factors and Comorbidities Age;Comorbidity 3+    Comorbidities Osteoporosis; osteoarthritis; depression    Examination-Activity Limitations Lift;Stand;Locomotion Level;Bend;Sit    Examination-Participation Restrictions Meal Prep;Yard Work;Community Activity;Laundry;Shop    Stability/Clinical Decision Making Evolving/Moderate complexity    Rehab Potential Good    PT Frequency 2x / week    PT Duration 3 weeks    PT Treatment/Interventions ADLs/Self Care Home Management;Aquatic Therapy;Cryotherapy;Electrical Stimulation;Moist Heat;Traction;DME Instruction;Gait training;Stair training;Functional mobility training;Therapeutic activities;Therapeutic exercise;Balance training;Neuromuscular re-education;Patient/family education;Manual techniques;Passive range of motion;Dry needling;Taping    PT Next Visit Plan Discharged    PT Home Exercise Plan 01/29/20: Thoracic book openers; 02/11/20: Foam roller lat stretch and self-mobilization; 03/03/20: Clams x15; 03/17/20: Horizontal shoulder abduction    Consulted and Agree with Plan of Care Patient           Patient will benefit from skilled therapeutic intervention in order to improve the following deficits and impairments:  Increased fascial restricitons, Improper body mechanics, Pain, Decreased mobility, Postural dysfunction, Decreased activity tolerance, Decreased range of motion, Hypomobility  Visit Diagnosis: Pain in thoracic spine  Other symptoms and signs involving the musculoskeletal  system     Problem List Patient Active Problem List   Diagnosis Date Noted  . Encounter for colorectal cancer screening 01/09/2020  . Encounter for well woman exam with routine gynecological exam 01/09/2020  . Sebaceous cyst 11/27/2019  . Vitamin D deficiency disease 07/22/2019  . Osteoporosis 07/22/2019  . HLD (hyperlipidemia) 07/22/2019  . Encounter for gynecological examination with Papanicolaou smear of cervix 11/28/2017  . Thickened endometrium 03/16/2016  . PMB (postmenopausal bleeding) 03/11/2016  . Post-menopause on HRT (hormone replacement therapy) 03/11/2016  . Loss of weight 04/15/2013  . Diarrhea 04/15/2013  . Skin cancer 04/15/2013     PT, DPT 2:28 PM, 03/17/20 [phone]  [facility] [address] [place], [place], [address] Phone: [phone]   Fax:  [phone]  Name: Odeal K Zeiser MRN: [id number] Date of Birth: 11/08/1950   

## 2020-04-07 ENCOUNTER — Ambulatory Visit (INDEPENDENT_AMBULATORY_CARE_PROVIDER_SITE_OTHER): Payer: Medicare Other | Admitting: Internal Medicine

## 2020-04-28 DIAGNOSIS — L814 Other melanin hyperpigmentation: Secondary | ICD-10-CM | POA: Diagnosis not present

## 2020-04-28 DIAGNOSIS — L570 Actinic keratosis: Secondary | ICD-10-CM | POA: Diagnosis not present

## 2020-04-28 DIAGNOSIS — Z85828 Personal history of other malignant neoplasm of skin: Secondary | ICD-10-CM | POA: Diagnosis not present

## 2020-05-28 ENCOUNTER — Other Ambulatory Visit: Payer: Self-pay

## 2020-05-28 ENCOUNTER — Ambulatory Visit (INDEPENDENT_AMBULATORY_CARE_PROVIDER_SITE_OTHER): Payer: Medicare Other | Admitting: Internal Medicine

## 2020-05-28 ENCOUNTER — Encounter (INDEPENDENT_AMBULATORY_CARE_PROVIDER_SITE_OTHER): Payer: Self-pay | Admitting: Internal Medicine

## 2020-05-28 VITALS — BP 105/80 | HR 82 | Temp 97.5°F | Ht 62.25 in | Wt 127.0 lb

## 2020-05-28 DIAGNOSIS — E2839 Other primary ovarian failure: Secondary | ICD-10-CM | POA: Diagnosis not present

## 2020-05-28 DIAGNOSIS — R5381 Other malaise: Secondary | ICD-10-CM

## 2020-05-28 DIAGNOSIS — R5383 Other fatigue: Secondary | ICD-10-CM

## 2020-05-28 DIAGNOSIS — E782 Mixed hyperlipidemia: Secondary | ICD-10-CM

## 2020-05-28 NOTE — Progress Notes (Signed)
Metrics: Intervention Frequency ACO  Documented Smoking Status Yearly  Screened one or more times in 24 months  Cessation Counseling or  Active cessation medication Past 24 months  Past 24 months   Guideline developer: UpToDate (See UpToDate for funding source) Date Released: 2014       Wellness Office Visit  Subjective:  Patient ID: Morgan Fields, female    DOB: 1950-11-12  Age: 69 y.o. MRN: [id number]  CC: This lady comes in for follow-up of bioidentical hormone therapy, hyperlipidemia, vitamin D deficiency. HPI  More recently, although she is doing overall quite well, she describes episodes of what she thinks is memory loss, she feels that she sometimes forgets directions to places.  However, when corrected, she remembers very easily. She continues to take bioidentical hormone therapy with estradiol, progesterone and testosterone. She is taking vitamin D3 supplementation also. She has a history of hyperlipidemia which has not required statin therapy. She has had episodes of feeling lightheaded or dizzy when she stands up.  She admits that she sometimes is not well-hydrated. Past Medical History:  Diagnosis Date  . Complication of anesthesia    takes a long time to wake up from anesthesia  . Depression   . HLD (hyperlipidemia) 07/22/2019  . Osteoporosis 07/22/2019  . PMB (postmenopausal bleeding) 03/11/2016  . Post-menopause on HRT (hormone replacement therapy) 03/11/2016  . Skin cancer    Recent to anterior chest; cervical cancer  . Thickened endometrium 03/16/2016  . Vaginal Pap smear, abnormal   . Vitamin D deficiency disease 07/22/2019   Past Surgical History:  Procedure Laterality Date  . BREAST ENHANCEMENT SURGERY    . CERVICAL BIOPSY     precancerous years ago  . CHOLECYSTECTOMY  2011  . COLONOSCOPY N/A 05/08/2013   Procedure: COLONOSCOPY;  Surgeon: Rogene [place], MD;  [facility];  Service: Endoscopy;  Laterality: N/A;  200  . HYSTEROSCOPY WITH D & C  N/A 10/05/2016   Procedure: HYSTEROSCOPY; UTERINE CURETTAGE;  Surgeon: Florian Buff, MD;  Location: [facility];  Service: Gynecology;  Laterality: N/A;  . POLYPECTOMY N/A 10/05/2016   Procedure: REMOVAL OF ENDOMETRIAL POLYP;  Surgeon: Florian Buff, MD;  Location: [facility];  Service: Gynecology;  Laterality: N/A;  . TUBAL LIGATION       Family History  Problem Relation Age of Onset  . Dementia Mother   . Hyperlipidemia Mother   . Thyroid disease Mother   . Hypertension Mother   . Alcohol abuse Father        Schizophrenia  . Mental illness Brother        Schizophrenia (2 brothers); schizophrenia and bipolar (1 brother)  . Heart attack Brother   . Heart attack Paternal Grandfather   . Heart disease Paternal Grandfather   . Diabetes Paternal Grandfather   . Polycystic ovary syndrome Daughter   . Other Daughter        knee replacement  . Thyroid disease Daughter        had radiation on thyroid  . Cancer Maternal Grandmother        colon  . Diabetes Maternal Grandmother   . Alzheimer's disease Maternal Grandfather   . Mental illness Paternal Grandmother   . Kidney failure Brother     Social History   Social History Narrative   Married for 49 years.Retired,used to work at [employer].   Social History   Tobacco Use  . Smoking status: Current Every Day Smoker    Packs/day:  0.50    Years: 37.00    Pack years: 18.50    Types: Cigarettes  . Smokeless tobacco: Never Used  . Tobacco comment: smokes 5 cig daily  Substance Use Topics  . Alcohol use: Not Currently    Comment: Quit September 2019    Current Meds  Medication Sig  . Cholecalciferol (VITAMIN D-3) 125 MCG (5000 UT) TABS Take 1.5 tablets by mouth daily. Total of 8000IUs daily  . diclofenac Sodium (VOLTAREN) 1 % GEL Apply 1 g topically 4 (four) times daily.  Marland Kitchen estradiol (ESTRACE) 2 MG tablet Take 1.5 tablets (3 mg total) by mouth daily.  . famotidine (PEPCID) 10 MG tablet Take 10 mg by mouth daily.  .  NONFORMULARY OR COMPOUNDED ITEM Inject 5 mg into the muscle 2 (two) times a week. Testosterone cypionate in olive  oil (25 mg/mL).  Dispense 5 mL vial.  . progesterone (PROMETRIUM) 200 MG capsule Take 2 capsules (400 mg total) by mouth daily.  . vitamin B-12 (CYANOCOBALAMIN) 100 MCG tablet Take 100 mcg by mouth daily.      Depression screen [facility] 2/9 01/09/2020 11/27/2019 10/11/2019 11/28/2017  Decreased Interest 1 0 1 0  Down, Depressed, Hopeless 1 0 1 0  PHQ - 2 Score 2 0 2 0  Altered sleeping 1 - 1 -  Tired, decreased energy 1 - 1 -  Change in appetite 1 - 1 -  Feeling bad or failure about yourself  1 - 1 -  Trouble concentrating 1 - 1 -  Moving slowly or fidgety/restless 1 - 1 -  Suicidal thoughts 0 - 0 -  PHQ-9 Score 8 - 8 -  Difficult doing work/chores Not difficult at all - - -     Objective:   Today's Vitals: BP 105/80 (BP Location: Right Arm, Patient Position: Sitting, Cuff Size: Normal)   Pulse 82   Temp (!) 97.5 F (36.4 C) (Temporal)   Ht 5' 2.25" (1.581 m)   Wt 127 lb (57.6 kg)   SpO2 98%   BMI 23.04 kg/m  Vitals with BMI 05/28/2020 01/09/2020 01/07/2020  Height 5' 2.25" 5' 2.25" 5' 3.5"  Weight 127 lbs 124 lbs 124 lbs 6 oz  BMI 23.05 81.1 57.26  Systolic 203 559 741  Diastolic 80 85 80  Pulse 82 82 79     Physical Exam   She looks systemically well.  Weight is very stable.  Blood pressure is excellent.  She is alert and orientated without any focal neurological signs.    Assessment   1. Mixed hyperlipidemia   2. Primary ovarian failure   3. Malaise and fatigue       Tests ordered Orders Placed This Encounter  Procedures  . Estradiol  . Progesterone  . Testos,Total,Free and SHBG (Female)  . Lipid panel  . CBC  . COMPLETE METABOLIC PANEL WITH GFR     Plan: 1. In terms of her symptoms, I am doing some blood work today but I think they will all be normal. 2. We will check her hormones also today. 3. Further recommendations will depend on blood  results and I will have Judson Roch see her in January for an annual Medicare wellness visit where she can formally assess her memory.  I do not really think that she is having any significant memory problems and is likely to do with stress regarding COVID-19 pandemic.   No orders of the defined types were placed in this encounter.   Doree Albee, MD

## 2020-06-02 LAB — COMPLETE METABOLIC PANEL WITH GFR
AG Ratio: 1.8 (calc) (ref 1.0–2.5)
ALT: 10 U/L (ref 6–29)
AST: 16 U/L (ref 10–35)
Albumin: 4.5 g/dL (ref 3.6–5.1)
Alkaline phosphatase (APISO): 39 U/L (ref 37–153)
BUN: 13 mg/dL (ref 7–25)
CO2: 23 mmol/L (ref 20–32)
Calcium: 9.4 mg/dL (ref 8.6–10.4)
Chloride: 105 mmol/L (ref 98–110)
Creat: 0.93 mg/dL (ref 0.50–0.99)
GFR, Est African American: 74 mL/min/{1.73_m2} (ref >=60)
GFR, Est Non African American: 64 mL/min/{1.73_m2} (ref >=60)
Globulin: 2.5 g/dL (calc) (ref 1.9–3.7)
Glucose, Bld: 88 mg/dL (ref 65–99)
Potassium: 4.3 mmol/L (ref 3.5–5.3)
Sodium: 137 mmol/L (ref 135–146)
Total Bilirubin: 0.4 mg/dL (ref 0.2–1.2)
Total Protein: 7 g/dL (ref 6.1–8.1)

## 2020-06-02 LAB — CBC
HCT: 44.6 % (ref 35.0–45.0)
Hemoglobin: 14.7 g/dL (ref 11.7–15.5)
MCH: 29.6 pg (ref 27.0–33.0)
MCHC: 33 g/dL (ref 32.0–36.0)
MCV: 89.9 fL (ref 80.0–100.0)
MPV: 9.4 fL (ref 7.5–12.5)
Platelets: 368 10*3/uL (ref 140–400)
RBC: 4.96 10*6/uL (ref 3.80–5.10)
RDW: 12.2 % (ref 11.0–15.0)
WBC: 9 10*3/uL (ref 3.8–10.8)

## 2020-06-02 LAB — PROGESTERONE: Progesterone: 38.2 ng/mL

## 2020-06-02 LAB — TESTOS,TOTAL,FREE AND SHBG (FEMALE)
Free Testosterone: 6.8 pg/mL — ABNORMAL HIGH (ref 0.1–6.4)
Sex Hormone Binding: 224 nmol/L — ABNORMAL HIGH (ref 14–73)
Testosterone, Total, LC-MS-MS: 190 ng/dL — ABNORMAL HIGH (ref 2–45)

## 2020-06-02 LAB — LIPID PANEL
Cholesterol: 218 mg/dL — ABNORMAL HIGH (ref <200)
HDL: 59 mg/dL (ref >=50)
LDL Cholesterol (Calc): 134 mg/dL (calc) — ABNORMAL HIGH
Non-HDL Cholesterol (Calc): 159 mg/dL (calc) — ABNORMAL HIGH (ref <130)
Total CHOL/HDL Ratio: 3.7 (calc) (ref <5.0)
Triglycerides: 134 mg/dL (ref <150)

## 2020-06-02 LAB — ESTRADIOL: Estradiol: 90 pg/mL

## 2020-06-08 ENCOUNTER — Encounter (INDEPENDENT_AMBULATORY_CARE_PROVIDER_SITE_OTHER): Payer: Self-pay | Admitting: Internal Medicine

## 2020-06-09 ENCOUNTER — Other Ambulatory Visit (INDEPENDENT_AMBULATORY_CARE_PROVIDER_SITE_OTHER): Payer: Self-pay | Admitting: Internal Medicine

## 2020-06-09 MED ORDER — PROGESTERONE 200 MG PO CAPS
400.0000 mg | ORAL_CAPSULE | Freq: Every day | ORAL | 1 refills | Status: DC
Start: 2020-06-09 — End: 2020-12-01

## 2020-06-25 ENCOUNTER — Ambulatory Visit (INDEPENDENT_AMBULATORY_CARE_PROVIDER_SITE_OTHER): Payer: Medicare Other

## 2020-06-25 ENCOUNTER — Other Ambulatory Visit: Payer: Self-pay

## 2020-06-25 DIAGNOSIS — Z23 Encounter for immunization: Secondary | ICD-10-CM

## 2020-06-25 NOTE — Progress Notes (Signed)
Pt high dose given to the left arm. Pt tolerated well; no complaints.

## 2020-06-30 ENCOUNTER — Other Ambulatory Visit: Payer: Self-pay

## 2020-06-30 ENCOUNTER — Ambulatory Visit (INDEPENDENT_AMBULATORY_CARE_PROVIDER_SITE_OTHER): Payer: Medicare Other | Admitting: Nurse Practitioner

## 2020-06-30 ENCOUNTER — Encounter (INDEPENDENT_AMBULATORY_CARE_PROVIDER_SITE_OTHER): Payer: Self-pay | Admitting: Nurse Practitioner

## 2020-06-30 VITALS — BP 114/84 | HR 77 | Temp 97.7°F | Ht 62.25 in | Wt 126.4 lb

## 2020-06-30 DIAGNOSIS — F331 Major depressive disorder, recurrent, moderate: Secondary | ICD-10-CM | POA: Diagnosis not present

## 2020-06-30 DIAGNOSIS — H6691 Otitis media, unspecified, right ear: Secondary | ICD-10-CM | POA: Diagnosis not present

## 2020-06-30 MED ORDER — ESCITALOPRAM OXALATE 10 MG PO TABS: 10.0000 mg | ORAL_TABLET | Freq: Every day | ORAL | 0 refills | Status: DC

## 2020-06-30 MED ORDER — AMOXICILLIN-POT CLAVULANATE 875-125 MG PO TABS: 1.0000 | ORAL_TABLET | Freq: Two times a day (BID) | ORAL | 0 refills | Status: DC

## 2020-06-30 NOTE — Progress Notes (Signed)
Subjective:  Patient ID: Morgan Fields, female    DOB: 1951-08-29  Age: 69 y.o. MRN: [id number]  CC:  Chief Complaint  Patient presents with  . Ear Pain    Right ear pain and some left ear pain, bad head cold a month ago, inner ear pain and worse when sleeping  . Depression      HPI  This patient arrives today for an acute visit for the above.  Tells me that approximately 1 month ago she was experiencing an upper respiratory and or sinus infection.  She was tested for COVID-19 and was found to be negative.  She tells me since then she has been having some ear pain that seems to wax and wane.  She also has a history of TMJ and feels that she is having a flare of this right now.  She is being evaluated and treated by her dentist for this.  About 3 days ago the pain in her right ear started to worsen and she has been trying to treated at home with heating pad as well as taking an over-the-counter sinus and allergy medication.  She tells me that these have helped the pain mildly but she want to be evaluated today for possible ear infection.  She denies any fever.  She does tell me she is been experiencing recurrence of her depression.  She does have a significant history for major depressive disorder in the past.  She does have a family history of bipolar disease, but tells me she is never been diagnosed with bipolar herself.  She has been on Prozac and another antidepressant in the past (she cannot recall the name), and denies any manic episodes with taking these medications.  She denies any suicidal ideation today.  She tells me about 2 weeks ago she was unable to get out of bed for approximately 3 days due to her depression.  Past Medical History:  Diagnosis Date  . Complication of anesthesia    takes a long time to wake up from anesthesia  . Depression   . HLD (hyperlipidemia) 07/22/2019  . Osteoporosis 07/22/2019  . PMB (postmenopausal bleeding) 03/11/2016  . Post-menopause on  HRT (hormone replacement therapy) 03/11/2016  . Skin cancer    Recent to anterior chest; cervical cancer  . Thickened endometrium 03/16/2016  . Vaginal Pap smear, abnormal   . Vitamin D deficiency disease 07/22/2019      Family History  Problem Relation Age of Onset  . Dementia Mother   . Hyperlipidemia Mother   . Thyroid disease Mother   . Hypertension Mother   . Alcohol abuse Father        Schizophrenia  . Mental illness Brother        Schizophrenia (2 brothers); schizophrenia and bipolar (1 brother)  . Heart attack Brother   . Heart attack Paternal Grandfather   . Heart disease Paternal Grandfather   . Diabetes Paternal Grandfather   . Polycystic ovary syndrome Daughter   . Other Daughter        knee replacement  . Thyroid disease Daughter        had radiation on thyroid  . Cancer Maternal Grandmother        colon  . Diabetes Maternal Grandmother   . Alzheimer's disease Maternal Grandfather   . Mental illness Paternal Grandmother   . Kidney failure Brother     Social History   Social History Narrative   Married for 49 years.Retired,used to work  at [employer].   Social History   Tobacco Use  . Smoking status: Current Every Day Smoker    Packs/day: 0.50    Years: 37.00    Pack years: 18.50    Types: Cigarettes  . Smokeless tobacco: Never Used  . Tobacco comment: smokes 5 cig daily  Substance Use Topics  . Alcohol use: Not Currently    Comment: Quit September 2019     Current Meds  Medication Sig  . Cholecalciferol (VITAMIN D-3) 125 MCG (5000 UT) TABS Take 1.5 tablets by mouth daily. Total of 8000IUs daily  . diclofenac Sodium (VOLTAREN) 1 % GEL Apply 1 g topically 4 (four) times daily.  Marland Kitchen estradiol (ESTRACE) 2 MG tablet Take 1.5 tablets (3 mg total) by mouth daily.  . famotidine (PEPCID) 10 MG tablet Take 10 mg by mouth daily.  . NONFORMULARY OR COMPOUNDED ITEM Inject 5 mg into the muscle 2 (two) times a week. Testosterone cypionate in olive   oil (25 mg/mL).  Dispense 5 mL vial.  . progesterone (PROMETRIUM) 200 MG capsule Take 2 capsules (400 mg total) by mouth daily.  . vitamin B-12 (CYANOCOBALAMIN) 100 MCG tablet Take 100 mcg by mouth daily.    ROS:  Review of Systems  Constitutional: Positive for malaise/fatigue. Negative for fever.  HENT: Positive for congestion and ear pain. Negative for ear discharge and sore throat.   Respiratory: Negative for shortness of breath.   Cardiovascular: Negative for chest pain.     Objective:   Today's Vitals: BP 114/84   Pulse 77   Temp 97.7 F (36.5 C) (Temporal)   Ht 5' 2.25" (1.581 m)   Wt 126 lb 6.4 oz (57.3 kg)   SpO2 96%   BMI 22.93 kg/m  Vitals with BMI 06/30/2020 05/28/2020 01/09/2020  Height 5' 2.25" 5' 2.25" 5' 2.25"  Weight 126 lbs 6 oz 127 lbs 124 lbs  BMI 22.94 06.30 16.0  Systolic 109 323 557  Diastolic 84 80 85  Pulse 77 82 82     Physical Exam Vitals reviewed.  Constitutional:      General: She is not in acute distress.    Appearance: Normal appearance.  HENT:     Head: Normocephalic and atraumatic.     Right Ear: Hearing and ear canal normal. There is impacted cerumen.     Left Ear: Hearing, tympanic membrane and ear canal normal.  Neck:     Vascular: No carotid bruit.  Cardiovascular:     Rate and Rhythm: Normal rate and regular rhythm.     Pulses: Normal pulses.     Heart sounds: Normal heart sounds.  Pulmonary:     Effort: Pulmonary effort is normal.     Breath sounds: Normal breath sounds.  Skin:    General: Skin is warm and dry.  Neurological:     General: No focal deficit present.     Mental Status: She is alert and oriented to person, place, and time.  Psychiatric:        Attention and Perception: Attention normal.        Mood and Affect: Mood normal.        Speech: Speech normal.        Thought Content: Thought content normal.        Judgment: Judgment normal.       PHQ9 SCORE ONLY 06/30/2020 01/09/2020 11/27/2019  PHQ-9 Total  Score 15 8 0      Assessment and Plan   1. Right  otitis media, unspecified otitis media type   2. Moderate episode of recurrent major depressive disorder ([place])      Plan: 1.  Unable to visualize right tympanic membrane.  However due to recent history of upper respiratory infection and ear pain will empirically treat her with antibiotics.  No history of antibiotic in the past, so will prescribe course of Augmentin.  She was told to let me know if she has any negative side effects from starting this medication. 2.  We will initiate her on Lexapro 10 mg daily.  She was warned if she experiences any suicidal ideation to call this office right away.  She was told if she feels she is a danger to herself at any point to call 911.   Tests ordered No orders of the defined types were placed in this encounter.     Meds ordered this encounter  Medications  . amoxicillin-clavulanate (AUGMENTIN) 875-125 MG tablet    Sig: Take 1 tablet by mouth 2 (two) times daily.    Dispense:  10 tablet    Refill:  0    Order Specific Question:   Supervising Provider    Answer:   Hurshel Party C [[id number]]  . escitalopram (LEXAPRO) 10 MG tablet    Sig: Take 1 tablet (10 mg total) by mouth daily.    Dispense:  90 tablet    Refill:  0    Order Specific Question:   Supervising Provider    Answer:   Doree Albee [[id number]]    Patient to follow-up in 6 weeks to evaluate tolerance to Lexapro, or sooner as needed.  Ailene Ards, NP

## 2020-07-24 DIAGNOSIS — Z23 Encounter for immunization: Secondary | ICD-10-CM | POA: Diagnosis not present

## 2020-08-18 ENCOUNTER — Encounter (INDEPENDENT_AMBULATORY_CARE_PROVIDER_SITE_OTHER): Payer: Self-pay | Admitting: Nurse Practitioner

## 2020-08-18 ENCOUNTER — Other Ambulatory Visit: Payer: Self-pay

## 2020-08-18 ENCOUNTER — Ambulatory Visit (INDEPENDENT_AMBULATORY_CARE_PROVIDER_SITE_OTHER): Payer: Medicare Other | Admitting: Nurse Practitioner

## 2020-08-18 VITALS — BP 126/74 | HR 52 | Temp 97.2°F | Resp 18 | Ht 60.0 in | Wt 124.0 lb

## 2020-08-18 DIAGNOSIS — F331 Major depressive disorder, recurrent, moderate: Secondary | ICD-10-CM

## 2020-08-18 MED ORDER — HYDROXYZINE HCL 25 MG PO TABS: 25.0000 mg | ORAL_TABLET | Freq: Three times a day (TID) | ORAL | 1 refills | Status: DC | PRN

## 2020-08-18 NOTE — Progress Notes (Signed)
Subjective:  Patient ID: Morgan Fields, female    DOB: 1951/04/22  Age: 69 y.o. MRN: [id number]  CC:  Chief Complaint  Patient presents with  . Depression      HPI  This patient arrives today for the above.  Last PHQ-9 score was 15 back in October.  At that visit we discussed starting her on Lexapro 10 mg daily and she has never taken this medicine.  She tells me she has not had any suicidal ideation and overall she feels that her mood is improving.  She has had a couple of episodes where she has felt sad and been very tearful.  She is also still experiencing early morning awakening, and feels that she does have anxiety intermittently.  She denies any suicidal ideation.  Past Medical History:  Diagnosis Date  . Complication of anesthesia    takes a long time to wake up from anesthesia  . Depression   . HLD (Morgan) 07/22/2019  . Osteoporosis 07/22/2019  . PMB (postmenopausal bleeding) 03/11/2016  . Post-menopause on HRT (hormone replacement therapy) 03/11/2016  . Skin Morgan    Recent to anterior chest; cervical Morgan  . Thickened endometrium 03/16/2016  . Vaginal Pap smear, abnormal   . Vitamin D deficiency disease 07/22/2019      Family History  Problem Relation Age of Onset  . Morgan Fields   . Morgan Fields   . Morgan Fields   . Morgan Fields   . Morgan Fields        Morgan  . Morgan Fields        Morgan (2 brothers); Morgan and bipolar (1 Fields)  . Heart attack Fields   . Heart attack Paternal Fields   . Heart disease Paternal Fields   . Diabetes Paternal Fields   . Morgan Fields   . Other Fields        knee replacement  . Morgan Fields        had radiation on Morgan  . Morgan Fields        colon  . Diabetes Maternal Fields   . Morgan Fields   . Morgan Fields     . Morgan Fields     Social History   Social History Narrative   Married for 49 years.Retired,used to work at [employer].   Social History   Tobacco Use  . Smoking status: Current Every Day Smoker    Packs/day: 0.50    Years: 37.00    Pack years: 18.50    Types: Cigarettes  . Smokeless tobacco: Never Used  . Tobacco comment: smokes 5 cig daily  Substance Use Topics  . Morgan use: Not Currently    Comment: Quit September 2019     Current Meds  Medication Sig  . Cholecalciferol (VITAMIN D-3) 125 MCG (5000 UT) TABS Take 1.5 tablets by mouth daily. Total of 8000IUs daily  . diclofenac Sodium (VOLTAREN) 1 % GEL Apply 1 g topically 4 (four) times daily.  Marland Kitchen escitalopram (LEXAPRO) 10 MG tablet Take 1 tablet (10 mg total) by mouth daily.  Marland Kitchen estradiol (ESTRACE) 2 MG tablet Take 1.5 tablets (3 mg total) by mouth daily.  . famotidine (PEPCID) 10 MG tablet Take 10 mg by mouth daily.  Marland Kitchen L-Lysine 500 MG TABS Take 1 tablet by mouth in the morning and at bedtime.  . NONFORMULARY OR COMPOUNDED ITEM Inject 5 mg into the muscle  2 (two) times a week. Testosterone cypionate in olive  oil (25 mg/mL).  Dispense 5 mL vial.  . progesterone (PROMETRIUM) 200 MG capsule Take 2 capsules (400 mg total) by mouth daily.  . vitamin B-12 (CYANOCOBALAMIN) 100 MCG tablet Take 100 mcg by mouth daily.    ROS:  Review of Systems  Gastrointestinal: Positive for nausea. Negative for vomiting.  Psychiatric/Behavioral: Positive for depression. Negative for suicidal ideas. The patient is nervous/anxious and has insomnia.      Objective:   Today's Vitals: BP 126/74 (BP Location: Left Arm) Comment: 136/80 right arm  Pulse (!) 52   Temp (!) 97.2 F (36.2 C) (Temporal)   Resp 18   Ht 5' (1.524 m)   Wt 124 lb (56.2 kg)   SpO2 92%   BMI 24.22 kg/m  Vitals with BMI 08/18/2020 06/30/2020 05/28/2020  Height 5\' 0"  5' 2.25" 5' 2.25"  Weight 124 lbs 126 lbs 6 oz 127 lbs  BMI 24.22 10.93 23.55   Systolic 732 202 542  Diastolic 74 84 80  Pulse 52 77 82     Physical Exam Vitals reviewed.  Constitutional:      General: She is not in acute distress.    Appearance: Normal appearance.  HENT:     Head: Normocephalic and atraumatic.  Neck:     Vascular: No carotid bruit.  Cardiovascular:     Rate and Rhythm: Normal rate and regular rhythm.     Pulses: Normal pulses.     Heart sounds: Normal heart sounds.  Pulmonary:     Effort: Pulmonary effort is normal.     Breath sounds: Normal breath sounds.  Skin:    General: Skin is warm and dry.  Neurological:     General: No focal deficit present.     Morgan Status: She is alert and oriented to person, place, and time.  Psychiatric:        Mood and Affect: Mood normal.        Behavior: Behavior normal.        Judgment: Judgment normal.       PHQ9 SCORE ONLY 08/18/2020 06/30/2020 01/09/2020  PHQ-9 Total Score 12 15 8       Assessment and Plan   1. Moderate episode of recurrent major depressive disorder ([name])      Plan: 1.  She will continue on the Lexapro 10 mg daily for now.  PHQ-9 is mildly improved compared to last visit.  We will add hydroxyzine that she can take as needed for anxiety.  She was told that she should not take this medicine if she has to drive or operate any heavy machinery.  She tells me she understands.   Tests ordered No orders of the defined types were placed in this encounter.     Meds ordered this encounter  Medications  . hydrOXYzine (ATARAX/VISTARIL) 25 MG tablet    Sig: Take 1 tablet (25 mg total) by mouth 3 (three) times daily as needed for anxiety.    Dispense:  30 tablet    Refill:  1    Order Specific Question:   Supervising Provider    Answer:   Doree Albee [[id number]]    Patient to follow-up in 1 month or sooner as needed.  Ailene Ards, NP

## 2020-08-24 ENCOUNTER — Telehealth (INDEPENDENT_AMBULATORY_CARE_PROVIDER_SITE_OTHER): Payer: Self-pay

## 2020-08-24 NOTE — Telephone Encounter (Signed)
Patient called and left a detailed voice message and stated that she needs a refill of the following medication:  Compounded Testosterone cypionate in olive oil (25mg /mL) Last filled 06/13/2019, 1 each with 3 refills  Patient stated that [facility] in [place] is no longer filling this medication and she does not know where to request or what to do.  Please advise.

## 2020-08-25 NOTE — Telephone Encounter (Signed)
There is a pharmacy in [place] called [facility] which will fill the prescription.  Asked the patient if she would like me to do this.  The pharmacy can deliver the prescription to her home.  Let me know.

## 2020-08-25 NOTE — Telephone Encounter (Signed)
Called patient and LMOVM to return call  Called the cell number provided and left a detailed VM for patient to call back to discuss message and to please leave a number that I can contact the patient. Cell number in chart belongs to husband.

## 2020-09-01 NOTE — Telephone Encounter (Signed)
Patient called back and I asked her if she wanted to use the pharmacy suggested and patient stated that she will call them and then call us back to let us know if she wants to proceed.

## 2020-09-02 NOTE — Telephone Encounter (Signed)
Patient called and left a detailed voice message that she would like to use the [facility] for her Testosterone prescription.   Please send refill to [facility]. Thank you!

## 2020-09-02 NOTE — Telephone Encounter (Signed)
Okay have just call that into [facility] in [place].  They will call her when it is ready.  The phone number for the pharmacy is: [phone] if she needs to call them.  Thanks.

## 2020-09-02 NOTE — Telephone Encounter (Signed)
Called patient and spoke to her husband and he stated they have already called them and will deliver prescription to her soon.

## 2020-09-21 ENCOUNTER — Telehealth (INDEPENDENT_AMBULATORY_CARE_PROVIDER_SITE_OTHER): Payer: Self-pay

## 2020-09-21 DIAGNOSIS — F331 Major depressive disorder, recurrent, moderate: Secondary | ICD-10-CM

## 2020-09-21 MED ORDER — ESCITALOPRAM OXALATE 10 MG PO TABS: 10.0000 mg | ORAL_TABLET | Freq: Every day | ORAL | 0 refills | Status: DC

## 2020-09-21 NOTE — Telephone Encounter (Signed)
Patient called and left a detailed voice message to let us know that she needs a refill of the following medication sent to [facility]:  escitalopram (LEXAPRO) 10 MG tablet  Last filled 06/30/2020, # 90 with 0 refills  Last OV 07/18/2020  Next OV 10/22/2020

## 2020-10-08 ENCOUNTER — Telehealth (INDEPENDENT_AMBULATORY_CARE_PROVIDER_SITE_OTHER): Payer: Self-pay

## 2020-10-08 ENCOUNTER — Other Ambulatory Visit (INDEPENDENT_AMBULATORY_CARE_PROVIDER_SITE_OTHER): Payer: Self-pay | Admitting: Internal Medicine

## 2020-10-08 DIAGNOSIS — F331 Major depressive disorder, recurrent, moderate: Secondary | ICD-10-CM

## 2020-10-08 MED ORDER — HYDROXYZINE HCL 25 MG PO TABS: 25.0000 mg | ORAL_TABLET | Freq: Three times a day (TID) | ORAL | 1 refills | Status: DC | PRN

## 2020-10-08 NOTE — Telephone Encounter (Signed)
Patient called and left a detailed voice message that she would like a 90 day refill of the following medication sent to [facility]:  hydrOXYzine (ATARAX/VISTARIL) 25 MG tablet Last filled 08/18/2020, # 30 with 1 refill

## 2020-10-22 ENCOUNTER — Encounter (INDEPENDENT_AMBULATORY_CARE_PROVIDER_SITE_OTHER): Payer: Self-pay | Admitting: Nurse Practitioner

## 2020-10-22 ENCOUNTER — Other Ambulatory Visit (INDEPENDENT_AMBULATORY_CARE_PROVIDER_SITE_OTHER): Payer: Self-pay | Admitting: Nurse Practitioner

## 2020-10-22 ENCOUNTER — Telehealth (INDEPENDENT_AMBULATORY_CARE_PROVIDER_SITE_OTHER): Payer: Self-pay | Admitting: Nurse Practitioner

## 2020-10-22 ENCOUNTER — Other Ambulatory Visit: Payer: Self-pay

## 2020-10-22 ENCOUNTER — Ambulatory Visit (INDEPENDENT_AMBULATORY_CARE_PROVIDER_SITE_OTHER): Payer: Medicare Other | Admitting: Nurse Practitioner

## 2020-10-22 VITALS — BP 112/76 | HR 78 | Temp 96.8°F | Ht 62.75 in | Wt 126.4 lb

## 2020-10-22 DIAGNOSIS — Z0000 Encounter for general adult medical examination without abnormal findings: Secondary | ICD-10-CM

## 2020-10-22 DIAGNOSIS — F331 Major depressive disorder, recurrent, moderate: Secondary | ICD-10-CM | POA: Diagnosis not present

## 2020-10-22 DIAGNOSIS — M810 Age-related osteoporosis without current pathological fracture: Secondary | ICD-10-CM | POA: Diagnosis not present

## 2020-10-22 MED ORDER — HYDROXYZINE HCL 25 MG PO TABS
25.0000 mg | ORAL_TABLET | Freq: Every day | ORAL | 1 refills | Status: DC | PRN
Start: 2020-10-22 — End: 2022-02-15

## 2020-10-22 NOTE — Progress Notes (Signed)
Subjective:   Morgan Fields is a 70 y.o. female who presents for Medicare Annual (Subsequent) preventive examination.   She is also here for follow-up regarding her mood. She currently is on Lexapro 10 mg daily and we added hydroxyzine that she can take as needed for anxiety. She tells me that she is been taking hydroxyzine at night and since this has been added her mood is much improved. She tells me infection and feeling the best she has felt in a very long time.  Cardiac Risk Factors include: advanced age (>27men, >79 women);smoking/ tobacco exposure;family history of premature cardiovascular disease     Objective:    Today's Vitals   10/22/20 0923 10/22/20 0937  BP: 112/76   Pulse: 78   Temp: (!) 96.8 F (36 C)   TempSrc: Temporal   SpO2: 98%   Weight: 126 lb 6.4 oz (57.3 kg)   Height: 5' 2.75" (1.594 m)   PainSc:  2    Body mass index is 22.57 kg/m.  Advanced Directives 10/22/2020 01/16/2020 10/11/2019 11/29/2018 10/05/2016 09/30/2016 08/10/2014  Does Patient Have a Medical Advance Directive? Yes Yes Yes Yes Yes Yes No;Yes  Type of Paramedic of [place];Living will [place];Living will Out of facility DNR (pink MOST or yellow form) [place];Living will Living will;Healthcare Power of [place];Living will [place];Living will  Does patient want to make changes to medical advance directive? No - Patient declined No - Patient declined No - Patient declined No - Patient declined - - -  Copy of [place] in Chart? - Yes - validated most recent copy scanned in chart (See row information) - No - copy requested No - copy requested - No - copy requested  Pre-existing out of facility DNR order (yellow form or pink MOST form) - - - - - - -    Current Medications (verified) Outpatient Encounter Medications as of 10/22/2020  Medication Sig  .  Cholecalciferol (VITAMIN D-3) 125 MCG (5000 UT) TABS Take 1.5 tablets by mouth daily. Total of 8000IUs daily  . diclofenac Sodium (VOLTAREN) 1 % GEL Apply 1 g topically 4 (four) times daily.  Marland Kitchen escitalopram (LEXAPRO) 10 MG tablet Take 1 tablet (10 mg total) by mouth daily.  Marland Kitchen estradiol (ESTRACE) 2 MG tablet Take 1.5 tablets (3 mg total) by mouth daily.  . famotidine (PEPCID) 10 MG tablet Take 10 mg by mouth daily.  . hydrOXYzine (ATARAX/VISTARIL) 25 MG tablet Take 1 tablet (25 mg total) by mouth 3 (three) times daily as needed for anxiety.  Marland Kitchen L-Lysine 500 MG TABS Take 1 tablet by mouth in the morning and at bedtime.  . MULTIPLE VITAMIN PO Take 1 capsule by mouth daily.  . NONFORMULARY OR COMPOUNDED ITEM Inject 5 mg into the muscle 2 (two) times a week. Testosterone cypionate in olive  oil (25 mg/mL).  Dispense 5 mL vial.  . progesterone (PROMETRIUM) 200 MG capsule Take 2 capsules (400 mg total) by mouth daily.  . vitamin B-12 (CYANOCOBALAMIN) 100 MCG tablet Take 100 mcg by mouth daily. (Patient not taking: Reported on 10/22/2020)   No facility-administered encounter medications on file as of 10/22/2020.    Allergies (verified) Patient has no known allergies.   History: Past Medical History:  Diagnosis Date  . Complication of anesthesia    takes a long time to wake up from anesthesia  . Depression   . HLD (hyperlipidemia) 07/22/2019  .  Osteoporosis 07/22/2019  . PMB (postmenopausal bleeding) 03/11/2016  . Post-menopause on HRT (hormone replacement therapy) 03/11/2016  . Skin cancer    Recent to anterior chest; cervical cancer  . Thickened endometrium 03/16/2016  . Vaginal Pap smear, abnormal   . Vitamin D deficiency disease 07/22/2019   Past Surgical History:  Procedure Laterality Date  . BREAST ENHANCEMENT SURGERY    . CERVICAL BIOPSY     precancerous years ago  . CHOLECYSTECTOMY  2011  . COLONOSCOPY N/A 05/08/2013   Procedure: COLONOSCOPY;  Surgeon: Rogene Houston, MD;  Location: [facility]  END[facility];  Service: Endoscopy;  Laterality: N/A;  200  . HYSTEROSCOPY WITH D & C N/A 10/05/2016   Procedure: HYSTEROSCOPY; UTERINE CURETTAGE;  Surgeon: Florian Buff, MD;  Location: [facility] ORS;  Service: Gynecology;  Laterality: N/A;  . POLYPECTOMY N/A 10/05/2016   Procedure: REMOVAL OF ENDOMETRIAL POLYP;  Surgeon: Florian Buff, MD;  Location: [facility];  Service: Gynecology;  Laterality: N/A;  . TUBAL LIGATION     Family History  Problem Relation Age of Onset  . Dementia Mother   . Hyperlipidemia Mother   . Thyroid disease Mother   . Hypertension Mother   . Alcohol abuse Father        Schizophrenia  . Mental illness Brother        Schizophrenia (2 brothers); schizophrenia and bipolar (1 brother)  . Heart attack Brother   . Heart attack Paternal Grandfather   . Heart disease Paternal Grandfather   . Diabetes Paternal Grandfather   . Polycystic ovary syndrome Daughter   . Other Daughter        knee replacement  . Thyroid disease Daughter        had radiation on thyroid  . Cancer Maternal Grandmother        colon  . Diabetes Maternal Grandmother   . Alzheimer's disease Maternal Grandfather   . Mental illness Paternal Grandmother   . Kidney failure Brother    Social History   Socioeconomic History  . Marital status: Married    Spouse name: Not on file  . Number of children: Not on file  . Years of education: Not on file  . Highest education level: Not on file  Occupational History  . Not on file  Tobacco Use  . Smoking status: Current Every Day Smoker    Packs/day: 0.50    Years: 37.00    Pack years: 18.50    Types: Cigarettes  . Smokeless tobacco: Never Used  . Tobacco comment: smokes 5 cig daily  Vaping Use  . Vaping Use: Never used  Substance and Sexual Activity  . Alcohol use: Not Currently    Comment: Quit September 2019  . Drug use: No  . Sexual activity: Yes    Birth control/protection: Post-menopausal  Other Topics Concern  . Not on file  Social History  Narrative   Married for 49 years.Retired,used to work at [employer].   Social Determinants of Health   Financial Resource Strain: Low Risk   . Difficulty of Paying Living Expenses: Not hard at all  Food Insecurity: No Food Insecurity  . Worried About Charity fundraiser in the Last Year: Never true  . Ran Out of Food in the Last Year: Never true  Transportation Needs: No Transportation Needs  . Lack of Transportation (Medical): No  . Lack of Transportation (Non-Medical): No  Physical Activity: Sufficiently Active  . Days of Exercise per Week: 5 days  .  Minutes of Exercise per Session: 30 min  Stress: No Stress Concern Present  . Feeling of Stress : Only a little  Social Connections: Socially Integrated  . Frequency of Communication with Friends and Family: Once a week  . Frequency of Social Gatherings with Friends and Family: More than three times a week  . Attends Religious Services: More than 4 times per year  . Active Member of Clubs or Organizations: Yes  . Attends Archivist Meetings: More than 4 times per year  . Marital Status: Married    Tobacco Counseling Ready to quit: Not Answered Counseling given: Yes Comment: smokes 5 cig daily   Clinical Intake:  Pre-visit preparation completed: Yes  Pain : 0-10 Pain Score: 2  (Right shoulder - 3/10) Pain Type: Chronic pain Pain Location: Back (And Right shoulder) Pain Orientation: Right Pain Descriptors / Indicators: Nagging Pain Onset: More than a month ago Pain Frequency: Intermittent Pain Relieving Factors: stretching, deep breathing, squats, tylenbol  Pain Relieving Factors: stretching, deep breathing, squats, tylenbol  BMI - recorded: 22.57 Nutritional Status: BMI of 19-24  Normal Nutritional Risks: None Diabetes: No  How often do you need to have someone help you when you read instructions, pamphlets, or other written materials from your doctor or pharmacy?: 1 - Never What is the last  grade level you completed in school?: some college (1 year)  Diabetic? No  Interpreter Needed?: No  Information entered by :: Jeralyn Ruths, NP-C   Activities of Daily Living In your present state of health, do you have any difficulty performing the following activities: 10/22/2020  Hearing? Y  Comment Right ear  Vision? N  Difficulty concentrating or making decisions? N  Walking or climbing stairs? N  Dressing or bathing? N  Doing errands, shopping? N  Preparing Food and eating ? N  Using the Toilet? N  In the past six months, have you accidently leaked urine? N  Do you have problems with loss of bowel control? N  Managing your Medications? N  Managing your Finances? N  Housekeeping or managing your Housekeeping? N  Some recent data might be hidden    Patient Care Team: Doree Albee, MD as PCP - General (Internal Medicine)  Indicate any recent Medical Services you may have received from other than [facility] providers in the past year (date may be approximate).     Assessment:   This is a routine wellness examination for Morgan Fields.  Hearing/Vision screen No exam data present  Dietary issues and exercise activities discussed: Current Exercise Habits: Home exercise routine, Type of exercise: strength training/weights;stretching;walking, Time (Minutes): 45, Frequency (Times/Week): 3, Weekly Exercise (Minutes/Week): 135, Intensity: Moderate, Exercise limited by: orthopedic condition(s)  Goals    . Quit Smoking      Depression Screen PHQ 2/9 Scores 10/22/2020 08/18/2020 06/30/2020 01/09/2020 11/27/2019 10/11/2019 11/28/2017  PHQ - 2 Score 0 4 4 2  0 2 0  PHQ- 9 Score 0 12 15 8  - 8 -    Fall Risk Fall Risk  10/22/2020 01/09/2020 11/27/2019 10/11/2019  Falls in the past year? 0 0 0 0  Number falls in past yr: - - 0 0  Injury with Fall? - - 0 0  Follow up - - - Education provided;Falls prevention discussed    FALL RISK PREVENTION PERTAINING TO THE HOME:  Any stairs in or around the  home? Yes  If so, are there any without handrails? Yes  Home free of loose throw rugs in walkways, pet beds,  electrical cords, etc? Yes  Adequate lighting in your home to reduce risk of falls? Yes   ASSISTIVE DEVICES UTILIZED TO PREVENT FALLS:  Life alert? No  Use of a cane, walker or w/c? Yes  Grab bars in the bathroom? Yes  Shower chair or bench in shower? Yes  Elevated toilet seat or a handicapped toilet? No   TIMED UP AND GO:  Was the test performed? Yes .  Length of time to ambulate 10 feet: 6 sec.   Gait steady and fast without use of assistive device  Cognitive Function:     6CIT Screen 10/22/2020 10/11/2019  What Year? 0 points 0 points  What month? 0 points 0 points  What time? 0 points 0 points  Count back from 20 0 points 0 points  Months in reverse 0 points 0 points  Repeat phrase 2 points 0 points  Total Score 2 0    Immunizations Immunization History  Administered Date(s) Administered  . Fluad Quad(high Dose 65+) 06/25/2020  . Influenza-Unspecified 06/20/2019  . PFIZER(Purple Top)SARS-COV-2 Vaccination 11/01/2019, 11/24/2019  . Pneumococcal Conjugate-13 07/31/2017  . Pneumococcal Polysaccharide-23 08/09/2018  . Tdap 07/22/2019  . Zoster 06/02/2017    TDAP status: Up to date  Flu Vaccine status: Up to date  Pneumococcal vaccine status: Up to date  Covid-19 vaccine status: Completed vaccines  Qualifies for Shingles Vaccine? No   Zostavax completed No   Shingrix Completed?: Yes  Screening Tests Health Maintenance  Topic Date Due  . COVID-19 Vaccine (3 - Pfizer risk 4-dose series) 12/22/2019  . MAMMOGRAM  10/30/2021  . COLONOSCOPY (Pts 45-33yrs Insurance coverage will need to be confirmed)  05/09/2023  . TETANUS/TDAP  07/21/2029  . INFLUENZA VACCINE  Completed  . DEXA SCAN  Completed  . Hepatitis C Screening  Completed  . PNA vac Low Risk Adult  Completed    Health Maintenance  Health Maintenance Due  Topic Date Due  . COVID-19 Vaccine  (3 - Pfizer risk 4-dose series) 12/22/2019    Colorectal cancer screening: Type of screening: Colonoscopy. Completed 2014. Repeat every 10 years  Mammogram status: Completed 2021. Repeat every year  Bone Density status: Ordered 10/22/2020. Pt provided with contact info and advised to call to schedule appt.  Lung Cancer Screening: (Low Dose CT Chest recommended if Age 24-80 years, 30 pack-year currently smoking OR have quit w/in 15years.) does qualify.   Lung Cancer Screening Referral: Patient would prefer to hold off at this time  Additional Screening:  Hepatitis C Screening: does not qualify; Completed 04/2013  Vision Screening: Recommended annual ophthalmology exams for early detection of glaucoma and other disorders of the eye. Is the patient up to date with their annual eye exam?  Yes  Who is the provider or what is the name of the office in which the patient attends annual eye exams? [facility] If pt is not established with a provider, would they like to be referred to a provider to establish care? No .   Dental Screening: Recommended annual dental exams for proper oral hygiene  Community Resource Referral / Chronic Care Management: CRR required this visit?  No   CCM required this visit?  No      Plan:   Patient's mood is much improved and she will continue taking her Lexapro and hydroxyzine as needed. She is encouraged on me know if things change. Will order DEXA scan today for evaluation of her osteoporosis. She will be due for her mammogram in a few days  and she plans on calling them to get this appointment scheduled.  I have personally reviewed and noted the following in the patient's chart:   . Medical and social history . Use of alcohol, tobacco or illicit drugs  . Current medications and supplements . Functional ability and status . Nutritional status . Physical activity . Advanced directives . List of other physicians . Hospitalizations, surgeries, and ER  visits in previous 12 months . Vitals . Screenings to include cognitive, depression, and falls . Referrals and appointments  In addition, I have reviewed and discussed with patient certain preventive protocols, quality metrics, and best practice recommendations. A written personalized care plan for preventive services as well as general preventive health recommendations were provided to patient.    She will follow-up in approximate 3 months for annual physical exam and then again in 1 year for annual wellness visit.  Ailene Ards, NP   10/22/2020

## 2020-10-22 NOTE — Patient Instructions (Signed)
  Morgan Fields , Thank you for taking time to come for your Medicare Wellness Visit. I appreciate your ongoing commitment to your health goals. Please review the following plan we discussed and let me know if I can assist you in the future.   These are the goals we discussed: Goals    . Quit Smoking       This is a list of the screening recommended for you and due dates:  Health Maintenance  Topic Date Due  . COVID-19 Vaccine (4 - Booster for Pfizer series) 01/17/2021  . Mammogram  10/30/2021  . Colon Cancer Screening  05/09/2023  . Tetanus Vaccine  07/21/2029  . Flu Shot  Completed  . DEXA scan (bone density measurement)  Completed  .  Hepatitis C: One time screening is recommended by Center for Disease Control  (CDC) for  adults born from [birth year] through 1964.   Completed  . Pneumonia vaccines  Completed

## 2020-10-22 NOTE — Telephone Encounter (Signed)
DEXA scan ordered for this patient today. Please make sure this is run through insurance and then subsequently scheduled. Thank you.

## 2020-10-22 NOTE — Telephone Encounter (Signed)
Order was re sent for you to sign. Then it is scheduled.

## 2020-11-03 DIAGNOSIS — Z85828 Personal history of other malignant neoplasm of skin: Secondary | ICD-10-CM | POA: Diagnosis not present

## 2020-11-03 DIAGNOSIS — L814 Other melanin hyperpigmentation: Secondary | ICD-10-CM | POA: Diagnosis not present

## 2020-11-03 DIAGNOSIS — L570 Actinic keratosis: Secondary | ICD-10-CM | POA: Diagnosis not present

## 2020-11-04 ENCOUNTER — Other Ambulatory Visit (HOSPITAL_COMMUNITY): Payer: Medicare Other

## 2020-11-25 ENCOUNTER — Other Ambulatory Visit (INDEPENDENT_AMBULATORY_CARE_PROVIDER_SITE_OTHER): Payer: Self-pay | Admitting: Internal Medicine

## 2020-11-25 ENCOUNTER — Telehealth (INDEPENDENT_AMBULATORY_CARE_PROVIDER_SITE_OTHER): Payer: Self-pay

## 2020-11-25 MED ORDER — ESTRADIOL 2 MG PO TABS: 3.0000 mg | ORAL_TABLET | Freq: Every day | ORAL | 1 refills | Status: DC

## 2020-11-25 NOTE — Telephone Encounter (Signed)
Patient called and left a detailed voice message that she needs a refill of the following medication sent to [facility]:  estradiol (ESTRACE) 2 MG tablet  Last OV 03/09/2020, # 135 with 1 refill  Last OV 08/18/2020  Next OV 01/21/2021

## 2020-11-30 ENCOUNTER — Encounter (INDEPENDENT_AMBULATORY_CARE_PROVIDER_SITE_OTHER): Payer: Self-pay | Admitting: Nurse Practitioner

## 2020-12-01 ENCOUNTER — Other Ambulatory Visit (INDEPENDENT_AMBULATORY_CARE_PROVIDER_SITE_OTHER): Payer: Self-pay | Admitting: Internal Medicine

## 2020-12-02 ENCOUNTER — Other Ambulatory Visit (INDEPENDENT_AMBULATORY_CARE_PROVIDER_SITE_OTHER): Payer: Self-pay | Admitting: Internal Medicine

## 2020-12-02 DIAGNOSIS — F331 Major depressive disorder, recurrent, moderate: Secondary | ICD-10-CM

## 2020-12-02 MED ORDER — ESCITALOPRAM OXALATE 10 MG PO TABS: 10.0000 mg | ORAL_TABLET | Freq: Every day | ORAL | 0 refills | Status: DC

## 2020-12-07 ENCOUNTER — Telehealth (INDEPENDENT_AMBULATORY_CARE_PROVIDER_SITE_OTHER): Payer: Medicare Other | Admitting: Nurse Practitioner

## 2020-12-07 ENCOUNTER — Ambulatory Visit (HOSPITAL_COMMUNITY)
Admission: RE | Admit: 2020-12-07 | Discharge: 2020-12-07 | Disposition: A | Discharge status: Home / Self Care | Payer: Medicare Other | Source: Ambulatory Visit | Attending: Nurse Practitioner | Admitting: Nurse Practitioner

## 2020-12-07 ENCOUNTER — Encounter (INDEPENDENT_AMBULATORY_CARE_PROVIDER_SITE_OTHER): Payer: Self-pay | Admitting: Nurse Practitioner

## 2020-12-07 DIAGNOSIS — M810 Age-related osteoporosis without current pathological fracture: Secondary | ICD-10-CM | POA: Insufficient documentation

## 2020-12-07 DIAGNOSIS — Z780 Asymptomatic menopausal state: Secondary | ICD-10-CM | POA: Diagnosis not present

## 2020-12-07 DIAGNOSIS — F331 Major depressive disorder, recurrent, moderate: Secondary | ICD-10-CM

## 2020-12-07 DIAGNOSIS — M8589 Other specified disorders of bone density and structure, multiple sites: Secondary | ICD-10-CM | POA: Diagnosis not present

## 2020-12-07 MED ORDER — ESCITALOPRAM OXALATE 20 MG PO TABS: 20.0000 mg | ORAL_TABLET | Freq: Every day | ORAL | 0 refills | Status: DC

## 2020-12-07 NOTE — Progress Notes (Signed)
An audio/visual tele-health visit was conducted today. I connected with  Morgan Fields on 12/07/20 utilizing audio/visual technology and verified that I am speaking with the correct person using two identifiers. The patient was located at their home, and I was located at the office of [facility] during the encounter. I discussed the limitations of evaluation and management by telemedicine. The patient expressed understanding and agreed to proceed.    Subjective:  Patient ID: Morgan Fields, female    DOB: 31-Jul-1951  Age: 70 y.o. MRN: [id number]  CC:  Chief Complaint  Patient presents with  . Depression      HPI  This patient arrives today for virtual visit for the above.  She has a history of anxiety and depression.  She is been on Lexapro 10 mg daily, but tells me over the last month or so she feels that her mood has worsened and she has been experiencing more sadness and been on the verge of crying.  She also experiences fatigue during the day and is having vivid violent dreams.  She continues to take hydroxyzine as needed at night which helps her with sleep and anxiety so she does not have early morning awakening.  She denies any suicidal ideation currently.  Past Medical History:  Diagnosis Date  . Complication of anesthesia    takes a long time to wake up from anesthesia  . Depression   . HLD (hyperlipidemia) 07/22/2019  . Osteoporosis 07/22/2019  . PMB (postmenopausal bleeding) 03/11/2016  . Post-menopause on HRT (hormone replacement therapy) 03/11/2016  . Skin cancer    Recent to anterior chest; cervical cancer  . Thickened endometrium 03/16/2016  . Vaginal Pap smear, abnormal   . Vitamin D deficiency disease 07/22/2019      Family History  Problem Relation Age of Onset  . Dementia Mother   . Hyperlipidemia Mother   . Thyroid disease Mother   . Hypertension Mother   . Alcohol abuse Father        Schizophrenia  . Mental illness Brother         Schizophrenia (2 brothers); schizophrenia and bipolar (1 brother)  . Heart attack Brother   . Heart attack Paternal Grandfather   . Heart disease Paternal Grandfather   . Diabetes Paternal Grandfather   . Polycystic ovary syndrome Daughter   . Other Daughter        knee replacement  . Thyroid disease Daughter        had radiation on thyroid  . Cancer Maternal Grandmother        colon  . Diabetes Maternal Grandmother   . Alzheimer's disease Maternal Grandfather   . Mental illness Paternal Grandmother   . Kidney failure Brother     Social History   Social History Narrative   Married for 49 years.Retired,used to work at [employer].   Social History   Tobacco Use  . Smoking status: Current Every Day Smoker    Packs/day: 0.50    Years: 37.00    Pack years: 18.50    Types: Cigarettes  . Smokeless tobacco: Never Used  . Tobacco comment: smokes 5 cig daily  Substance Use Topics  . Alcohol use: Not Currently    Comment: Quit September 2019     Current Meds  Medication Sig  . Cholecalciferol (VITAMIN D-3) 125 MCG (5000 UT) TABS Take 1.5 tablets by mouth daily. Total of 8000IUs daily  . diclofenac Sodium (VOLTAREN) 1 % GEL Apply 1 g  topically 4 (four) times daily.  Marland Kitchen estradiol (ESTRACE) 2 MG tablet Take 1.5 tablets (3 mg total) by mouth daily.  . hydrOXYzine (ATARAX/VISTARIL) 25 MG tablet Take 1 tablet (25 mg total) by mouth daily as needed for anxiety.  Marland Kitchen L-Lysine 500 MG TABS Take 1 tablet by mouth in the morning and at bedtime.  . MULTIPLE VITAMIN PO Take 1 capsule by mouth daily.  . NONFORMULARY OR COMPOUNDED ITEM Inject 5 mg into the muscle 2 (two) times a week. Testosterone cypionate in olive  oil (25 mg/mL).  Dispense 5 mL vial.  . progesterone (PROMETRIUM) 200 MG capsule TAKE 2 CAPSULES DAILY  . [DISCONTINUED] escitalopram (LEXAPRO) 10 MG tablet Take 1 tablet (10 mg total) by mouth daily.    ROS:  Review of Systems  Psychiatric/Behavioral: Positive for  depression. Negative for suicidal ideas.     Objective:   Today's Vitals: There were no vitals taken for this visit. Vitals with BMI 12/07/2020 10/22/2020 08/18/2020  Height - 5' 2.75" 5\' 0"   Weight - 126 lbs 6 oz 124 lbs  BMI - 95.74 73.40  Systolic (No Data) 370 964  Diastolic (No Data) 76 74  Pulse - 78 52     Physical Exam Comprehensive physical exam not completed today as office visit was conducted remotely.  Patient appeared well over the video.  Patient was alert and oriented, and appeared to have appropriate judgment.       Assessment and Plan   1. Moderate episode of recurrent major depressive disorder ([place])      Plan: 1.  We will increase her Lexapro to 20 mg daily.  She continue take hydroxyzine as needed.  She will follow-up as scheduled in approximately 6 weeks and was told that there is a risk of increased suicidal ideation with medication adjustments and that if this were to occur she needs to notify us immediately.  She tells me she understands.   Tests ordered No orders of the defined types were placed in this encounter.     Meds ordered this encounter  Medications  . escitalopram (LEXAPRO) 20 MG tablet    Sig: Take 1 tablet (20 mg total) by mouth daily.    Dispense:  90 tablet    Refill:  0    Order Specific Question:   Supervising Provider    Answer:   Doree Albee [[id number]]    Patient to follow-up in 6 weeks or sooner as needed.  Ailene Ards, NP

## 2021-01-06 ENCOUNTER — Ambulatory Visit (INDEPENDENT_AMBULATORY_CARE_PROVIDER_SITE_OTHER): Payer: Medicare Other | Admitting: Internal Medicine

## 2021-01-06 ENCOUNTER — Encounter (INDEPENDENT_AMBULATORY_CARE_PROVIDER_SITE_OTHER): Payer: Self-pay | Admitting: Internal Medicine

## 2021-01-06 ENCOUNTER — Other Ambulatory Visit: Payer: Self-pay

## 2021-01-06 VITALS — BP 112/74 | HR 95 | Temp 97.7°F | Ht 60.0 in | Wt 127.0 lb

## 2021-01-06 DIAGNOSIS — R5383 Other fatigue: Secondary | ICD-10-CM

## 2021-01-06 DIAGNOSIS — R5381 Other malaise: Secondary | ICD-10-CM

## 2021-01-06 DIAGNOSIS — M545 Low back pain, unspecified: Secondary | ICD-10-CM

## 2021-01-06 DIAGNOSIS — G8929 Other chronic pain: Secondary | ICD-10-CM | POA: Diagnosis not present

## 2021-01-06 LAB — DHEA-SULFATE: DHEA-SO4: 29 ug/dL (ref 9–118)

## 2021-01-06 NOTE — Progress Notes (Signed)
Metrics: Intervention Frequency ACO  Documented Smoking Status Yearly  Screened one or more times in 24 months  Cessation Counseling or  Active cessation medication Past 24 months  Past 24 months   Guideline developer: UpToDate (See UpToDate for funding source) Date Released: 2014       Wellness Office Visit  Subjective:  Patient ID: Morgan Fields, female    DOB: June 20, 1951  Age: 70 y.o. MRN: [id number]  CC: Low back pain. HPI  The patient comes in with about a 3 to 61-month history of the above symptoms which have been very debilitating.  She rates the pain at least 10 out of 10 on many days and she typically notices the pain is present for most of the day now days.  Thankfully, the pain does not radiate down her legs and she is able to walk although it is somewhat painful to do that. She did have MRI of the lumbar spine in 2019 and also a recent bone density scan, both of which confirmed the presence of significant degenerative joint disease.  I suspect she has worsening osteoarthritis. She has not been exercising on a consistent basis. Past Medical History:  Diagnosis Date  . Complication of anesthesia    takes a long time to wake up from anesthesia  . Depression   . HLD (hyperlipidemia) 07/22/2019  . Osteoporosis 07/22/2019  . PMB (postmenopausal bleeding) 03/11/2016  . Post-menopause on HRT (hormone replacement therapy) 03/11/2016  . Skin cancer    Recent to anterior chest; cervical cancer  . Thickened endometrium 03/16/2016  . Vaginal Pap smear, abnormal   . Vitamin D deficiency disease 07/22/2019   Past Surgical History:  Procedure Laterality Date  . BREAST ENHANCEMENT SURGERY    . CERVICAL BIOPSY     precancerous years ago  . CHOLECYSTECTOMY  2011  . COLONOSCOPY N/A 05/08/2013   Procedure: COLONOSCOPY;  Surgeon: Rogene Houston, MD;  Location: [facility];  Service: Endoscopy;  Laterality: N/A;  200  . HYSTEROSCOPY WITH D & C N/A 10/05/2016   Procedure: HYSTEROSCOPY;  UTERINE CURETTAGE;  Surgeon: Florian Buff, MD;  Location: [facility];  Service: Gynecology;  Laterality: N/A;  . POLYPECTOMY N/A 10/05/2016   Procedure: REMOVAL OF ENDOMETRIAL POLYP;  Surgeon: Florian Buff, MD;  Location: [facility];  Service: Gynecology;  Laterality: N/A;  . TUBAL LIGATION       Family History  Problem Relation Age of Onset  . Dementia Mother   . Hyperlipidemia Mother   . Thyroid disease Mother   . Hypertension Mother   . Alcohol abuse Father        Schizophrenia  . Mental illness Brother        Schizophrenia (2 brothers); schizophrenia and bipolar (1 brother)  . Heart attack Brother   . Heart attack Paternal Grandfather   . Heart disease Paternal Grandfather   . Diabetes Paternal Grandfather   . Polycystic ovary syndrome Daughter   . Other Daughter        knee replacement  . Thyroid disease Daughter        had radiation on thyroid  . Cancer Maternal Grandmother        colon  . Diabetes Maternal Grandmother   . Alzheimer's disease Maternal Grandfather   . Mental illness Paternal Grandmother   . Kidney failure Brother     Social History   Social History Narrative   Married for 49 years.Retired,used to work at [employer].   Social  History   Tobacco Use  . Smoking status: Current Every Day Smoker    Packs/day: 0.50    Years: 37.00    Pack years: 18.50    Types: Cigarettes  . Smokeless tobacco: Never Used  . Tobacco comment: smokes 5 cig daily  Substance Use Topics  . Alcohol use: Not Currently    Comment: Quit September 2019    Current Meds  Medication Sig  . Cholecalciferol (VITAMIN D-3) 125 MCG (5000 UT) TABS Take 1.5 tablets by mouth daily. Total of 8000IUs daily  . diclofenac Sodium (VOLTAREN) 1 % GEL Apply 1 g topically 4 (four) times daily.  Marland Kitchen escitalopram (LEXAPRO) 20 MG tablet Take 1 tablet (20 mg total) by mouth daily.  Marland Kitchen estradiol (ESTRACE) 2 MG tablet Take 1.5 tablets (3 mg total) by mouth daily.  . hydrOXYzine  (ATARAX/VISTARIL) 25 MG tablet Take 1 tablet (25 mg total) by mouth daily as needed for anxiety.  Marland Kitchen L-Lysine 500 MG TABS Take 1 tablet by mouth in the morning and at bedtime.  . MULTIPLE VITAMIN PO Take 1 capsule by mouth daily.  . NONFORMULARY OR COMPOUNDED ITEM Inject 5 mg into the muscle 2 (two) times a week. Testosterone cypionate in olive  oil (25 mg/mL).  Dispense 5 mL vial.  . progesterone (PROMETRIUM) 200 MG capsule TAKE 2 CAPSULES DAILY     Flowsheet Row Office Visit from 01/06/2021 in [facility]  PHQ-9 Total Score 0      Objective:   Today's Vitals: BP 112/74   Pulse 95   Temp 97.7 F (36.5 C) (Temporal)   Ht 5' (1.524 m)   Wt 127 lb (57.6 kg)   SpO2 99%   BMI 24.80 kg/m  Vitals with BMI 01/06/2021 12/07/2020 10/22/2020  Height 5\' 0"  - 5' 2.75"  Weight 127 lbs - 126 lbs 6 oz  BMI 70.2 - 63.78  Systolic 588 (No Data) 502  Diastolic 74 (No Data) 76  Pulse 95 - 78     Physical Exam  She does not appear to be in any pain at the present time.  Her gait is normal.  I did not formally examine her back today because of her previous scans that confirmed the presence of osteoarthritis.     Assessment   1. Chronic bilateral low back pain without sciatica   2. Malaise and fatigue       Tests ordered Orders Placed This Encounter  Procedures  . DG Lumbar Spine Complete  . DHEA-sulfate     Plan: 1. I will order a lumbar spine x-ray to make sure there are no other issues apart from the arthritis. 2. We will check DHEA levels as this may actually help her therapeutically. 3. I have spoken about back exercises which will help strengthen her back muscles. 4. Follow-up as scheduled in May.   No orders of the defined types were placed in this encounter.   Doree Albee, MD

## 2021-01-07 ENCOUNTER — Ambulatory Visit (HOSPITAL_COMMUNITY)
Admission: RE | Admit: 2021-01-07 | Discharge: 2021-01-07 | Disposition: A | Discharge status: Home / Self Care | Payer: Medicare Other | Source: Ambulatory Visit | Attending: Internal Medicine | Admitting: Internal Medicine

## 2021-01-07 DIAGNOSIS — M545 Low back pain, unspecified: Secondary | ICD-10-CM | POA: Insufficient documentation

## 2021-01-07 DIAGNOSIS — G8929 Other chronic pain: Secondary | ICD-10-CM | POA: Diagnosis not present

## 2021-01-21 ENCOUNTER — Other Ambulatory Visit: Payer: Self-pay

## 2021-01-21 ENCOUNTER — Ambulatory Visit (INDEPENDENT_AMBULATORY_CARE_PROVIDER_SITE_OTHER): Payer: Medicare Other | Admitting: Internal Medicine

## 2021-01-21 ENCOUNTER — Encounter (INDEPENDENT_AMBULATORY_CARE_PROVIDER_SITE_OTHER): Payer: Self-pay | Admitting: Internal Medicine

## 2021-01-21 VITALS — BP 120/80 | HR 78 | Temp 97.5°F | Resp 18 | Ht 62.0 in | Wt 126.4 lb

## 2021-01-21 DIAGNOSIS — R5383 Other fatigue: Secondary | ICD-10-CM | POA: Diagnosis not present

## 2021-01-21 DIAGNOSIS — R5381 Other malaise: Secondary | ICD-10-CM

## 2021-01-21 DIAGNOSIS — E782 Mixed hyperlipidemia: Secondary | ICD-10-CM

## 2021-01-21 DIAGNOSIS — E2839 Other primary ovarian failure: Secondary | ICD-10-CM

## 2021-01-21 LAB — LIPID PANEL
Cholesterol: 239 mg/dL — ABNORMAL HIGH (ref <200)
HDL: 65 mg/dL (ref >=50)
LDL Cholesterol (Calc): 155 mg/dL (calc) — ABNORMAL HIGH
Non-HDL Cholesterol (Calc): 174 mg/dL (calc) — ABNORMAL HIGH (ref <130)
Total CHOL/HDL Ratio: 3.7 (calc) (ref <5.0)
Triglycerides: 86 mg/dL (ref <150)

## 2021-01-21 LAB — DHEA-SULFATE: DHEA-SO4: 414 ug/dL — ABNORMAL HIGH (ref 9–118)

## 2021-01-21 NOTE — Progress Notes (Signed)
Metrics: Intervention Frequency ACO  Documented Smoking Status Yearly  Screened one or more times in 24 months  Cessation Counseling or  Active cessation medication Past 24 months  Past 24 months   Guideline developer: UpToDate (See UpToDate for funding source) Date Released: 2014       Wellness Office Visit  Subjective:  Patient ID: Morgan Fields, female    DOB: 1951-07-03  Age: 70 y.o. MRN: [id number]  CC: This delightful lady comes in for follow-up of bioidentical hormone therapy, her low back pain, hyperlipidemia. HPI  The low back pain is significantly improved with a combination of DHEA, back stretching exercises that we discussed previously. She continues on estradiol 3 mg daily, progesterone 400 mg at night and testosterone injections 5 mg twice a week. She is now eating virtually a plant-based diet. She is energized, increased libido and sense of wellbeing. Past Medical History:  Diagnosis Date  . Complication of anesthesia    takes a long time to wake up from anesthesia  . Depression   . HLD (hyperlipidemia) 07/22/2019  . Osteoporosis 07/22/2019  . PMB (postmenopausal bleeding) 03/11/2016  . Post-menopause on HRT (hormone replacement therapy) 03/11/2016  . Skin cancer    Recent to anterior chest; cervical cancer  . Thickened endometrium 03/16/2016  . Vaginal Pap smear, abnormal   . Vitamin D deficiency disease 07/22/2019   Past Surgical History:  Procedure Laterality Date  . BREAST ENHANCEMENT SURGERY    . CERVICAL BIOPSY     precancerous years ago  . CHOLECYSTECTOMY  2011  . COLONOSCOPY N/A 05/08/2013   Procedure: COLONOSCOPY;  Surgeon: Rogene Houston, MD;  [facility];  Service: Endoscopy;  Laterality: N/A;  200  . HYSTEROSCOPY WITH D & C N/A 10/05/2016   Procedure: HYSTEROSCOPY; UTERINE CURETTAGE;  Surgeon: Florian Buff, MD;  Location: [facility];  Service: Gynecology;  Laterality: N/A;  . POLYPECTOMY N/A 10/05/2016   Procedure: REMOVAL OF ENDOMETRIAL  POLYP;  Surgeon: Florian Buff, MD;  Location: [facility];  Service: Gynecology;  Laterality: N/A;  . TUBAL LIGATION       Family History  Problem Relation Age of Onset  . Dementia Mother   . Hyperlipidemia Mother   . Thyroid disease Mother   . Hypertension Mother   . Alcohol abuse Father        Schizophrenia  . Mental illness Brother        Schizophrenia (2 brothers); schizophrenia and bipolar (1 brother)  . Heart attack Brother   . Heart attack Paternal Grandfather   . Heart disease Paternal Grandfather   . Diabetes Paternal Grandfather   . Polycystic ovary syndrome Daughter   . Other Daughter        knee replacement  . Thyroid disease Daughter        had radiation on thyroid  . Cancer Maternal Grandmother        colon  . Diabetes Maternal Grandmother   . Alzheimer's disease Maternal Grandfather   . Mental illness Paternal Grandmother   . Kidney failure Brother     Social History   Social History Narrative   Married for 49 years.Retired,used to work at [employer].   Social History   Tobacco Use  . Smoking status: Current Every Day Smoker    Packs/day: 0.50    Years: 37.00    Pack years: 18.50    Types: Cigarettes  . Smokeless tobacco: Never Used  . Tobacco comment: smokes 5 cig daily  Substance Use Topics  . Alcohol use: Not Currently    Comment: Quit September 2019    Current Meds  Medication Sig  . Cholecalciferol (VITAMIN D-3) 125 MCG (5000 UT) TABS Take 1.5 tablets by mouth daily. Total of 8000IUs daily  . DHEA 25 MG tablet Take 25 mg by mouth daily.  . diclofenac Sodium (VOLTAREN) 1 % GEL Apply 1 g topically 4 (four) times daily.  Marland Kitchen escitalopram (LEXAPRO) 20 MG tablet Take 1 tablet (20 mg total) by mouth daily.  Marland Kitchen estradiol (ESTRACE) 2 MG tablet Take 1.5 tablets (3 mg total) by mouth daily.  . hydrOXYzine (ATARAX/VISTARIL) 25 MG tablet Take 1 tablet (25 mg total) by mouth daily as needed for anxiety.  Marland Kitchen L-Lysine 500 MG TABS Take 1 tablet by  mouth in the morning and at bedtime.  . MULTIPLE VITAMIN PO Take 1 capsule by mouth daily.  . NONFORMULARY OR COMPOUNDED ITEM Inject 5 mg into the muscle 2 (two) times a week. Testosterone cypionate in olive  oil (25 mg/mL).  Dispense 5 mL vial.  . progesterone (PROMETRIUM) 200 MG capsule TAKE 2 CAPSULES DAILY  . vitamin B-12 (CYANOCOBALAMIN) 100 MCG tablet Take 100 mcg by mouth daily.     [place] Office Visit from 01/06/2021 in [facility]  PHQ-9 Total Score 0      Objective:   Today's Vitals: BP 120/80 (BP Location: Right Arm, Patient Position: Sitting, Cuff Size: Small)   Pulse 78   Temp (!) 97.5 F (36.4 C) (Temporal)   Resp 18   Ht 5\' 2"  (1.575 m)   Wt 126 lb 6.4 oz (57.3 kg)   SpO2 99%   BMI 23.12 kg/m  Vitals with BMI 01/21/2021 01/06/2021 12/07/2020  Height 5\' 2"  5\' 0"  -  Weight 126 lbs 6 oz 127 lbs -  BMI 09.32 35.5 -  Systolic 732 202 (No Data)  Diastolic 80 74 (No Data)  Pulse 78 95 -     Physical Exam   She looks systemically well.  Weight is stable.  Blood pressure is excellent.    Assessment   1. Mixed hyperlipidemia   2. Primary ovarian failure   3. Malaise and fatigue       Tests ordered Orders Placed This Encounter  Procedures  . Lipid panel  . DHEA-sulfate     Plan: 1. She will continue with bioidentical hormone therapy as before.  No need to adjust doses at the present time. 2. Check a lipid panel. 3. Continue with DHEA 15 mg daily and I will check levels today. 4. Follow-up in 6 months.   No orders of the defined types were placed in this encounter.   Doree Albee, MD

## 2021-04-05 ENCOUNTER — Other Ambulatory Visit (INDEPENDENT_AMBULATORY_CARE_PROVIDER_SITE_OTHER): Payer: Self-pay | Admitting: Internal Medicine

## 2021-04-05 ENCOUNTER — Telehealth (INDEPENDENT_AMBULATORY_CARE_PROVIDER_SITE_OTHER): Payer: Self-pay

## 2021-04-05 MED ORDER — NONFORMULARY OR COMPOUNDED ITEM: 5.0000 mg | 1 refills | Status: DC

## 2021-04-05 NOTE — Telephone Encounter (Signed)
Patient called and left a detailed voice message letting us know that she needs a refill of the following medication because she let the refill expire and wants sent to [facility]:   NONFORMULARY OR COMPOUNDED ITEM  Last filled testosterone on 06/13/2019, 5 mL with 3 refills  Last OV 01/21/2021  Next OV 07/29/2021, 11/11/2020

## 2021-04-16 DIAGNOSIS — Z23 Encounter for immunization: Secondary | ICD-10-CM | POA: Diagnosis not present

## 2021-04-29 ENCOUNTER — Other Ambulatory Visit (INDEPENDENT_AMBULATORY_CARE_PROVIDER_SITE_OTHER): Payer: Self-pay | Admitting: Nurse Practitioner

## 2021-04-29 DIAGNOSIS — F331 Major depressive disorder, recurrent, moderate: Secondary | ICD-10-CM

## 2021-05-03 ENCOUNTER — Encounter (INDEPENDENT_AMBULATORY_CARE_PROVIDER_SITE_OTHER): Payer: Self-pay | Admitting: Nurse Practitioner

## 2021-05-03 ENCOUNTER — Telehealth: Payer: Self-pay

## 2021-05-03 NOTE — Telephone Encounter (Signed)
Lmom to call back regarding new appt with Dr Posey Pronto scheduled via My Chart.

## 2021-05-05 ENCOUNTER — Other Ambulatory Visit (INDEPENDENT_AMBULATORY_CARE_PROVIDER_SITE_OTHER): Payer: Self-pay | Admitting: Nurse Practitioner

## 2021-05-05 DIAGNOSIS — F331 Major depressive disorder, recurrent, moderate: Secondary | ICD-10-CM

## 2021-05-05 DIAGNOSIS — L570 Actinic keratosis: Secondary | ICD-10-CM | POA: Diagnosis not present

## 2021-05-05 MED ORDER — ESCITALOPRAM OXALATE 20 MG PO TABS
20.0000 mg | ORAL_TABLET | Freq: Every day | ORAL | 1 refills | Status: DC
Start: 2021-05-05 — End: 2022-02-15

## 2021-06-02 ENCOUNTER — Ambulatory Visit: Payer: Medicare Other | Admitting: Internal Medicine

## 2021-06-09 DIAGNOSIS — M1990 Unspecified osteoarthritis, unspecified site: Secondary | ICD-10-CM | POA: Diagnosis not present

## 2021-06-09 DIAGNOSIS — F17210 Nicotine dependence, cigarettes, uncomplicated: Secondary | ICD-10-CM | POA: Diagnosis not present

## 2021-06-09 DIAGNOSIS — F329 Major depressive disorder, single episode, unspecified: Secondary | ICD-10-CM | POA: Diagnosis not present

## 2021-06-09 DIAGNOSIS — M810 Age-related osteoporosis without current pathological fracture: Secondary | ICD-10-CM | POA: Diagnosis not present

## 2021-06-09 DIAGNOSIS — K219 Gastro-esophageal reflux disease without esophagitis: Secondary | ICD-10-CM | POA: Diagnosis not present

## 2021-06-09 DIAGNOSIS — F419 Anxiety disorder, unspecified: Secondary | ICD-10-CM | POA: Diagnosis not present

## 2021-06-09 DIAGNOSIS — H9191 Unspecified hearing loss, right ear: Secondary | ICD-10-CM | POA: Diagnosis not present

## 2021-06-09 DIAGNOSIS — E785 Hyperlipidemia, unspecified: Secondary | ICD-10-CM | POA: Diagnosis not present

## 2021-06-09 DIAGNOSIS — E2839 Other primary ovarian failure: Secondary | ICD-10-CM | POA: Diagnosis not present

## 2021-06-09 DIAGNOSIS — M545 Low back pain, unspecified: Secondary | ICD-10-CM | POA: Diagnosis not present

## 2021-06-17 DIAGNOSIS — R059 Cough, unspecified: Secondary | ICD-10-CM | POA: Diagnosis not present

## 2021-06-17 DIAGNOSIS — U071 COVID-19: Secondary | ICD-10-CM | POA: Diagnosis not present

## 2021-06-17 DIAGNOSIS — R5381 Other malaise: Secondary | ICD-10-CM | POA: Diagnosis not present

## 2021-06-24 DIAGNOSIS — Z20822 Contact with and (suspected) exposure to covid-19: Secondary | ICD-10-CM | POA: Diagnosis not present

## 2021-07-26 DIAGNOSIS — Z23 Encounter for immunization: Secondary | ICD-10-CM | POA: Diagnosis not present

## 2021-07-27 DIAGNOSIS — E785 Hyperlipidemia, unspecified: Secondary | ICD-10-CM | POA: Diagnosis not present

## 2021-07-29 ENCOUNTER — Ambulatory Visit (INDEPENDENT_AMBULATORY_CARE_PROVIDER_SITE_OTHER): Payer: Medicare Other | Admitting: Internal Medicine

## 2021-08-03 DIAGNOSIS — E785 Hyperlipidemia, unspecified: Secondary | ICD-10-CM | POA: Diagnosis not present

## 2021-08-03 DIAGNOSIS — I10 Essential (primary) hypertension: Secondary | ICD-10-CM | POA: Diagnosis not present

## 2021-08-03 DIAGNOSIS — K219 Gastro-esophageal reflux disease without esophagitis: Secondary | ICD-10-CM | POA: Diagnosis not present

## 2021-08-03 DIAGNOSIS — F419 Anxiety disorder, unspecified: Secondary | ICD-10-CM | POA: Diagnosis not present

## 2021-09-01 IMAGING — DX DG THORACIC SPINE 2V
3 series · 3 of 3 positions shown · non-contrast
Comparison: None.

CLINICAL DATA: Chronic thoracic spine pain.

EXAM:
THORACIC SPINE 2 VIEWS

[t-spine ap]
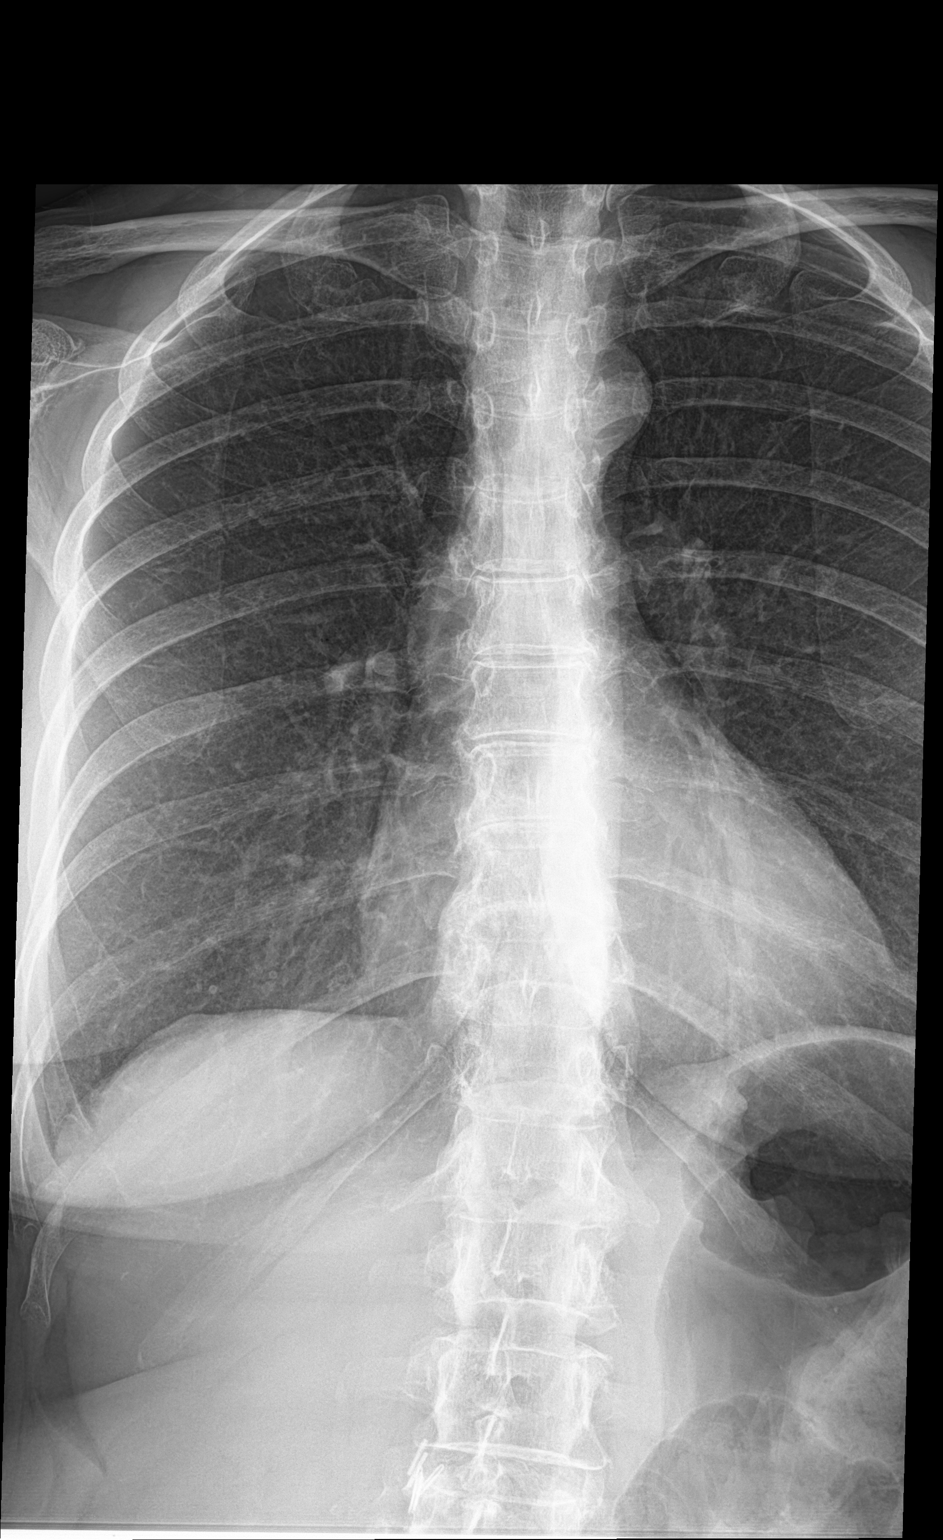

[t-spine lat]
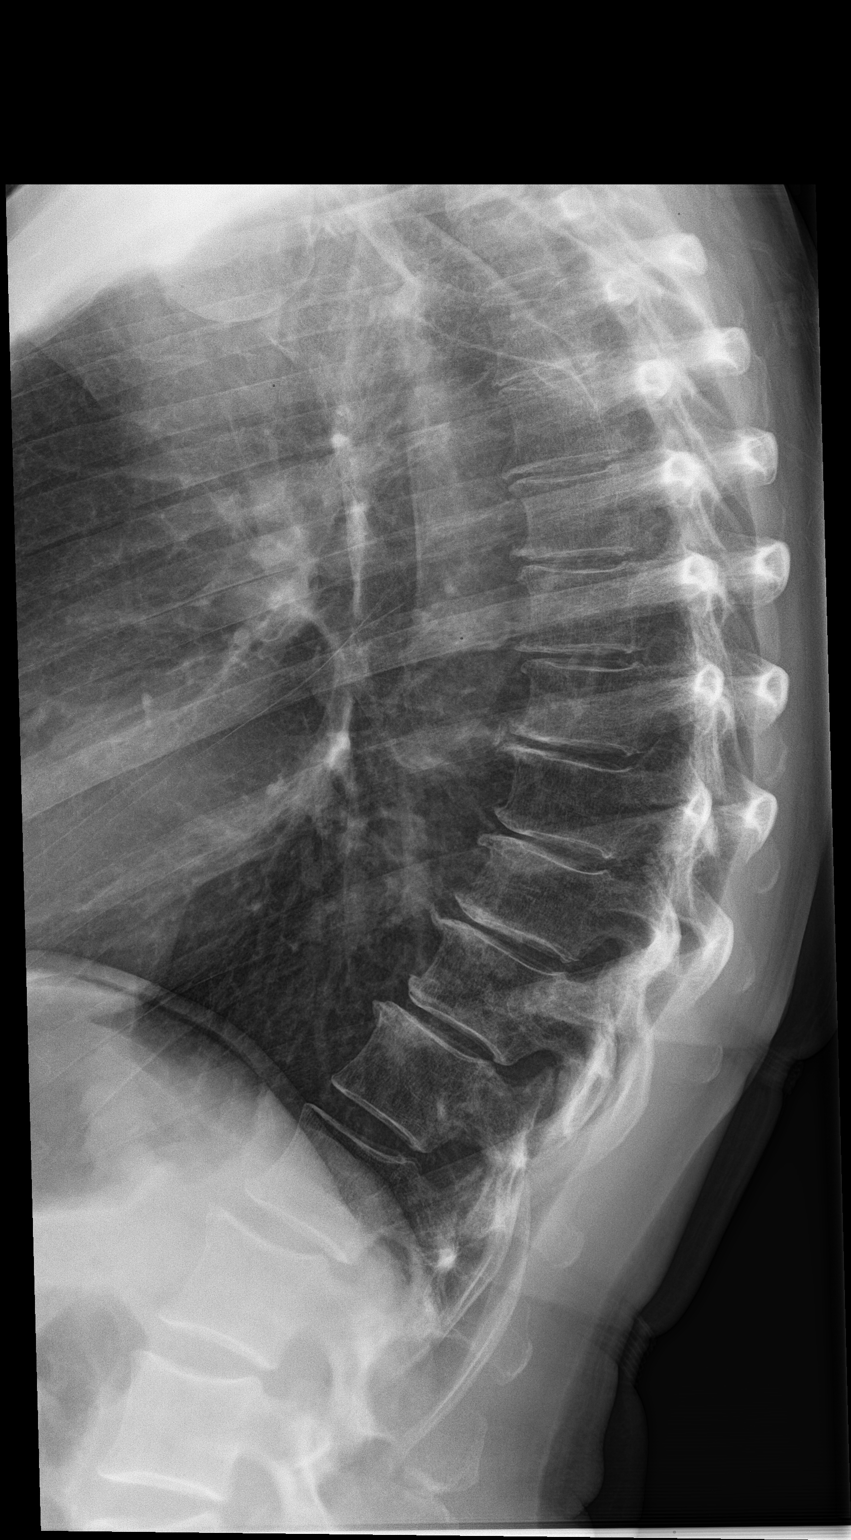

[t-spine swimmers]
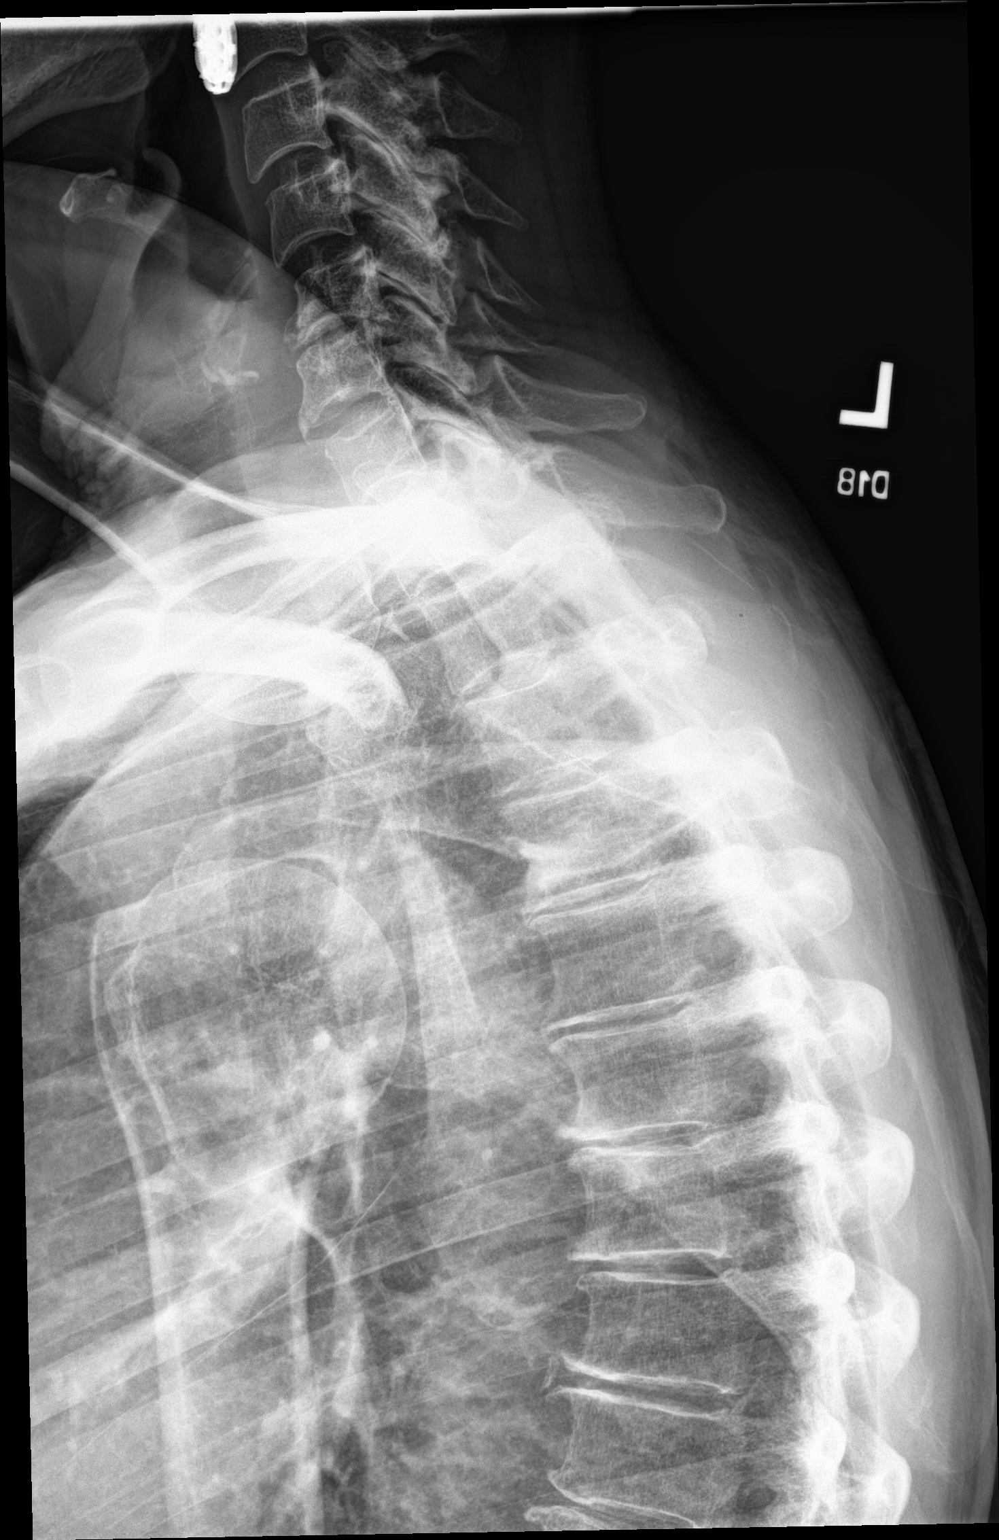

[3 of 3 positions shown; findings below may reference images not displayed]

FINDINGS: No fracture or spondylolisthesis is noted. Mild multilevel
degenerative changes are noted in the mid and lower thoracic spine
with anterior osteophyte formation.
IMPRESSION: Mild multilevel degenerative changes are noted. No acute abnormality
seen in the thoracic spine.

## 2021-09-21 DIAGNOSIS — E785 Hyperlipidemia, unspecified: Secondary | ICD-10-CM | POA: Diagnosis not present

## 2021-11-09 DIAGNOSIS — L570 Actinic keratosis: Secondary | ICD-10-CM | POA: Diagnosis not present

## 2021-11-09 DIAGNOSIS — D485 Neoplasm of uncertain behavior of skin: Secondary | ICD-10-CM | POA: Diagnosis not present

## 2021-11-09 DIAGNOSIS — D3611 Benign neoplasm of peripheral nerves and autonomic nervous system of face, head, and neck: Secondary | ICD-10-CM | POA: Diagnosis not present

## 2021-11-11 ENCOUNTER — Encounter (INDEPENDENT_AMBULATORY_CARE_PROVIDER_SITE_OTHER): Payer: Medicare Other | Admitting: Nurse Practitioner

## 2021-12-28 DIAGNOSIS — F339 Major depressive disorder, recurrent, unspecified: Secondary | ICD-10-CM | POA: Diagnosis not present

## 2021-12-28 DIAGNOSIS — E785 Hyperlipidemia, unspecified: Secondary | ICD-10-CM | POA: Diagnosis not present

## 2021-12-28 DIAGNOSIS — Z1211 Encounter for screening for malignant neoplasm of colon: Secondary | ICD-10-CM | POA: Diagnosis not present

## 2021-12-28 DIAGNOSIS — M810 Age-related osteoporosis without current pathological fracture: Secondary | ICD-10-CM | POA: Diagnosis not present

## 2021-12-28 DIAGNOSIS — I10 Essential (primary) hypertension: Secondary | ICD-10-CM | POA: Diagnosis not present

## 2021-12-28 DIAGNOSIS — M5440 Lumbago with sciatica, unspecified side: Secondary | ICD-10-CM | POA: Diagnosis not present

## 2021-12-28 DIAGNOSIS — Z0001 Encounter for general adult medical examination with abnormal findings: Secondary | ICD-10-CM | POA: Diagnosis not present

## 2022-01-18 DIAGNOSIS — Z131 Encounter for screening for diabetes mellitus: Secondary | ICD-10-CM | POA: Diagnosis not present

## 2022-01-18 DIAGNOSIS — Z1329 Encounter for screening for other suspected endocrine disorder: Secondary | ICD-10-CM | POA: Diagnosis not present

## 2022-01-18 DIAGNOSIS — E785 Hyperlipidemia, unspecified: Secondary | ICD-10-CM | POA: Diagnosis not present

## 2022-01-18 DIAGNOSIS — Z13228 Encounter for screening for other metabolic disorders: Secondary | ICD-10-CM | POA: Diagnosis not present

## 2022-01-18 DIAGNOSIS — I10 Essential (primary) hypertension: Secondary | ICD-10-CM | POA: Diagnosis not present

## 2022-01-18 DIAGNOSIS — R5383 Other fatigue: Secondary | ICD-10-CM | POA: Diagnosis not present

## 2022-01-25 DIAGNOSIS — F419 Anxiety disorder, unspecified: Secondary | ICD-10-CM | POA: Diagnosis not present

## 2022-01-25 DIAGNOSIS — E785 Hyperlipidemia, unspecified: Secondary | ICD-10-CM | POA: Diagnosis not present

## 2022-01-25 DIAGNOSIS — I10 Essential (primary) hypertension: Secondary | ICD-10-CM | POA: Diagnosis not present

## 2022-01-25 DIAGNOSIS — H6123 Impacted cerumen, bilateral: Secondary | ICD-10-CM | POA: Diagnosis not present

## 2022-01-25 DIAGNOSIS — F339 Major depressive disorder, recurrent, unspecified: Secondary | ICD-10-CM | POA: Diagnosis not present

## 2022-02-04 DIAGNOSIS — Z1212 Encounter for screening for malignant neoplasm of rectum: Secondary | ICD-10-CM | POA: Diagnosis not present

## 2022-02-04 DIAGNOSIS — Z1211 Encounter for screening for malignant neoplasm of colon: Secondary | ICD-10-CM | POA: Diagnosis not present

## 2022-02-15 ENCOUNTER — Ambulatory Visit (INDEPENDENT_AMBULATORY_CARE_PROVIDER_SITE_OTHER): Payer: Medicare Other | Admitting: Psychiatry

## 2022-02-15 ENCOUNTER — Encounter: Payer: Self-pay | Admitting: Psychiatry

## 2022-02-15 VITALS — BP 128/84 | HR 91 | Temp 97.8°F | Wt 125.4 lb

## 2022-02-15 DIAGNOSIS — Z79899 Other long term (current) drug therapy: Secondary | ICD-10-CM | POA: Diagnosis not present

## 2022-02-15 DIAGNOSIS — F4310 Post-traumatic stress disorder, unspecified: Secondary | ICD-10-CM | POA: Diagnosis not present

## 2022-02-15 DIAGNOSIS — F17200 Nicotine dependence, unspecified, uncomplicated: Secondary | ICD-10-CM | POA: Diagnosis not present

## 2022-02-15 DIAGNOSIS — F411 Generalized anxiety disorder: Secondary | ICD-10-CM | POA: Diagnosis not present

## 2022-02-15 DIAGNOSIS — F32A Depression, unspecified: Secondary | ICD-10-CM | POA: Insufficient documentation

## 2022-02-15 MED ORDER — DULOXETINE HCL 20 MG PO CPEP: 20.0000 mg | ORAL_CAPSULE | Freq: Two times a day (BID) | ORAL | 1 refills | Status: DC

## 2022-02-15 MED ORDER — PRAZOSIN HCL 1 MG PO CAPS: 1.0000 mg | ORAL_CAPSULE | Freq: Every day | ORAL | 1 refills | Status: DC
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
## 2022-02-15 NOTE — Patient Instructions (Addendum)
For therapy  www.[facility].org  www.[facility]  Please start taking Xanax - once a day for 1 week, then skip once a week the next week and then the week after that just start using as needed 2-3 times a week for severe anxiety attacks.   Prazosin Capsules What is this medication? PRAZOSIN (PRA zoe sin) treats high blood pressure. It works by relaxing blood vessels, which decreases the amount of work the heart has to do. It belongs to a group of medications called alpha blockers. This medicine may be used for other purposes; ask your health care provider or pharmacist if you have questions. COMMON BRAND NAME(S): Minipress What should I tell my care team before I take this medication? They need to know if you have any of the following conditions: Kidney disease An unusual or allergic reaction to prazosin, other medications, foods, dyes, or preservatives Pregnant or trying to get pregnant Breast-feeding How should I use this medication? Take this medication by mouth. Take it as directed on the prescription label at the same time every day. Keep taking it unless your care team tells you to stop. Talk to your care team about the use of this medication in children. Special care may be needed. Overdosage: If you think you have taken too much of this medicine contact a poison control center or emergency room at once. NOTE: This medicine is only for you. Do not share this medicine with others. What if I miss a dose? If you miss a dose, take it as soon as you can. If it is almost time for your next dose, take only that dose. Do not take double or extra doses. What may interact with this medication? This medication may interact with the following: Diuretics Medications for high blood pressure Sildenafil Tadalafil Vardenafil This list may not describe all possible interactions. Give your health care provider a list of all the medicines, herbs, non-prescription drugs, or dietary  supplements you use. Also tell them if you smoke, drink alcohol, or use illegal drugs. Some items may interact with your medicine. What should I watch for while using this medication? Visit your care team for regular checks on your progress. Check your blood pressure regularly. Ask your care team what your blood pressure should be and when you should contact them. Drowsiness and dizziness are more likely to occur after the first dose, after an increase in dose, or during hot weather or exercise. These effects can decrease once your body adjusts to this medication. Do not drive, use machinery, or do anything that needs mental alertness until you know how this medication affects you. Do not stand or sit up quickly, especially if you are an older patient. This reduces the risk of dizzy or fainting spells. Alcohol can make you more drowsy and dizzy. Avoid alcoholic drinks. Do not treat yourself for coughs, colds or allergies without asking your care team for advice. Some ingredients can increase your blood pressure. Your mouth may get dry. Chewing sugarless gum or sucking hard candy, and drinking plenty of water may help. Contact your care team if the problem does not go away or is severe. For males, contact your care team right away if you have an erection that lasts longer than 4 hours or if it becomes painful. This may be a sign of a serious problem and must be treated right away to prevent permanent damage. What side effects may I notice from receiving this medication? Side effects that you should report to your care  team as soon as possible: Allergic reactions--skin rash, itching, hives, swelling of the face, lips, tongue, or throat Low blood pressure--dizziness, feeling faint or lightheaded, blurry vision Prolonged or painful erection Side effects that usually do not require medical attention (report to your care team if they continue or are bothersome): Blurry  vision Drowsiness Fatigue Headache Heart palpitations--rapid, pounding, or irregular heartbeat Nausea Swelling of the ankles, hands, or feet This list may not describe all possible side effects. Call your doctor for medical advice about side effects. You may report side effects to FDA at 1-800-FDA-1088. Where should I keep my medication? Keep out of the reach of children and pets. Store at room temperature between 15 and 30 degrees C (59 and 86 degrees F). Throw away any unused medication after the expiration date. NOTE: This sheet is a summary. It may not cover all possible information. If you have questions about this medicine, talk to your doctor, pharmacist, or health care provider.  2023 Elsevier/Gold Standard (2021-08-16 00:00:00)

## 2022-03-03 ENCOUNTER — Telehealth: Payer: Self-pay

## 2022-03-03 NOTE — Telephone Encounter (Signed)
Medication management - Telephone call with pt, after receiving prescription refill requests from [facility] for 90 day supplies of pt's Duloxetine and Prazosin.  Pt. verified she has enough of these until she returns 03/15/22 but would then like for future orders to be sent to [facility].  Agreed to send Dr. Shea Evans a message about this and patient also agreed to remind the provider of this during appt 03/15/22.

## 2022-03-03 NOTE — Telephone Encounter (Signed)
Got it, thanks!

## 2022-03-07 DIAGNOSIS — Z79899 Other long term (current) drug therapy: Secondary | ICD-10-CM | POA: Diagnosis not present

## 2022-03-07 DIAGNOSIS — F32A Depression, unspecified: Secondary | ICD-10-CM | POA: Diagnosis not present

## 2022-03-07 DIAGNOSIS — F411 Generalized anxiety disorder: Secondary | ICD-10-CM | POA: Diagnosis not present

## 2022-03-08 DIAGNOSIS — H6122 Impacted cerumen, left ear: Secondary | ICD-10-CM | POA: Diagnosis not present

## 2022-03-08 DIAGNOSIS — M1990 Unspecified osteoarthritis, unspecified site: Secondary | ICD-10-CM | POA: Diagnosis not present

## 2022-03-08 DIAGNOSIS — I10 Essential (primary) hypertension: Secondary | ICD-10-CM | POA: Diagnosis not present

## 2022-03-08 DIAGNOSIS — F419 Anxiety disorder, unspecified: Secondary | ICD-10-CM | POA: Diagnosis not present

## 2022-03-08 DIAGNOSIS — Z79899 Other long term (current) drug therapy: Secondary | ICD-10-CM | POA: Diagnosis not present

## 2022-03-08 DIAGNOSIS — E785 Hyperlipidemia, unspecified: Secondary | ICD-10-CM | POA: Diagnosis not present

## 2022-03-08 LAB — TSH: TSH: 1.07 u[IU]/mL (ref 0.450–4.500)

## 2022-03-08 LAB — HEPATIC FUNCTION PANEL
ALT: 12 IU/L (ref 0–32)
AST: 18 IU/L (ref 0–40)
Albumin: 4.6 g/dL (ref 3.8–4.8)
Alkaline Phosphatase: 62 IU/L (ref 44–121)
Bilirubin Total: 0.4 mg/dL (ref 0.0–1.2)
Bilirubin, Direct: 0.1 mg/dL (ref 0.00–0.40)
Total Protein: 6.8 g/dL (ref 6.0–8.5)

## 2022-03-08 LAB — SODIUM: Sodium: 140 mmol/L (ref 134–144)

## 2022-03-15 ENCOUNTER — Ambulatory Visit (INDEPENDENT_AMBULATORY_CARE_PROVIDER_SITE_OTHER): Payer: Medicare Other | Admitting: Psychiatry

## 2022-03-15 ENCOUNTER — Encounter: Payer: Self-pay | Admitting: Psychiatry

## 2022-03-15 VITALS — BP 118/73 | HR 81 | Temp 98.3°F | Wt 124.8 lb

## 2022-03-15 DIAGNOSIS — F17200 Nicotine dependence, unspecified, uncomplicated: Secondary | ICD-10-CM

## 2022-03-15 DIAGNOSIS — F321 Major depressive disorder, single episode, moderate: Secondary | ICD-10-CM | POA: Insufficient documentation

## 2022-03-15 DIAGNOSIS — Z79899 Other long term (current) drug therapy: Secondary | ICD-10-CM | POA: Diagnosis not present

## 2022-03-15 DIAGNOSIS — F330 Major depressive disorder, recurrent, mild: Secondary | ICD-10-CM | POA: Diagnosis not present

## 2022-03-15 DIAGNOSIS — F411 Generalized anxiety disorder: Secondary | ICD-10-CM

## 2022-03-15 DIAGNOSIS — F4310 Post-traumatic stress disorder, unspecified: Secondary | ICD-10-CM | POA: Diagnosis not present

## 2022-03-15 MED ORDER — PROPRANOLOL HCL 10 MG PO TABS: 10.0000 mg | ORAL_TABLET | Freq: Two times a day (BID) | ORAL | 1 refills | Status: DC | PRN

## 2022-03-15 MED ORDER — PRAZOSIN HCL 1 MG PO CAPS: 1.0000 mg | ORAL_CAPSULE | Freq: Every day | ORAL | 0 refills | Status: DC

## 2022-03-15 MED ORDER — DULOXETINE HCL 60 MG PO CPEP: 60.0000 mg | ORAL_CAPSULE | Freq: Every day | ORAL | 0 refills | Status: DC

## 2022-03-15 NOTE — Progress Notes (Signed)
[facility] MD OP Progress Note  03/15/2022 5:57 PM Morgan Fields  MRN:  [id number]  Chief Complaint:  Chief Complaint  Patient presents with   Follow-up: 71 year old Caucasian female with history of depression, anxiety, PTSD, presented for medication management   HPI: ALEKSA Fields " [name]" , is a 71 year old Caucasian female, married, retired, lives in [place] has a history of PTSD, GAD, MDD, tobacco use disorder, osteoporosis, arthritis was evaluated in office today.  Patient today reports since being on the combination of duloxetine, prazosin she has noticed an improvement in her mood.  She reports she feels more calm and less depressed on the current combination of medication.  Patient reports sleep is overall good on the prazosin.  She reports she had 1 episode of nightmare since her last visit.  It looks like the prazosin is helpful.  Denies side effects.  Patient reports she continues to struggle with anxiety especially when she has to be in social situations.  She reports while she was waiting out in the lobby today and once it started getting filled up she started feeling more and more anxious.  This has been going on a lot.  She has been unable to find a therapist however continues to be motivated to start therapy.  Currently not using the Xanax.  Patient denies any suicidality, homicidality or perceptual disturbances.  Patient denies any other concerns today.  Visit Diagnosis:    ICD-10-CM   1. PTSD (post-traumatic stress disorder)  F43.10 DULoxetine (CYMBALTA) 60 MG capsule    prazosin (MINIPRESS) 1 MG capsule    propranolol (INDERAL) 10 MG tablet    2. GAD (generalized anxiety disorder)  F41.1 DULoxetine (CYMBALTA) 60 MG capsule    prazosin (MINIPRESS) 1 MG capsule    propranolol (INDERAL) 10 MG tablet    3. MDD (major depressive disorder), recurrent episode, mild (HCC)  F33.0 DULoxetine (CYMBALTA) 60 MG capsule    4. Tobacco use disorder  F17.200     5. High risk  medication use  Z79.899       Past Psychiatric History: Reviewed past psychiatric history from progress note on 02/15/2022.  Past trials of Lexapro, hydroxyzine.  Past Medical History:  Past Medical History:  Diagnosis Date   Complication of anesthesia    takes a long time to wake up from anesthesia   Depression    HLD (hyperlipidemia) 07/22/2019   Osteoporosis 07/22/2019   PMB (postmenopausal bleeding) 03/11/2016   Post-menopause on HRT (hormone replacement therapy) 03/11/2016   Skin cancer    Recent to anterior chest; cervical cancer   Thickened endometrium 03/16/2016   Vaginal Pap smear, abnormal    Vitamin D deficiency disease 07/22/2019    Past Surgical History:  Procedure Laterality Date   BREAST ENHANCEMENT SURGERY     CERVICAL BIOPSY     precancerous years ago   CHOLECYSTECTOMY  2011   COLONOSCOPY N/A 05/08/2013   Procedure: COLONOSCOPY;  Surgeon: Rogene Houston, MD;  [facility];  Service: Endoscopy;  Laterality: N/A;  200   HYSTEROSCOPY WITH D & C N/A 10/05/2016   Procedure: HYSTEROSCOPY; UTERINE CURETTAGE;  Surgeon: Florian Buff, MD;  Location: [facility];  Service: Gynecology;  Laterality: N/A;   POLYPECTOMY N/A 10/05/2016   Procedure: REMOVAL OF ENDOMETRIAL POLYP;  Surgeon: Florian Buff, MD;  Location: [facility];  Service: Gynecology;  Laterality: N/A;   TUBAL LIGATION      Family Psychiatric History: Reviewed family psychiatric history from progress note on  02/15/2022.  Family History:  Family History  Problem Relation Age of Onset   Dementia Mother    Hyperlipidemia Mother    Thyroid disease Mother    Hypertension Mother    Schizophrenia Father    Alcohol abuse Father        Schizophrenia   Depression Brother    Mental illness Brother        Schizophrenia (2 brothers); schizophrenia and bipolar (1 brother)   Heart attack Brother    Schizophrenia Brother    Kidney failure Brother    Bipolar disorder Brother    Alcohol abuse Maternal Uncle    Alcohol  abuse Paternal Uncle    Alzheimer's disease Maternal Grandfather    Cancer Maternal Grandmother        colon   Diabetes Maternal Grandmother    Heart attack Paternal Grandfather    Heart disease Paternal Grandfather    Diabetes Paternal Grandfather    Mental illness Paternal Grandmother    Polycystic ovary syndrome Daughter    Other Daughter        knee replacement   Thyroid disease Daughter        had radiation on thyroid    Social History: Reviewed social history from progress note on 02/15/2022. Social History   Socioeconomic History   Marital status: Married    Spouse name: Not on file   Number of children: Not on file   Years of education: Not on file   Highest education level: Not on file  Occupational History   Not on file  Tobacco Use   Smoking status: Every Day    Packs/day: 0.50    Years: 37.00    Total pack years: 18.50    Types: Cigarettes   Smokeless tobacco: Never   Tobacco comments:    smokes 5 cig daily  Vaping Use   Vaping Use: Never used  Substance and Sexual Activity   Alcohol use: Not Currently    Comment: Quit September 2019   Drug use: No   Sexual activity: Yes    Birth control/protection: Post-menopausal  Other Topics Concern   Not on file  Social History Narrative   Married for 49 years.Retired,used to work at [employer].   Social Determinants of Health   Financial Resource Strain: Low Risk  (01/09/2020)   Overall Financial Resource Strain (CARDIA)    Difficulty of Paying Living Expenses: Not hard at all  Food Insecurity: No Food Insecurity (01/09/2020)   Hunger Vital Sign    Worried About Running Out of Food in the Last Year: Never true    Ran Out of Food in the Last Year: Never true  Transportation Needs: No Transportation Needs (01/09/2020)   PRAPARE - Hydrologist (Medical): No    Lack of Transportation (Non-Medical): No  Physical Activity: Sufficiently Active (01/09/2020)   Exercise Vital  Sign    Days of Exercise per Week: 5 days    Minutes of Exercise per Session: 30 min  Stress: No Stress Concern Present (01/09/2020)   [place]    Feeling of Stress : Only a little  Social Connections: Socially Integrated (01/09/2020)   Social Connection and Isolation Panel [NHANES]    Frequency of Communication with Friends and Family: Once a week    Frequency of Social Gatherings with Friends and Family: More than three times a week    Attends Religious Services: More than 4 times per  year    Active Member of Clubs or Organizations: Yes    Attends Archivist Meetings: More than 4 times per year    Marital Status: Married    Allergies: No Known Allergies  Metabolic Disorder Labs: No results found for: "HGBA1C", "MPG" No results found for: "PROLACTIN" Lab Results  Component Value Date   CHOL 239 (H) 01/21/2021   TRIG 86 01/21/2021   HDL 65 01/21/2021   CHOLHDL 3.7 01/21/2021   LDLCALC 155 (H) 01/21/2021   LDLCALC 134 (H) 05/28/2020   Lab Results  Component Value Date   TSH 1.070 03/07/2022    Therapeutic Level Labs: No results found for: "LITHIUM" No results found for: "VALPROATE" No results found for: "CBMZ"  Current Medications: Current Outpatient Medications  Medication Sig Dispense Refill   Calcium Carbonate (CALTRATE 600 PO) Take by mouth.     DULoxetine (CYMBALTA) 60 MG capsule Take 1 capsule (60 mg total) by mouth daily. 90 capsule 0   famotidine (PEPCID) 20 MG tablet Take 20 mg by mouth 2 (two) times daily.     Magnesium 250 MG TABS Take by mouth.     meloxicam (MOBIC) 7.5 MG tablet SMARTSIG:1 Tablet(s) By Mouth 1-2 Times Daily     propranolol (INDERAL) 10 MG tablet Take 1 tablet (10 mg total) by mouth 2 (two) times daily as needed. For severe anxiety attacks 60 tablet 1   simvastatin (ZOCOR) 5 MG tablet Take 5 mg by mouth at bedtime.     ALPRAZolam (XANAX) 0.25 MG tablet Take 0.25 mg  by mouth 2 (two) times daily as needed for anxiety. (Patient not taking: Reported on 03/15/2022)     prazosin (MINIPRESS) 1 MG capsule Take 1 capsule (1 mg total) by mouth at bedtime. 90 capsule 0   No current facility-administered medications for this visit.     Musculoskeletal: Strength & Muscle Tone: within normal limits Gait & Station: normal Patient leans: N/A  Psychiatric Specialty Exam: Review of Systems  Psychiatric/Behavioral:  Positive for dysphoric mood. The patient is nervous/anxious.   All other systems reviewed and are negative.   Blood pressure 118/73, pulse 81, temperature 98.3 F (36.8 C), temperature source Temporal, weight 124 lb 12.8 oz (56.6 kg).Body mass index is 22.83 kg/m.  General Appearance: Casual  Eye Contact:  Fair  Speech:  Clear and Coherent  Volume:  Normal  Mood:  Anxious and Depressed  Affect:  Congruent  Thought Process:  Goal Directed and Descriptions of Associations: Intact  Orientation:  Full (Time, Place, and Person)  Thought Content: Logical   Suicidal Thoughts:  No  Homicidal Thoughts:  No  Memory:  Immediate;   Fair Recent;   Fair Remote;   Fair  Judgement:  Fair  Insight:  Fair  Psychomotor Activity:  Normal  Concentration:  Concentration: Fair and Attention Span: Fair  Recall:  [employer] of Knowledge: Fair  Language: Fair  Akathisia:  No  Handed:  Right  AIMS (if indicated): not done  Assets:  Communication Skills Desire for Improvement Housing Social Support  ADL's:  Intact  Cognition: WNL  Sleep:   Improving   Screenings: GAD-7    Flowsheet Row Office Visit from 02/15/2022 in [facility] Office Visit from 01/09/2020 in [facility]  Total GAD-7 Score 15 7      PHQ2-9    [place] Visit from 03/15/2022 in [facility] Visit from 02/15/2022 in [facility]  Visit from 01/06/2021 in [facility] from 10/22/2020 in [facility] Office Visit from 08/18/2020 in [facility]  PHQ-2 Total Score 4 4 0 0 4  PHQ-9 Total Score 10 18 0 0 12      [place] Office Visit from 03/15/2022 in [facility] Office Visit from 02/15/2022 in [place] [facility] No Risk Low Risk        Assessment and Plan: Morgan Fields is a 71 year old Caucasian female who has a history of PTSD, GAD, depression unspecified was evaluated in office today.  Patient is currently improving although will continue to benefit from dose readjustment.  Plan as noted below.  Plan PTSD-improving Increase Cymbalta to 60 mg p.o. daily Prazosin 1 mg p.o. nightly Patient referred for CBT-provided resources.  GAD-unstable Increase Cymbalta to 60 mg p.o. daily Referred for CBT Start propranolol 10 mg p.o. twice daily as needed for severe anxiety attacks  MDD-improving Increase Cymbalta to 60 mg p.o. daily  High risk medication use-reviewed and discussed labs TSH, sodium, hepatic function panel-dated 03/07/2022-within normal limits. Also reviewed and discussed labs received from [facility] -dated 01/19/2022-reviewed lipid panel, CMP, CBC with differential, vitamin D, hemoglobin A1c.  Patient to continue to follow up with primary care provider.  Tobacco use disorder-unstable Will monitor closely  Follow-up in clinic in 4 - 6 weeks or sooner if needed.  This note was generated in part or whole with voice recognition software. Voice recognition is usually quite accurate but there are transcription errors that can and very often do occur. I apologize for any typographical errors that were not detected and corrected.     Ursula Alert, MD 03/16/2022, 5:16 AM

## 2022-05-04 DIAGNOSIS — Z79899 Other long term (current) drug therapy: Secondary | ICD-10-CM | POA: Diagnosis not present

## 2022-05-04 DIAGNOSIS — E785 Hyperlipidemia, unspecified: Secondary | ICD-10-CM | POA: Diagnosis not present

## 2022-05-11 ENCOUNTER — Encounter: Payer: Self-pay | Admitting: Psychiatry

## 2022-05-11 ENCOUNTER — Ambulatory Visit (INDEPENDENT_AMBULATORY_CARE_PROVIDER_SITE_OTHER): Payer: Medicare Other | Admitting: Psychiatry

## 2022-05-11 VITALS — BP 130/81 | HR 75 | Ht 62.0 in | Wt 120.0 lb

## 2022-05-11 DIAGNOSIS — F330 Major depressive disorder, recurrent, mild: Secondary | ICD-10-CM | POA: Insufficient documentation

## 2022-05-11 DIAGNOSIS — F17200 Nicotine dependence, unspecified, uncomplicated: Secondary | ICD-10-CM | POA: Diagnosis not present

## 2022-05-11 DIAGNOSIS — F419 Anxiety disorder, unspecified: Secondary | ICD-10-CM | POA: Diagnosis not present

## 2022-05-11 DIAGNOSIS — F339 Major depressive disorder, recurrent, unspecified: Secondary | ICD-10-CM | POA: Diagnosis not present

## 2022-05-11 DIAGNOSIS — F4310 Post-traumatic stress disorder, unspecified: Secondary | ICD-10-CM

## 2022-05-11 DIAGNOSIS — F411 Generalized anxiety disorder: Secondary | ICD-10-CM

## 2022-05-11 DIAGNOSIS — E7849 Other hyperlipidemia: Secondary | ICD-10-CM | POA: Diagnosis not present

## 2022-05-11 DIAGNOSIS — F3342 Major depressive disorder, recurrent, in full remission: Secondary | ICD-10-CM | POA: Diagnosis not present

## 2022-05-11 DIAGNOSIS — M1990 Unspecified osteoarthritis, unspecified site: Secondary | ICD-10-CM | POA: Diagnosis not present

## 2022-05-11 MED ORDER — PRAZOSIN HCL 1 MG PO CAPS: 1.0000 mg | ORAL_CAPSULE | Freq: Every day | ORAL | 1 refills | Status: DC

## 2022-05-11 MED ORDER — DULOXETINE HCL 60 MG PO CPEP: 60.0000 mg | ORAL_CAPSULE | Freq: Every day | ORAL | 1 refills | Status: DC

## 2022-05-11 NOTE — Progress Notes (Unsigned)
[facility] MD OP Progress Note  05/11/2022 8:57 AM Morgan Fields  MRN:  [id number]  Chief Complaint:  Chief Complaint  Patient presents with   Follow-up: 71 year old Caucasian female with history of depression, anxiety, PTSD, presented for medication management.   HPI: Morgan Fields ' [name] " is a 71 year old Caucasian female, married, retired, lives in [place], has a history of PTSD, GAD, MDD, tobacco use disorder, osteoporosis, arthritis was evaluated in office today.  Patient today reports since being on the current combination of medication she has been doing better with regards to her mood.  She reports she has been more self-aware, knows her triggers, is able to set boundaries.  Has been doing breathing techniques, relaxation techniques, tapping, as well as working in her yard, mowing, gardening.  She also reports she thinks that she uses what she watches on TV.  She has also talked about this to her spouse.  Overall she believes she is going in the right direction.  Reports sleep as good.  Does struggle with her appetite.  Reports she is aware that she needs to eat better.  May have lost 6 pounds or so in the past several months.  Will also have a discussion with her primary care provider.  Patient denies any suicidality, homicidality or perceptual disturbances.  Has been unable to find a therapist.  Will continue to work on it.  Denies side effects to medications.  Has been cutting back on smoking cigarettes, which has been a struggle however she is motivated to do so.  Denies any other concerns today. Visit Diagnosis:    ICD-10-CM   1. PTSD (post-traumatic stress disorder)  F43.10 prazosin (MINIPRESS) 1 MG capsule    DULoxetine (CYMBALTA) 60 MG capsule    2. GAD (generalized anxiety disorder)  F41.1 prazosin (MINIPRESS) 1 MG capsule    DULoxetine (CYMBALTA) 60 MG capsule    3. MDD (major depressive disorder), recurrent, in full remission ([place])  F33.42 DULoxetine  (CYMBALTA) 60 MG capsule    4. Tobacco use disorder  F17.200       Past Psychiatric History: Reviewed past psychiatric history from progress note on 02/15/2022.  Past trials of Lexapro, hydroxyzine.  Past Medical History:  Past Medical History:  Diagnosis Date   Complication of anesthesia    takes a long time to wake up from anesthesia   Depression    HLD (hyperlipidemia) 07/22/2019   Osteoporosis 07/22/2019   PMB (postmenopausal bleeding) 03/11/2016   Post-menopause on HRT (hormone replacement therapy) 03/11/2016   Skin cancer    Recent to anterior chest; cervical cancer   Thickened endometrium 03/16/2016   Vaginal Pap smear, abnormal    Vitamin D deficiency disease 07/22/2019    Past Surgical History:  Procedure Laterality Date   BREAST ENHANCEMENT SURGERY     CERVICAL BIOPSY     precancerous years ago   CHOLECYSTECTOMY  2011   COLONOSCOPY N/A 05/08/2013   Procedure: COLONOSCOPY;  Surgeon: Rogene Houston, MD;  [facility];  Service: Endoscopy;  Laterality: N/A;  200   HYSTEROSCOPY WITH D & C N/A 10/05/2016   Procedure: HYSTEROSCOPY; UTERINE CURETTAGE;  Surgeon: Florian Buff, MD;  Location: [facility];  Service: Gynecology;  Laterality: N/A;   POLYPECTOMY N/A 10/05/2016   Procedure: REMOVAL OF ENDOMETRIAL POLYP;  Surgeon: Florian Buff, MD;  Location: [facility];  Service: Gynecology;  Laterality: N/A;   TUBAL LIGATION      Family Psychiatric History: Reviewed family psychiatric history  from progress note on 02/15/2022.  Family History:  Family History  Problem Relation Age of Onset   Dementia Mother    Hyperlipidemia Mother    Thyroid disease Mother    Hypertension Mother    Schizophrenia Father    Alcohol abuse Father        Schizophrenia   Depression Brother    Mental illness Brother        Schizophrenia (2 brothers); schizophrenia and bipolar (1 brother)   Heart attack Brother    Schizophrenia Brother    Kidney failure Brother    Bipolar disorder Brother     Alcohol abuse Maternal Uncle    Alcohol abuse Paternal Uncle    Alzheimer's disease Maternal Grandfather    Cancer Maternal Grandmother        colon   Diabetes Maternal Grandmother    Heart attack Paternal Grandfather    Heart disease Paternal Grandfather    Diabetes Paternal Grandfather    Mental illness Paternal Grandmother    Polycystic ovary syndrome Daughter    Other Daughter        knee replacement   Thyroid disease Daughter        had radiation on thyroid    Social History: Reviewed social history from progress note on 02/15/2022. Social History   Socioeconomic History   Marital status: Married    Spouse name: Not on file   Number of children: Not on file   Years of education: Not on file   Highest education level: Not on file  Occupational History   Not on file  Tobacco Use   Smoking status: Every Day    Packs/day: 0.50    Years: 37.00    Total pack years: 18.50    Types: Cigarettes   Smokeless tobacco: Never   Tobacco comments:    smokes 5 cig daily  Vaping Use   Vaping Use: Never used  Substance and Sexual Activity   Alcohol use: Not Currently    Comment: Quit September 2019   Drug use: No   Sexual activity: Yes    Birth control/protection: Post-menopausal  Other Topics Concern   Not on file  Social History Narrative   Married for 49 years.Retired,used to work at [employer].   Social Determinants of Health   Financial Resource Strain: Low Risk  (01/09/2020)   Overall Financial Resource Strain (CARDIA)    Difficulty of Paying Living Expenses: Not hard at all  Food Insecurity: No Food Insecurity (01/09/2020)   Hunger Vital Sign    Worried About Running Out of Food in the Last Year: Never true    Ran Out of Food in the Last Year: Never true  Transportation Needs: No Transportation Needs (01/09/2020)   PRAPARE - Hydrologist (Medical): No    Lack of Transportation (Non-Medical): No  Physical Activity: Sufficiently  Active (01/09/2020)   Exercise Vital Sign    Days of Exercise per Week: 5 days    Minutes of Exercise per Session: 30 min  Stress: No Stress Concern Present (01/09/2020)   [place]    Feeling of Stress : Only a little  Social Connections: Socially Integrated (01/09/2020)   Social Connection and Isolation Panel [NHANES]    Frequency of Communication with Friends and Family: Once a week    Frequency of Social Gatherings with Friends and Family: More than three times a week    Attends Religious Services: More  than 4 times per year    Active Member of [place] or Organizations: Yes    Attends Archivist Meetings: More than 4 times per year    Marital Status: Married    Allergies: No Known Allergies  Metabolic Disorder Labs: No results found for: "HGBA1C", "MPG" No results found for: "PROLACTIN" Lab Results  Component Value Date   CHOL 239 (H) 01/21/2021   TRIG 86 01/21/2021   HDL 65 01/21/2021   CHOLHDL 3.7 01/21/2021   LDLCALC 155 (H) 01/21/2021   LDLCALC 134 (H) 05/28/2020   Lab Results  Component Value Date   TSH 1.070 03/07/2022    Therapeutic Level Labs: No results found for: "LITHIUM" No results found for: "VALPROATE" No results found for: "CBMZ"  Current Medications: Current Outpatient Medications  Medication Sig Dispense Refill   Calcium Carbonate (CALTRATE 600 PO) Take by mouth.     famotidine (PEPCID) 20 MG tablet Take 20 mg by mouth 2 (two) times daily.     Magnesium 250 MG TABS Take by mouth.     meloxicam (MOBIC) 7.5 MG tablet SMARTSIG:1 Tablet(s) By Mouth 1-2 Times Daily     propranolol (INDERAL) 10 MG tablet Take 1 tablet (10 mg total) by mouth 2 (two) times daily as needed. For severe anxiety attacks 60 tablet 1   simvastatin (ZOCOR) 5 MG tablet Take 5 mg by mouth at bedtime.     ALPRAZolam (XANAX) 0.25 MG tablet Take 0.25 mg by mouth 2 (two) times daily as needed for anxiety.  (Patient not taking: Reported on 05/11/2022)     DULoxetine (CYMBALTA) 60 MG capsule Take 1 capsule (60 mg total) by mouth daily. 90 capsule 1   prazosin (MINIPRESS) 1 MG capsule Take 1 capsule (1 mg total) by mouth at bedtime. 90 capsule 1   No current facility-administered medications for this visit.     Musculoskeletal: Strength & Muscle Tone: within normal limits Gait & Station: normal Patient leans: N/A  Psychiatric Specialty Exam: Review of Systems  Psychiatric/Behavioral:  The patient is nervous/anxious (coping well).   All other systems reviewed and are negative.   Blood pressure 130/81, pulse 75, height '5\' 2"'$  (1.575 m), weight 120 lb (54.4 kg).Body mass index is 21.95 kg/m.  General Appearance: Casual  Eye Contact:  Fair  Speech:  Clear and Coherent  Volume:  Normal  Mood:  Anxious coping well  Affect:  Full Range  Thought Process:  Goal Directed and Descriptions of Associations: Intact  Orientation:  Full (Time, Place, and Person)  Thought Content: Logical   Suicidal Thoughts:  No  Homicidal Thoughts:  No  Memory:  Immediate;   Fair Recent;   Fair Remote;   Fair  Judgement:  Fair  Insight:  Fair  Psychomotor Activity:  Normal  Concentration:  Concentration: Fair and Attention Span: Fair  Recall:  [employer] of Knowledge: Fair  Language: Fair  Akathisia:  No  Handed:  Right  AIMS (if indicated): done  Assets:  Communication Skills Desire for [place] Talents/Skills Transportation  ADL's:  Intact  Cognition: WNL  Sleep:  Fair   Screenings: GAD-7    Personnel officer Visit from 05/11/2022 in [facility] Office Visit from 02/15/2022 in [facility] Office Visit from 01/09/2020 in [facility]  Total GAD-7 Score '5 15 7      '$ PHQ2-9    [place] Visit from 05/11/2022 in [facility]  Visit from 03/15/2022 in  [facility] Visit from 02/15/2022 in [facility] Office Visit from 01/06/2021 in [facility] from 10/22/2020 in [place]  PHQ-2 Total Score '2 4 4 '$ 0 0  PHQ-9 Total Score '8 10 18 '$ 0 0      Flowsheet Row Office Visit from 03/15/2022 in [facility] Office Visit from 02/15/2022 in [place] No Risk Low Risk        Assessment and Plan: MIKAYA BUNNER is a 71 year old Caucasian female who has a history of PTSD, GAD, was evaluated in office today.  Patient is currently improving, will benefit from the following plan.  Plan PTSD-improving Cymbalta 60 mg p.o. daily Prazosin 1 mg p.o. nightly Patient advised to establish care with a therapist-provided more resources.  GAD-improving Cymbalta 60 mg p.o. daily Propranolol 10 mg p.o. twice daily as needed for severe anxiety attacks.  MDD in remission Cymbalta 60 mg p.o. daily  Tobacco use disorder-unstable Provided counseling for 1 minute.  Patient continues to cut back  Patient advised to watch her diet, supplement protein shakes.  Also advised to talk to her primary care provider for her appetite change.  Follow-up in clinic in 3 months or sooner if needed.  This note was generated in part or whole with voice recognition software. Voice recognition is usually quite accurate but there are transcription errors that can and very often do occur. I apologize for any typographical errors that were not detected and corrected.    Ursula Alert, MD 05/11/2022, 8:57 AM

## 2022-06-02 ENCOUNTER — Ambulatory Visit (INDEPENDENT_AMBULATORY_CARE_PROVIDER_SITE_OTHER): Payer: Medicare Other | Admitting: Clinical

## 2022-06-02 ENCOUNTER — Encounter (HOSPITAL_COMMUNITY): Payer: Self-pay

## 2022-06-02 DIAGNOSIS — F331 Major depressive disorder, recurrent, moderate: Secondary | ICD-10-CM | POA: Diagnosis not present

## 2022-06-02 DIAGNOSIS — F17200 Nicotine dependence, unspecified, uncomplicated: Secondary | ICD-10-CM | POA: Diagnosis not present

## 2022-06-02 DIAGNOSIS — F4310 Post-traumatic stress disorder, unspecified: Secondary | ICD-10-CM | POA: Diagnosis not present

## 2022-06-02 DIAGNOSIS — F419 Anxiety disorder, unspecified: Secondary | ICD-10-CM

## 2022-06-02 NOTE — Plan of Care (Signed)
Verbal Consent 
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
## 2022-06-22 ENCOUNTER — Other Ambulatory Visit: Payer: Self-pay | Admitting: Psychiatry

## 2022-06-22 DIAGNOSIS — F4310 Post-traumatic stress disorder, unspecified: Secondary | ICD-10-CM

## 2022-06-22 DIAGNOSIS — F411 Generalized anxiety disorder: Secondary | ICD-10-CM

## 2022-06-23 DIAGNOSIS — Z23 Encounter for immunization: Secondary | ICD-10-CM | POA: Diagnosis not present

## 2022-06-28 DIAGNOSIS — K580 Irritable bowel syndrome with diarrhea: Secondary | ICD-10-CM | POA: Diagnosis not present

## 2022-06-28 DIAGNOSIS — K5989 Other specified functional intestinal disorders: Secondary | ICD-10-CM | POA: Diagnosis not present

## 2022-06-29 ENCOUNTER — Ambulatory Visit (INDEPENDENT_AMBULATORY_CARE_PROVIDER_SITE_OTHER): Payer: Medicare Other | Admitting: Clinical

## 2022-06-29 DIAGNOSIS — F4310 Post-traumatic stress disorder, unspecified: Secondary | ICD-10-CM

## 2022-06-29 DIAGNOSIS — F331 Major depressive disorder, recurrent, moderate: Secondary | ICD-10-CM

## 2022-06-29 DIAGNOSIS — F419 Anxiety disorder, unspecified: Secondary | ICD-10-CM

## 2022-06-29 DIAGNOSIS — F17200 Nicotine dependence, unspecified, uncomplicated: Secondary | ICD-10-CM

## 2022-06-29 NOTE — Progress Notes (Signed)
IN PERSON  I connected with Morgan Fields on 06/29/22 at  9:00 AM EDT in person and verified that I am speaking with the correct person using two identifiers.  Location: Patient: Office Provider: Office   I discussed the limitations of evaluation and management by telemedicine and the availability of in person appointments. The patient expressed understanding and agreed to proceed.   THERAPIST PROGRESS NOTE   Session Time: 9:00 AM-9:55 AM   Participation Level: Active   Behavioral Response: CasualAlertDepressed   Type of Therapy: Individual Therapy   Treatment Goals addressed: Coping   Interventions: CBT, DBT, Solution Focused, Strength-based and Supportive   Summary: Morgan Fields is a 71 y.o. female who presents with PTSD as well as  Depression with Anxiety. The OPT therapist worked with the patient for her OPT treatment. The OPT therapist utilized Motivational Interviewing to assist in creating therapeutic repore. The patient in the session was engaged and work in collaboration giving feedback about her stressors over the past few weeks. The patient identified changes that she needs to make to help her boost/alleviate her mood including dedicating part of her time to things she has interest in and not continuing to put herself on the back-burner. The OPT therapist utilized Cognitive Behavioral Therapy through cognitive restructuring as well as worked with the patient on coping strategies to assist in management of mood including ongoing motivation and empowerment for the patient to be as active as possible with her current health limitations and to dedicate part of her time to her own leisure and self care. The patient spoke about planned upcoming social outting going to lunch with friends on Friday.   Suicidal/Homicidal: Nowithout intent/plan   Therapist Response: The OPT therapist worked with the patient for the patients scheduled session. The patient was engaged in her  session and gave feedback in relation to triggers, symptoms, and behavior responses over the past few weeks.The OPT therapist worked with the patient utilizing an in session Cognitive Behavioral Therapy exercise. The patient was responsive in the session and verbalized, " I realize I have just been so use to taking care of other people I have a hard time putting myself first and not feeling guilty ". The OPT therapist worked with the patient on balancing her stressors with leisure and utilizing her support network. Managing different sections of her life. The patient is currently working on painting in the home and verbalized she is going to focus on her leisure time more consistently to help her manage her overall general stress so that it does not continue to negatively impact her interactions and mood.      Plan: Return again in 2 weeks.   Diagnosis:      Axis I:  PTSD/Major depressive disorder, recurrent episode, moderate with anxious distress /Tobacco Use                           Axis II: No diagnosis     Collaboration of Care: No additional collaboration of care for this session   Patient/Guardian was advised Release of Information must be obtained prior to any record release in order to collaborate their care with an outside provider. Patient/Guardian was advised if they have not already done so to contact the registration department to sign all necessary forms in order for [place] to release information regarding their care.    Consent: Patient/Guardian gives verbal consent for treatment and assignment of benefits for services provided  during this visit. Patient/Guardian expressed understanding and agreed to proceed.    I discussed the assessment and treatment plan with the patient. The patient was provided an opportunity to ask questions and all were answered. The patient agreed with the plan and demonstrated an understanding of the instructions.   The patient was advised to call back or seek  an in-person evaluation if the symptoms worsen or if the condition fails to improve as anticipated.   I provided 55 minutes of face-to-face time during this encounter.   Lennox Grumbles, LCSW   06/29/2022

## 2022-07-05 DIAGNOSIS — Z23 Encounter for immunization: Secondary | ICD-10-CM | POA: Diagnosis not present

## 2022-07-20 ENCOUNTER — Ambulatory Visit (INDEPENDENT_AMBULATORY_CARE_PROVIDER_SITE_OTHER): Payer: Medicare Other | Admitting: Clinical

## 2022-07-20 DIAGNOSIS — F331 Major depressive disorder, recurrent, moderate: Secondary | ICD-10-CM

## 2022-07-20 DIAGNOSIS — F4310 Post-traumatic stress disorder, unspecified: Secondary | ICD-10-CM | POA: Diagnosis not present

## 2022-07-20 DIAGNOSIS — F419 Anxiety disorder, unspecified: Secondary | ICD-10-CM

## 2022-07-20 DIAGNOSIS — F17200 Nicotine dependence, unspecified, uncomplicated: Secondary | ICD-10-CM | POA: Diagnosis not present

## 2022-07-20 NOTE — Progress Notes (Signed)
IN PERSON   I connected with Morgan Fields on 09/19/21 at  10:00 AM EDT in person and verified that I am speaking with the correct person using two identifiers.   Location: Patient: Office Provider: Office   I discussed the limitations of evaluation and management by telemedicine and the availability of in person appointments. The patient expressed understanding and agreed to proceed.     THERAPIST PROGRESS NOTE   Session Time: 10:00 AM-10:45 AM   Participation Level: Active   Behavioral Response: CasualAlertDepressed   Type of Therapy: Individual Therapy   Treatment Goals addressed: Coping   Interventions: CBT, DBT, Solution Focused, Strength-based and Supportive   Summary: Morgan Fields is a 71 y.o. female who presents with PTSD as well as  Depression with Anxiety. The OPT therapist worked with the patient for her OPT treatment. The OPT therapist utilized Motivational Interviewing to assist in creating therapeutic repore. The patient in the session was engaged and work in collaboration giving feedback about her stressors over the past few weeks. The patient identified changes that she has been implementing including not putting herself on the back-burner as much and utilizing a time Actuary (planner) which has helped her to section her day, journal, and balance work/leisure time. The OPT therapist utilized Cognitive Behavioral Therapy through cognitive restructuring as well as worked with the patient on coping strategies to assist in management of mood including ongoing motivation and empowerment for the patient to be as active as possible with her current health limitations and to dedicate part of her time to her own leisure and self care. The patient spoke about planned upcoming holidays and her work to not feel guilty about having her own time or spending on herself.   Suicidal/Homicidal: Nowithout intent/plan   Therapist Response: The OPT therapist worked with  the patient for the patients scheduled session. The patient was engaged in her session and gave feedback in relation to triggers, symptoms, and behavior responses over the past few weeks.The OPT therapist worked with the patient utilizing an in session Cognitive Behavioral Therapy exercise. The patient was responsive in the session and verbalized, " I have been using this planner like we talked about and time managing and it has just made a world of difference and really helped with my mood and anxiety". The OPT therapist worked with the patient on balancing her stressors with leisure and utilizing her support network. Managing different sections of her life. The patient is currently working on painting in the home and verbalized she is going to focus on her leisure time more consistently to help her manage her overall general stress and work to not feel guilty through "moving the margins" for her self focus.      Plan: Return again in 2 weeks.   Diagnosis:      Axis I:  PTSD/Major depressive disorder, recurrent episode, moderate with anxious distress /Tobacco Use                           Axis II: No diagnosis     Collaboration of Care: No additional collaboration of care for this session   Patient/Guardian was advised Release of Information must be obtained prior to any record release in order to collaborate their care with an outside provider. Patient/Guardian was advised if they have not already done so to contact the registration department to sign all necessary forms in order for [place] to release information regarding their  care.    Consent: Patient/Guardian gives verbal consent for treatment and assignment of benefits for services provided during this visit. Patient/Guardian expressed understanding and agreed to proceed.    I discussed the assessment and treatment plan with the patient. The patient was provided an opportunity to ask questions and all were answered. The patient agreed with the  plan and demonstrated an understanding of the instructions.   The patient was advised to call back or seek an in-person evaluation if the symptoms worsen or if the condition fails to improve as anticipated.   I provided 45 minutes of face-to-face time during this encounter.   Lennox Grumbles, LCSW   07/20/2022

## 2022-08-03 DIAGNOSIS — K580 Irritable bowel syndrome with diarrhea: Secondary | ICD-10-CM | POA: Diagnosis not present

## 2022-08-24 ENCOUNTER — Ambulatory Visit (INDEPENDENT_AMBULATORY_CARE_PROVIDER_SITE_OTHER): Payer: Medicare Other | Admitting: Psychiatry

## 2022-08-24 ENCOUNTER — Encounter: Payer: Self-pay | Admitting: Psychiatry

## 2022-08-24 VITALS — BP 118/74 | HR 78 | Temp 97.9°F | Ht 62.0 in | Wt 116.6 lb

## 2022-08-24 DIAGNOSIS — F331 Major depressive disorder, recurrent, moderate: Secondary | ICD-10-CM | POA: Insufficient documentation

## 2022-08-24 DIAGNOSIS — F17200 Nicotine dependence, unspecified, uncomplicated: Secondary | ICD-10-CM | POA: Diagnosis not present

## 2022-08-24 DIAGNOSIS — F411 Generalized anxiety disorder: Secondary | ICD-10-CM | POA: Diagnosis not present

## 2022-08-24 DIAGNOSIS — F3342 Major depressive disorder, recurrent, in full remission: Secondary | ICD-10-CM | POA: Diagnosis not present

## 2022-08-24 DIAGNOSIS — F4310 Post-traumatic stress disorder, unspecified: Secondary | ICD-10-CM | POA: Diagnosis not present

## 2022-08-24 MED ORDER — PROPRANOLOL HCL 10 MG PO TABS: ORAL_TABLET | ORAL | 1 refills | Status: DC

## 2022-08-24 NOTE — Progress Notes (Unsigned)
[facility] OP Progress Note  08/24/2022 9:02 AM Morgan Fields  MRN:  [id number]  Chief Complaint:  Chief Complaint  Patient presents with   Follow-up   Medication Refill   Anxiety   Depression   HPI: Morgan Fields is a 71 year old Caucasian female, married, retired, lives in [place], has a history of PTSD, GAD, MDD, tobacco use disorder, osteoporosis, arthritis was evaluated in office today.  Patient today reports Thanksgiving was anxiety provoking since she and her spouse were alone.  She reports although she was agitated and anxious she was able to relax and calm herself down with the support of her spouse.  She has started psychotherapy sessions with Mr. Maye Hides with [facility].  She reports she has been learning a lot of coping strategies and enjoys therapy.  Motivated to stay in therapy.  Reports trauma related symptoms has improved.  She may have had 1 nightmare since her last visit with Probation officer.  And this was around the time of Thanksgiving when she was anxious.  She however reports that dreams does not affect her sleep and it does not make her anxious as it used to before.  Patient denies any sadness, hopelessness, suicidality, homicidality or perceptual disturbances.  Currently compliant on the Cymbalta and the prazosin.  Uses the propranolol as needed which helps with anxiety.  Has not been using Xanax.  Denies side effects to medications.  Denies any other concerns today.  Visit Diagnosis:    ICD-10-CM   1. PTSD (post-traumatic stress disorder)  F43.10 propranolol (INDERAL) 10 MG tablet    2. GAD (generalized anxiety disorder)  F41.1 propranolol (INDERAL) 10 MG tablet    3. MDD (major depressive disorder), recurrent, in full remission ([place])  F33.42     4. Tobacco use disorder  F17.200       Past Psychiatric History: Reviewed past psychiatric history from progress note on 02/15/2022.  Past trials of Lexapro, hydroxyzine.  Past Medical History:  Past  Medical History:  Diagnosis Date   Complication of anesthesia    takes a long time to wake up from anesthesia   Depression    HLD (hyperlipidemia) 07/22/2019   Osteoporosis 07/22/2019   PMB (postmenopausal bleeding) 03/11/2016   Post-menopause on HRT (hormone replacement therapy) 03/11/2016   Skin cancer    Recent to anterior chest; cervical cancer   Thickened endometrium 03/16/2016   Vaginal Pap smear, abnormal    Vitamin D deficiency disease 07/22/2019    Past Surgical History:  Procedure Laterality Date   BREAST ENHANCEMENT SURGERY     CERVICAL BIOPSY     precancerous years ago   CHOLECYSTECTOMY  2011   COLONOSCOPY N/A 05/08/2013   Procedure: COLONOSCOPY;  Surgeon: Rogene Houston, MD;  Location: [facility];  Service: Endoscopy;  Laterality: N/A;  200   HYSTEROSCOPY WITH D & C N/A 10/05/2016   Procedure: HYSTEROSCOPY; UTERINE CURETTAGE;  Surgeon: Florian Buff, MD;  Location: [facility];  Service: Gynecology;  Laterality: N/A;   POLYPECTOMY N/A 10/05/2016   Procedure: REMOVAL OF ENDOMETRIAL POLYP;  Surgeon: Florian Buff, MD;  Location: [facility];  Service: Gynecology;  Laterality: N/A;   TUBAL LIGATION      Family Psychiatric History: Reviewed family psychiatric history from progress note on 02/15/2022.  Family History:  Family History  Problem Relation Age of Onset   Dementia Mother    Hyperlipidemia Mother    Thyroid disease Mother    Hypertension Mother    Schizophrenia  Father    Alcohol abuse Father        Schizophrenia   Depression Brother    Mental illness Brother        Schizophrenia (2 brothers); schizophrenia and bipolar (1 brother)   Heart attack Brother    Schizophrenia Brother    Kidney failure Brother    Bipolar disorder Brother    Alcohol abuse Maternal Uncle    Alcohol abuse Paternal Uncle    Alzheimer's disease Maternal Grandfather    Cancer Maternal Grandmother        colon   Diabetes Maternal Grandmother    Heart attack Paternal Grandfather    Heart  disease Paternal Grandfather    Diabetes Paternal Grandfather    Mental illness Paternal Grandmother    Polycystic ovary syndrome Daughter    Other Daughter        knee replacement   Thyroid disease Daughter        had radiation on thyroid    Social History: Reviewed social history from progress note on 02/15/2022. Social History   Socioeconomic History   Marital status: Married    Spouse name: Not on file   Number of children: Not on file   Years of education: Not on file   Highest education level: Not on file  Occupational History   Not on file  Tobacco Use   Smoking status: Every Day    Packs/day: 0.50    Years: 37.00    Total pack years: 18.50    Types: Cigarettes   Smokeless tobacco: Never   Tobacco comments:    smokes 5 cig daily  Vaping Use   Vaping Use: Never used  Substance and Sexual Activity   Alcohol use: Not Currently    Comment: Quit September 2019   Drug use: No   Sexual activity: Yes    Birth control/protection: Post-menopausal  Other Topics Concern   Not on file  Social History Narrative   Married for 49 years.Retired,used to work at [employer].   Social Determinants of Health   Financial Resource Strain: Low Risk  (01/09/2020)   Overall Financial Resource Strain (CARDIA)    Difficulty of Paying Living Expenses: Not hard at all  Food Insecurity: No Food Insecurity (01/09/2020)   Hunger Vital Sign    Worried About Running Out of Food in the Last Year: Never true    Ran Out of Food in the Last Year: Never true  Transportation Needs: No Transportation Needs (01/09/2020)   PRAPARE - Hydrologist (Medical): No    Lack of Transportation (Non-Medical): No  Physical Activity: Sufficiently Active (01/09/2020)   Exercise Vital Sign    Days of Exercise per Week: 5 days    Minutes of Exercise per Session: 30 min  Stress: No Stress Concern Present (01/09/2020)   [place]    Feeling of Stress : Only a little  Social Connections: Socially Integrated (01/09/2020)   Social Connection and Isolation Panel [NHANES]    Frequency of Communication with Friends and Family: Once a week    Frequency of Social Gatherings with Friends and Family: More than three times a week    Attends Religious Services: More than 4 times per year    Active Member of [employer] or Organizations: Yes    Attends Music therapist: More than 4 times per year    Marital Status: Married    Allergies: No Known  Allergies  Metabolic Disorder Labs: No results found for: "HGBA1C", "MPG" No results found for: "PROLACTIN" Lab Results  Component Value Date   CHOL 239 (H) 01/21/2021   TRIG 86 01/21/2021   HDL 65 01/21/2021   CHOLHDL 3.7 01/21/2021   LDLCALC 155 (H) 01/21/2021   LDLCALC 134 (H) 05/28/2020   Lab Results  Component Value Date   TSH 1.070 03/07/2022    Therapeutic Level Labs: No results found for: "LITHIUM" No results found for: "VALPROATE" No results found for: "CBMZ"  Current Medications: Current Outpatient Medications  Medication Sig Dispense Refill   Calcium Carbonate (CALTRATE 600 PO) Take by mouth.     dicyclomine (BENTYL) 10 MG capsule Take 10 mg by mouth 3 (three) times daily.     DULoxetine (CYMBALTA) 60 MG capsule Take 1 capsule (60 mg total) by mouth daily. 90 capsule 1   famotidine (PEPCID) 20 MG tablet Take 20 mg by mouth 2 (two) times daily.     loperamide (IMODIUM) 2 MG capsule Take by mouth.     meloxicam (MOBIC) 7.5 MG tablet SMARTSIG:1 Tablet(s) By Mouth 1-2 Times Daily     prazosin (MINIPRESS) 1 MG capsule Take 1 capsule (1 mg total) by mouth at bedtime. 90 capsule 1   simvastatin (ZOCOR) 5 MG tablet Take 5 mg by mouth at bedtime.     Magnesium 250 MG TABS Take by mouth. (Patient not taking: Reported on 08/24/2022)     propranolol (INDERAL) 10 MG tablet TAKE (1) TABLET BY MOUTH TWICE A DAY AS NEEDED FOR ANXIETY. 180 tablet  1   No current facility-administered medications for this visit.     Musculoskeletal: Strength & Muscle Tone: within normal limits Gait & Station: normal Patient leans: N/A  Psychiatric Specialty Exam: Review of Systems  Gastrointestinal:  Positive for diarrhea and nausea.  Psychiatric/Behavioral: Negative.    All other systems reviewed and are negative.   Blood pressure 118/74, pulse 78, temperature 97.9 F (36.6 C), temperature source Oral, height '5\' 2"'$  (1.575 m), weight 116 lb 9.6 oz (52.9 kg).Body mass index is 21.33 kg/m.  General Appearance: Casual  Eye Contact:  Fair  Speech:  Clear and Coherent  Volume:  Normal  Mood:  Euthymic  Affect:  Full Range  Thought Process:  Goal Directed and Descriptions of Associations: Intact  Orientation:  Full (Time, Place, and Person)  Thought Content: Logical   Suicidal Thoughts:  No  Homicidal Thoughts:  No  Memory:  Immediate;   Fair Recent;   Fair Remote;   Fair  Judgement:  Fair  Insight:  Fair  Psychomotor Activity:  Normal  Concentration:  Concentration: Fair and Attention Span: Fair  Recall:  [employer] of Knowledge: Fair  Language: Fair  Akathisia:  No  Handed:  Right  AIMS (if indicated): not done  Assets:  Communication Skills Desire for Improvement Housing Intimacy Social Support  ADL's:  Intact  Cognition: WNL  Sleep:  Fair   Screenings: [place] Office Visit from 05/11/2022 in [facility] Total Score 0      GAD-7    [place] Office Visit from 08/24/2022 in [facility] Counselor from 06/02/2022 in [place] Office Visit from 05/11/2022 in [place] Office Visit from 02/15/2022 in [place] Office Visit from 01/09/2020 in [facility]  Total GAD-7 Score '7 20 5 15 '$ 7  [place] Office Visit  from 08/24/2022 in [facility] Counselor from 06/02/2022 in [place] Office Visit from 05/11/2022 in [place] Office Visit from 03/15/2022 in [place] Office Visit from 02/15/2022 in [place]  PHQ-2 Total Score '3 5 2 4 4  '$ PHQ-9 Total Score '10 18 8 10 18      '$ [place] Office Visit from 08/24/2022 in [facility] from 06/02/2022 in [place] Office Visit from 03/15/2022 in [place]  C-SSRS RISK CATEGORY No Risk No Risk No Risk        Assessment and Plan: Morgan Fields is a 71 year old Caucasian female who has a history of PTSD, GAD was evaluated in office today.  Patient is currently improving, motivated to stay in psychotherapy and on medications.  Plan as noted below.  Plan PTSD-stable Cymbalta 60 mg p.o. daily Prazosin 1 mg p.o. nightly Continue CBT with Mr. Maye Hides I have coordinated care.  GAD-improving Cymbalta 60 mg p.o. daily Propranolol 10 mg p.o. twice daily as needed for severe anxiety  MDD in remission Cymbalta 60 mg p.o. daily  Tobacco use disorder-unstable Will monitor closely  Follow-up in clinic in 3 to 4 months or sooner if needed.  This note was generated in part or whole with voice recognition software. Voice recognition is usually quite accurate but there are transcription errors that can and very often do occur. I apologize for any typographical errors that were not detected and corrected.      Ursula Alert, MD 08/25/2022, 12:44 PM

## 2022-09-01 ENCOUNTER — Ambulatory Visit (INDEPENDENT_AMBULATORY_CARE_PROVIDER_SITE_OTHER): Payer: Medicare Other | Admitting: Clinical

## 2022-09-01 DIAGNOSIS — F331 Major depressive disorder, recurrent, moderate: Secondary | ICD-10-CM | POA: Diagnosis not present

## 2022-09-01 DIAGNOSIS — F4310 Post-traumatic stress disorder, unspecified: Secondary | ICD-10-CM

## 2022-09-01 DIAGNOSIS — F419 Anxiety disorder, unspecified: Secondary | ICD-10-CM | POA: Diagnosis not present

## 2022-09-01 DIAGNOSIS — F17200 Nicotine dependence, unspecified, uncomplicated: Secondary | ICD-10-CM

## 2022-09-01 DIAGNOSIS — K589 Irritable bowel syndrome without diarrhea: Secondary | ICD-10-CM | POA: Diagnosis not present

## 2022-09-01 NOTE — Progress Notes (Signed)
IN PERSON   I connected with Morgan Fields on 09/01/22 at  10:00 AM EDT in person and verified that I am speaking with the correct person using two identifiers.   Location: Patient: Office Provider: Office   I discussed the limitations of evaluation and management by telemedicine and the availability of in person appointments. The patient expressed understanding and agreed to proceed. ( IN PERSON)     THERAPIST PROGRESS NOTE   Session Time: 10:00 AM-10:55 AM   Participation Level: Active   Behavioral Response: CasualAlertDepressed   Type of Therapy: Individual Therapy   Treatment Goals addressed: Coping   Interventions: CBT, DBT, Solution Focused, Strength-based and Supportive   Summary: Morgan Fields is a 71 y.o. female who presents with PTSD as well as  Depression with Anxiety. The OPT therapist worked with the patient for her OPT treatment. The OPT therapist utilized Motivational Interviewing to assist in creating therapeutic repore. The patient in the session was engaged and work in collaboration giving feedback about her stressors over the past few weeks. The patient identified changes that she has been implementing including not putting herself on the back-burner as much and utilizing a time Actuary (planner) which has helped her to section her day, journal, and balance work/leisure time. The OPT therapist utilized Cognitive Behavioral Therapy through cognitive restructuring as well as worked with the patient on coping strategies to assist in management of mood including ongoing motivation and empowerment for the patient to be as active as possible with her current health limitations and to dedicate part of her time to her own leisure and self care. The patient spoke about being more active in challenging negative self thought and not looking outward for validation. The patient spoke about her recent med management with Dr. Shea Evans and verbalized feeling her  medication is currently working well.    Suicidal/Homicidal: Nowithout intent/plan   Therapist Response: The OPT therapist worked with the patient for the patients scheduled session. The patient was engaged in her session and gave feedback in relation to triggers, symptoms, and behavior responses over the past few weeks.The OPT therapist worked with the patient utilizing an in session Cognitive Behavioral Therapy exercise. The patient was responsive in the session and verbalized, " I have been working to be more aware of and challenging negative thoughts and allowing myself to be more okay with my own self focus". The OPT therapist worked with the patient on balancing her stressors with leisure and utilizing her support network. Managing different sections of her life. The patient is currently working on painting in the home and verbalized she is going to focus on her leisure time more consistently to help her manage her overall general stress. 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 The OPT therapist will continue [place] treatment work with the patient in her next scheduled session.     Plan: Return again in 2 weeks.   Diagnosis:      Axis I:  PTSD/Major depressive disorder, recurrent episode, moderate with anxious distress /Tobacco Use                           Axis II: No diagnosis     Collaboration of Care: Overview of patient involvement in the med management program with Dr. Shea Evans.   Patient/Guardian  was advised Release of Information must be obtained prior to any record release in order to collaborate their care with an outside provider. Patient/Guardian was advised if they have not already done so to contact the registration department to sign all necessary forms in order for [place] to release information regarding their care.    Consent:  Patient/Guardian gives verbal consent for treatment and assignment of benefits for services provided during this visit. Patient/Guardian expressed understanding and agreed to proceed.    I discussed the assessment and treatment plan with the patient. The patient was provided an opportunity to ask questions and all were answered. The patient agreed with the plan and demonstrated an understanding of the instructions.   The patient was advised to call back or seek an in-person evaluation if the symptoms worsen or if the condition fails to improve as anticipated.   I provided 55 minutes of face-to-face time during this encounter.   Lennox Grumbles, LCSW   09/01/2022

## 2022-09-27 ENCOUNTER — Encounter (INDEPENDENT_AMBULATORY_CARE_PROVIDER_SITE_OTHER): Payer: Self-pay | Admitting: Gastroenterology

## 2022-09-27 ENCOUNTER — Ambulatory Visit (INDEPENDENT_AMBULATORY_CARE_PROVIDER_SITE_OTHER): Payer: Medicare Other | Admitting: Gastroenterology

## 2022-09-27 VITALS — BP 121/81 | HR 84 | Temp 98.0°F | Ht 62.0 in | Wt 117.0 lb

## 2022-09-27 DIAGNOSIS — R1084 Generalized abdominal pain: Secondary | ICD-10-CM | POA: Diagnosis not present

## 2022-09-27 DIAGNOSIS — K219 Gastro-esophageal reflux disease without esophagitis: Secondary | ICD-10-CM

## 2022-09-27 DIAGNOSIS — R112 Nausea with vomiting, unspecified: Secondary | ICD-10-CM

## 2022-09-27 DIAGNOSIS — R197 Diarrhea, unspecified: Secondary | ICD-10-CM | POA: Diagnosis not present

## 2022-09-27 DIAGNOSIS — R634 Abnormal weight loss: Secondary | ICD-10-CM | POA: Diagnosis not present

## 2022-09-27 DIAGNOSIS — T8859XA Other complications of anesthesia, initial encounter: Secondary | ICD-10-CM | POA: Insufficient documentation

## 2022-09-27 MED ORDER — FAMOTIDINE 40 MG PO TABS: 40.0000 mg | ORAL_TABLET | Freq: Two times a day (BID) | ORAL | 3 refills | Status: DC

## 2022-09-27 NOTE — Patient Instructions (Signed)
Lets check some stool studies to rule out infection and possibly pancreatic related diarrhea  Increase pepcid to '40mg'$  BID  We will schedule EGD, will consider adding on colonoscopy if stool studies unrevealing  Will also provide the Low FODMAP food guide as this can be helpful with IBS Stress management is very important in regards to your IBS  Follow up 3 months

## 2022-09-27 NOTE — Progress Notes (Addendum)
Referring Provider: Alanson Puls [facility] Primary Care Physician:  Yves Dill, NP Primary GI Physician: new   Chief Complaint  Patient presents with   Irritable Bowel Syndrome    New patient. Referred for IBS. Has diarrhea and loose stools. Has tried elimation diet, cutting out diary, cutting out sodas and caffeine. Tried imodium and caused depression and tried bentyl with no relief. Does not eat much because it will cause her to use bathroom. Has lost 12 lbs since August per patient.    HPI:   Morgan Fields is a 72 y.o. female with past medical history of depression, HLD, osteoporosis, vitamin d deficiency   Patient presenting today as a new patient.  Seen many years ago prior to colonoscopy in 2014, having 8-10 stools per day at that time. Advised to start dicyclomine '10mg'$  PO TID AC thereafter. Celiac panel at that time was negative.  Last labs in June 2023 with TSH 1.070, sodium 140, normal LFTs, CBC WNL in May 2023   Patient notes that back in 2014 she was going through some trauma/lots of stress, it was concluded then that diarrhea was related to this/IBS. She states that since August she has had diarrhea everyday. Does not seem to matter what she eats. She has done [employer], elimination diet.  She has taken bentyl but this just slows her diarrhea a little. Imodium previously seemed to cause some depression in the past. She has 4-5 episodes of diarrhea per day if she does not eat a lot, if she eats a heavier amount of food she will have more diarrhea, usually eats more in the evening can have 3-5 BMs throughout the evening and the night. Stools initially started out as watery and explosive, #7 on bristol stool scale. Stools are dark but not black. Denies rectal bleeding. She has some gassy and cramping pains, also notes some RUQ pain at times as if she has a GB but had this removed previously. She avoids most processed foods. Notes more stress prior to diarrhea  worsening in August.   She has some bloating and epigastric tenderness as well. Sometimes will wake up in the night and vomit up acid she is taking pepcid '20mg'$  BID. This dose not stop her symptoms but lessens them some. She denies actual heartburn but will cough/feel the need to clear her throat and like she is having acid regurgitating. She does note some nausea and vomiting back in September/October Constantly, this has slowed some but still has some nausea at times, reports approximately 14 pounds by her home scale since August. Denies dysphagia/odynophagia.   Was suggested previously to decrease meloxicam dose to see if this helped with her diarrhea but did not notice a difference. She denies any other changes in meds or antibiotic therapy.   Had negative cologuard last year   NSAID use: no otc NSAIDs, takes meloxicam daily Social hx: no etoh, smokes <10 cigs/day Fam hx: maternal grandmother had CRC in her 22s   Last Colonoscopy: 04/2013 Normal mucosa of terminal ileum. Few small diverticula at sigmoid colon otherwise normal colonoscopy. Random biopsies taken for mucosa of sigmoid colon looking for microscopic or collagenous colitis. Last Endoscopy: maybe around 2011, normal per patient.   Recommendations:    Past Medical History:  Diagnosis Date   Complication of anesthesia    takes a long time to wake up from anesthesia   Depression    HLD (hyperlipidemia) 07/22/2019   Osteoporosis 07/22/2019   PMB (postmenopausal bleeding)  03/11/2016   Post-menopause on HRT (hormone replacement therapy) 03/11/2016   Skin cancer    Recent to anterior chest; cervical cancer   Thickened endometrium 03/16/2016   Vaginal Pap smear, abnormal    Vitamin D deficiency disease 07/22/2019    Past Surgical History:  Procedure Laterality Date   BREAST ENHANCEMENT SURGERY     CERVICAL BIOPSY     precancerous years ago   CHOLECYSTECTOMY  2011   COLONOSCOPY N/A 05/08/2013   Procedure: COLONOSCOPY;  Surgeon:  Rogene Houston, MD;  Location: [facility];  Service: Endoscopy;  Laterality: N/A;  200   HYSTEROSCOPY WITH D & C N/A 10/05/2016   Procedure: HYSTEROSCOPY; UTERINE CURETTAGE;  Surgeon: Florian Buff, MD;  Location: [facility];  Service: Gynecology;  Laterality: N/A;   POLYPECTOMY N/A 10/05/2016   Procedure: REMOVAL OF ENDOMETRIAL POLYP;  Surgeon: Florian Buff, MD;  Location: [facility];  Service: Gynecology;  Laterality: N/A;   TUBAL LIGATION      Current Outpatient Medications  Medication Sig Dispense Refill   Calcium Carbonate (CALTRATE 600 PO) Take by mouth.     DULoxetine (CYMBALTA) 60 MG capsule Take 1 capsule (60 mg total) by mouth daily. 90 capsule 1   famotidine (PEPCID) 20 MG tablet Take 20 mg by mouth 2 (two) times daily.     meloxicam (MOBIC) 7.5 MG tablet SMARTSIG:1 Tablet(s) By Mouth 1-2 Times Daily     prazosin (MINIPRESS) 1 MG capsule Take 1 capsule (1 mg total) by mouth at bedtime. 90 capsule 1   propranolol (INDERAL) 10 MG tablet TAKE (1) TABLET BY MOUTH TWICE A DAY AS NEEDED FOR ANXIETY. 180 tablet 1   simvastatin (ZOCOR) 5 MG tablet Take 5 mg by mouth at bedtime.     dicyclomine (BENTYL) 10 MG capsule Take 10 mg by mouth 3 (three) times daily. (Patient not taking: Reported on 09/27/2022)     loperamide (IMODIUM) 2 MG capsule Take by mouth. (Patient not taking: Reported on 09/27/2022)     No current facility-administered medications for this visit.    Allergies as of 09/27/2022   (No Known Allergies)    Family History  Problem Relation Age of Onset   Dementia Mother    Hyperlipidemia Mother    Thyroid disease Mother    Hypertension Mother    Schizophrenia Father    Alcohol abuse Father        Schizophrenia   Depression Brother    Mental illness Brother        Schizophrenia (2 brothers); schizophrenia and bipolar (1 brother)   Heart attack Brother    Schizophrenia Brother    Kidney failure Brother    Bipolar disorder Brother    Alcohol abuse Maternal Uncle     Alcohol abuse Paternal Uncle    Alzheimer's disease Maternal Grandfather    Cancer Maternal Grandmother        colon   Diabetes Maternal Grandmother    Heart attack Paternal Grandfather    Heart disease Paternal Grandfather    Diabetes Paternal Grandfather    Mental illness Paternal Grandmother    Polycystic ovary syndrome Daughter    Other Daughter        knee replacement   Thyroid disease Daughter        had radiation on thyroid    Social History   Socioeconomic History   Marital status: Married    Spouse name: Not on file   Number of children: Not on file  Years of education: Not on file   Highest education level: Not on file  Occupational History   Not on file  Tobacco Use   Smoking status: Every Day    Packs/day: 0.50    Years: 37.00    Total pack years: 18.50    Types: Cigarettes    Passive exposure: Current   Smokeless tobacco: Never   Tobacco comments:    smokes 5 cig daily  Vaping Use   Vaping Use: Never used  Substance and Sexual Activity   Alcohol use: Not Currently    Comment: Quit September 2019   Drug use: No   Sexual activity: Yes    Birth control/protection: Post-menopausal  Other Topics Concern   Not on file  Social History Narrative   Married for 49 years.Retired,used to work at [employer].   Social Determinants of Health   Financial Resource Strain: Low Risk  (01/09/2020)   Overall Financial Resource Strain (CARDIA)    Difficulty of Paying Living Expenses: Not hard at all  Food Insecurity: No Food Insecurity (01/09/2020)   Hunger Vital Sign    Worried About Running Out of Food in the Last Year: Never true    Ran Out of Food in the Last Year: Never true  Transportation Needs: No Transportation Needs (01/09/2020)   PRAPARE - Hydrologist (Medical): No    Lack of Transportation (Non-Medical): No  Physical Activity: Sufficiently Active (01/09/2020)   Exercise Vital Sign    Days of Exercise per Week: 5  days    Minutes of Exercise per Session: 30 min  Stress: No Stress Concern Present (01/09/2020)   [place]    Feeling of Stress : Only a little  Social Connections: Socially Integrated (01/09/2020)   Social Connection and Isolation Panel [NHANES]    Frequency of Communication with Friends and Family: Once a week    Frequency of Social Gatherings with Friends and Family: More than three times a week    Attends Religious Services: More than 4 times per year    Active Member of [employer] or Organizations: Yes    Attends Music therapist: More than 4 times per year    Marital Status: Married   Review of systems General: negative for malaise, night sweats, fever, chills, weight loss Neck: Negative for lumps, goiter, pain and significant neck swelling Resp: Negative for cough, wheezing, dyspnea at rest CV: Negative for chest pain, leg swelling, palpitations, orthopnea GI: denies melena, hematochezia, constipation, dysphagia, odyonophagia, early satiety +nausea +vomiting +acid regurgitation +weight loss +diarrhea +epigastric pain +abdominal cramping  MSK: Negative for joint pain or swelling, back pain, and muscle pain. Derm: Negative for itching or rash Psych: Denies depression, anxiety, memory loss, confusion. No homicidal or suicidal ideation.  Heme: Negative for prolonged bleeding, bruising easily, and swollen nodes. Endocrine: Negative for cold or heat intolerance, polyuria, polydipsia and goiter. Neuro: negative for tremor, gait imbalance, syncope and seizures. The remainder of the review of systems is noncontributory.  Physical Exam: BP 121/81 (BP Location: Left Arm, Patient Position: Sitting, Cuff Size: Normal)   Pulse 84   Temp 98 F (36.7 C) (Oral)   Ht '5\' 2"'$  (1.575 m)   Wt 117 lb (53.1 kg)   BMI 21.40 kg/m  General:   Alert and oriented. No distress noted. Pleasant and cooperative.  Head:   Normocephalic and atraumatic. Eyes:  Conjuctiva clear without scleral icterus. Mouth:  Oral  mucosa pink and moist. Good dentition. No lesions. Heart: Normal rate and rhythm, s1 and s2 heart sounds present.  Lungs: Clear lung sounds in all lobes. Respirations equal and unlabored. Abdomen:  +BS, soft, and non-distended. TTP of epigastric region. No rebound or guarding. No HSM or masses noted. Derm: No palmar erythema or jaundice Msk:  Symmetrical without gross deformities. Normal posture. Extremities:  Without edema. Neurologic:  Alert and  oriented x4 Psych:  Alert and cooperative. Normal mood and affect.  Invalid input(s): "6 MONTHS"   ASSESSMENT: ALAIRA LEVEL is a 71 y.o. female presenting today as a new patient for diarrhea, uncontrolled GERD, nausea/vomiting.  Diarrhea/history of IBS-D:Patient with previous evaluation for diarrhea in 2014 with colonoscopy, negative random colonic biopsies and negative celiac panel, diarrhea thought heavily influenced by stress. Started having frequent diarrhea again in August, though does not significant increase in stress prior to this. Denies rectal bleeding or melena. Bentyl helped to slow diarrhea some previously, she cannot tolerate imodium. Has some gas pain/cramping at times, eating heavier meals causes more diarrhea. Given chronicity of her symptoms, infectious etiology less likely though will rule out with GI path panel, will check pancreatic fecal elastase and fecal fat as well. Suspect diarrhea is secondary to her IBS with increase in stress level likely influencing this, there could be some aspect of BAD. I discussed updating colonoscopy early for further evaluation, however, she prefers to hold off on this at this time. If stool studies are unrevealing, may need to further discuss repeating colonoscopy.   GERD/Nausea/Vomiting: history of osteoporosis/osteopenia on recent DEXA, has been maintained on pepcid '20mg'$  BID. Having a cough at times as  well as acid regurgitation with nausea and vomiting, as well as bloating and epigastric pain/tenderness. Is on meloxicam daily so certainly at risk for PUD/gastritis. Given hx of osteopenia, would like to avoid PPI therapy if possible, will increase pepcid to '40mg'$  BID, needs to implement strict reflux precautions. Recommend EGD for further evaluation of her symptoms, can take small bowel biopsies at that time as well in regards to diarrhea, as above. Indications, risks and benefits of procedure discussed in detail with patient. Patient verbalized understanding and is in agreement to proceed with EGD.   PLAN:  GI pathogen panel 2.  Increase pepcid to '40mg'$  BID, Rx sent 3. EGD w/SB biopsies ASA III ENDO 3, consider colonoscopy if stool studies unrevealing 4. Pancreatic elastase, fecal fat  5. May Consider trial of Bile acid sequestrant in the future  6. Low FODMAP food guide 7. Strict reflux precautions  8. Stress management important   All questions were answered, patient verbalized understanding and is in agreement with plan as outlined above.   Follow Up: 3 months   Morgan Fields L. Alver Sorrow, MSN, APRN, AGNP-C Adult-Gerontology Nurse Practitioner [facility]  I have reviewed the note and agree with the APP's assessment as described in this progress note  Maylon Peppers, MD Gastroenterology and Hepatology [facility]

## 2022-09-28 ENCOUNTER — Telehealth: Payer: Self-pay | Admitting: *Deleted

## 2022-09-28 DIAGNOSIS — R1084 Generalized abdominal pain: Secondary | ICD-10-CM | POA: Insufficient documentation

## 2022-09-28 DIAGNOSIS — R112 Nausea with vomiting, unspecified: Secondary | ICD-10-CM | POA: Insufficient documentation

## 2022-09-28 DIAGNOSIS — K219 Gastro-esophageal reflux disease without esophagitis: Secondary | ICD-10-CM | POA: Insufficient documentation

## 2022-09-28 NOTE — Telephone Encounter (Signed)
CALLED PT, SPOKE WTH SPOUSE. He is not with her at the moment but will have pt call us back to schedule EGD w/ small bowel biopsies, asa 3 with Dr. Jenetta Downer

## 2022-09-28 NOTE — Telephone Encounter (Signed)
Received VM from pt to call her back at [phone]. When trying to call back I receive fast busy signal x 3

## 2022-09-29 ENCOUNTER — Encounter: Payer: Self-pay | Admitting: *Deleted

## 2022-09-29 NOTE — Telephone Encounter (Signed)
Pt has been scheduled for 11/08/22 at 10:15 am with Dr.Castaneda. Instructions mailed to pt

## 2022-09-30 NOTE — Addendum Note (Signed)
Addended by: Harvel Quale on: 09/30/2022 02:54 PM   Modules accepted: Level of Service

## 2022-10-06 ENCOUNTER — Ambulatory Visit (HOSPITAL_COMMUNITY): Payer: Medicare Other | Admitting: Clinical

## 2022-10-06 LAB — GASTROINTESTINAL PATHOGEN PNL
CampyloBacter Group: NOT DETECTED
Norovirus GI/GII: NOT DETECTED
Rotavirus A: NOT DETECTED
Salmonella species: NOT DETECTED
Shiga Toxin 1: NOT DETECTED
Shiga Toxin 2: NOT DETECTED
Shigella Species: NOT DETECTED
Vibrio Group: NOT DETECTED
Yersinia enterocolitica: NOT DETECTED

## 2022-10-06 LAB — FECAL FAT, QUALITATIVE: FECAL FAT, QUALITATIVE: NORMAL

## 2022-10-06 LAB — PANCREATIC ELASTASE, FECAL: Pancreatic Elastase-1, Stool: >500 mcg/g

## 2022-10-10 ENCOUNTER — Other Ambulatory Visit (INDEPENDENT_AMBULATORY_CARE_PROVIDER_SITE_OTHER): Payer: Self-pay | Admitting: Gastroenterology

## 2022-10-10 MED ORDER — CHOLESTYRAMINE 4 G PO PACK: 4.0000 g | PACK | Freq: Two times a day (BID) | ORAL | 2 refills | Status: DC

## 2022-10-10 NOTE — Progress Notes (Signed)
Gabriel Rung, NP  Carmelina Noun, LPN; Worthy Keeler Questran 4g BID has been sent.  Ann, can we send referral to nutritionist, diagnosis: diarrhea, suspect IBS/BAD.  Thanks!       Left message to return call to notify patient.

## 2022-10-27 ENCOUNTER — Encounter: Payer: Medicare Other | Attending: Adult Health | Admitting: Nutrition

## 2022-10-27 VITALS — Ht 62.0 in | Wt 116.0 lb

## 2022-10-27 DIAGNOSIS — K580 Irritable bowel syndrome with diarrhea: Secondary | ICD-10-CM | POA: Insufficient documentation

## 2022-10-27 DIAGNOSIS — R634 Abnormal weight loss: Secondary | ICD-10-CM | POA: Insufficient documentation

## 2022-10-27 NOTE — Progress Notes (Signed)
Medical Nutrition Therapy  Appointment Start time:  [id number]  Appointment End time:  [id number]  Primary concerns today:  IBS and wt loss Referral diagnosis: K58, R63.4 Preferred learning style: Ready, verbal Learning readiness: Ready    NUTRITION ASSESSMENT  72 yr old wfemale her for undesired weight loss and IBS. PMH Hyperlipidemia and PTSD. She notes her PTSD effects her IBS a lot. Sees a counselor for her PTSD. Lost 9 lbs.  Wants to work on eating healthier to help her IBS and not keep losing weight.  She is willing to work on lifestyle medicine to improve her health and help improve her IBS. Was told to follow  a FODMAP diet but doesn't understanding what to eat.  Current diet is insuffient to meet her nutritional needs and contributing to her weight loss.  Anthropometrics  Wt Readings from Last 3 Encounters:  09/27/22 117 lb (53.1 kg)  01/21/21 126 lb 6.4 oz (57.3 kg)  01/06/21 127 lb (57.6 kg)   Ht Readings from Last 3 Encounters:  09/27/22 '5\' 2"'$  (1.575 m)  01/21/21 '5\' 2"'$  (1.575 m)  01/06/21 5' (1.524 m)   There is no height or weight on file to calculate BMI. '@BMIFA'$ @ Facility age limit for growth %iles is 20 years. Facility age limit for growth %iles is 20 years.    Clinical Medical Hx: IBS, Medications: see chart Labs: No results found for: "HGBA1C"    Latest Ref Rng & Units 03/07/2022    8:55 AM 05/28/2020   11:07 AM 01/07/2020   10:53 AM  CMP  Glucose 65 - 99 mg/dL  88  85   BUN 7 - 25 mg/dL  13  13   Creatinine 0.50 - 0.99 mg/dL  0.93  0.81   Sodium 134 - 144 mmol/L 140  137  142   Potassium 3.5 - 5.3 mmol/L  4.3  4.3   Chloride 98 - 110 mmol/L  105  108   CO2 20 - 32 mmol/L  23  27   Calcium 8.6 - 10.4 mg/dL  9.4  9.5   Total Protein 6.0 - 8.5 g/dL 6.8  7.0  6.7   Total Bilirubin 0.0 - 1.2 mg/dL 0.4  0.4  0.4   Alkaline Phos 44 - 121 IU/L 62     AST 0 - 40 IU/L '18  16  13   '$ ALT 0 - 32 IU/L '12  10  8    '$ Lipid Panel     Component Value Date/Time   CHOL 239  (H) 01/21/2021 1018   TRIG 86 01/21/2021 1018   HDL 65 01/21/2021 1018   CHOLHDL 3.7 01/21/2021 1018   LDLCALC 155 (H) 01/21/2021 1018    Notable Signs/Symptoms: Diarrhea,   Lifestyle & Dietary Hx LIves with her husband   Estimated daily fluid intake: 80 oz Supplements: MVI Sleep: 7 Stress / self-care: PTSD Current average weekly physical activity: ADL  24-Hr Dietary Recall First Meal: 7 am 1/4 c coffee black-caffeine;, 10 oz water Snack: 1/2 c coffee 10 30 1/4 c yogurt- orko's non fat plain, walnuts, blueberries, molasses, 10 oz water Second Meal: 1130 10 gluten free crackers,  Snack:  Third Meal: 1/2 bowl old fashion oatmeal with non fat yogurt, 20 oz water 4 pm; apple without peeling 415 baby bell cheese 430 1/2 c coffee 515 : sardines, 10 saltines crackers 6 pm 1/2 beef stew [place], with rice 830 pm 4 crackers with soft cheese,  Beverages: water   Went to bathroom 6  times this am  Estimated Energy Needs Calories: 1800 Carbohydrate: 200g Protein: 135g Fat: 50g   NUTRITION DIAGNOSIS  [place]-1.4 Altered GI function As related to eating foods not tolerated by GI and PTSD effect on intestines.  As evidenced by multiple stools 5-6 times per day and having IBS.Marland Kitchen   NUTRITION INTERVENTION  Nutrition education (E-1) on the following topics:  IBS Nutrition Therapy Whole plant based lifesyle   Handouts Provided Include  IBS nutrition therapy Plate method  Learning Style & Readiness for Change Teaching method utilized: Visual & Auditory  Demonstrated degree of understanding via: Teach Back  Barriers to learning/adherence to lifestyle change: emotional stress/trauma  Goals Established by Pt Goals  Keep a food journal and symptoms Buy foods that will be allowed on the FODMAP Eat small frequent meals of safe foods Keep working with therapist on self care.   MONITORING & EVALUATION Dietary intake, weekly physical activity, and weight  in 1 month.  Next  Steps  Patient is to work on following [place].

## 2022-10-27 NOTE — Patient Instructions (Signed)
Goals  Keep a food journal and symptoms Buy foods that will be allowed on the FODMAP Eat small frequent meals of safe foods Keep working with therapist on self care.

## 2022-11-02 NOTE — Patient Instructions (Addendum)
Morgan Fields  11/02/2022     @PREFPERIOPPHARMACY$ @   Your procedure is scheduled on  11/08/2022.   Report to [facility] at  0800 A.M.   Call this number if you have problems the morning of surgery:  [phone]  If you experience any cold or flu symptoms such as cough, fever, chills, shortness of breath, etc. between now and your scheduled surgery, please notify Morgan Fields at the above number.   Remember:  Follow the diet instructions given to you by the office.     Take these medicines the morning of surgery with A SIP OF WATER              cymbalta, pepcid, mobic(if needed), inderal.     Do not wear jewelry, make-up or nail polish.  Do not wear lotions, powders, or perfumes, or deodorant.  Do not shave 48 hours prior to surgery.  Men may shave face and neck.  Do not bring valuables to the hospital.  [facility] is not responsible for any belongings or valuables.  Contacts, dentures or bridgework may not be worn into surgery.  Leave your suitcase in the car.  After surgery it may be brought to your room.  For patients admitted to the hospital, discharge time will be determined by your treatment team.  Patients discharged the day of surgery will not be allowed to drive home and must have someone with them for 24 hours.    Special instructions:   DO NOT smoke tobacco or vape for 24 hours before your procedure.  Please read over the following fact sheets that you were given. Anesthesia Post-op Instructions and Care and Recovery After Surgery       Upper Endoscopy, Adult, Care After After the procedure, it is common to have a sore throat. It is also common to have: Mild stomach pain or discomfort. Bloating. Nausea. Follow these instructions at home: The instructions below may help you care for yourself at home. Your health care provider may give you more instructions. If you have questions, ask your health care provider. If you were given a sedative during the  procedure, it can affect you for several hours. Do not drive or operate machinery until your health care provider says that it is safe. If you will be going home right after the procedure, plan to have a responsible adult: Take you home from the hospital or clinic. You will not be allowed to drive. Care for you for the time you are told. Follow instructions from your health care provider about what you may eat and drink. Return to your normal activities as told by your health care provider. Ask your health care provider what activities are safe for you. Take over-the-counter and prescription medicines only as told by your health care provider. Contact a health care provider if you: Have a sore throat that lasts longer than one day. Have trouble swallowing. Have a fever. Get help right away if you: Vomit blood or your vomit looks like coffee grounds. Have bloody, black, or tarry stools. Have a very bad sore throat or you cannot swallow. Have difficulty breathing or very bad pain in your chest or abdomen. These symptoms may be an emergency. Get help right away. Call 911. Do not wait to see if the symptoms will go away. Do not drive yourself to the hospital. Summary After the procedure, it is common to have a sore throat, mild stomach discomfort, bloating, and nausea. If  you were given a sedative during the procedure, it can affect you for several hours. Do not drive until your health care provider says that it is safe. Follow instructions from your health care provider about what you may eat and drink. Return to your normal activities as told by your health care provider. This information is not intended to replace advice given to you by your health care provider. Make sure you discuss any questions you have with your health care provider. Document Revised: 12/15/2021 Document Reviewed: 12/15/2021 Elsevier Patient Education  [place] After The  following information offers guidance on how to care for yourself after your procedure. Your health care provider may also give you more specific instructions. If you have problems or questions, contact your health care provider. What can I expect after the procedure? After the procedure, it is common to have: Tiredness. Little or no memory about what happened during or after the procedure. Impaired judgment when it comes to making decisions. Nausea or vomiting. Some trouble with balance. Follow these instructions at home: For the time period you were told by your health care provider:  Rest. Do not participate in activities where you could fall or become injured. Do not drive or use machinery. Do not drink alcohol. Do not take sleeping pills or medicines that cause drowsiness. Do not make important decisions or sign legal documents. Do not take care of children on your own. Medicines Take over-the-counter and prescription medicines only as told by your health care provider. If you were prescribed antibiotics, take them as told by your health care provider. Do not stop using the antibiotic even if you start to feel better. Eating and drinking Follow instructions from your health care provider about what you may eat and drink. Drink enough fluid to keep your urine pale yellow. If you vomit: Drink clear fluids slowly and in small amounts as you are able. Clear fluids include water, ice chips, low-calorie sports drinks, and fruit juice that has water added to it (diluted fruit juice). Eat light and bland foods in small amounts as you are able. These foods include bananas, applesauce, rice, lean meats, toast, and crackers. General instructions  Have a responsible adult stay with you for the time you are told. It is important to have someone help care for you until you are awake and alert. If you have sleep apnea, surgery and some medicines can increase your risk for breathing problems.  Follow instructions from your health care provider about wearing your sleep device: When you are sleeping. This includes during daytime naps. While taking prescription pain medicines, sleeping medicines, or medicines that make you drowsy. Do not use any products that contain nicotine or tobacco. These products include cigarettes, chewing tobacco, and vaping devices, such as e-cigarettes. If you need help quitting, ask your health care provider. Contact a health care provider if: You feel nauseous or vomit every time you eat or drink. You feel light-headed. You are still sleepy or having trouble with balance after 24 hours. You get a rash. You have a fever. You have redness or swelling around the IV site. Get help right away if: You have trouble breathing. You have new confusion after you get home. These symptoms may be an emergency. Get help right away. Call 911. Do not wait to see if the symptoms will go away. Do not drive yourself to the hospital. This information is not intended to replace advice given to you by your  health care provider. Make sure you discuss any questions you have with your health care provider. Document Revised: 01/31/2022 Document Reviewed: 01/31/2022 Elsevier Patient Education  [place].

## 2022-11-03 ENCOUNTER — Other Ambulatory Visit: Payer: Self-pay

## 2022-11-03 ENCOUNTER — Encounter (HOSPITAL_COMMUNITY): Payer: Self-pay

## 2022-11-03 ENCOUNTER — Encounter (HOSPITAL_COMMUNITY)
Admission: RE | Admit: 2022-11-03 | Discharge: 2022-11-03 | Disposition: A | Discharge status: Home / Self Care | Payer: Medicare Other | Source: Ambulatory Visit | Attending: Gastroenterology | Admitting: Gastroenterology

## 2022-11-03 VITALS — BP 106/54 | HR 80 | Temp 97.7°F | Resp 18 | Ht 62.0 in | Wt 116.0 lb

## 2022-11-03 DIAGNOSIS — Z01810 Encounter for preprocedural cardiovascular examination: Secondary | ICD-10-CM | POA: Diagnosis present

## 2022-11-03 DIAGNOSIS — F17200 Nicotine dependence, unspecified, uncomplicated: Secondary | ICD-10-CM | POA: Diagnosis not present

## 2022-11-08 ENCOUNTER — Other Ambulatory Visit: Payer: Self-pay

## 2022-11-08 ENCOUNTER — Encounter (HOSPITAL_COMMUNITY)
Admission: RE | Disposition: A | Discharge status: Home / Self Care | Payer: Self-pay | Source: Home / Self Care | Attending: Gastroenterology

## 2022-11-08 ENCOUNTER — Ambulatory Visit (HOSPITAL_BASED_OUTPATIENT_CLINIC_OR_DEPARTMENT_OTHER): Payer: Medicare Other | Admitting: Anesthesiology

## 2022-11-08 ENCOUNTER — Encounter (HOSPITAL_COMMUNITY): Payer: Self-pay | Admitting: Gastroenterology

## 2022-11-08 ENCOUNTER — Ambulatory Visit (HOSPITAL_COMMUNITY): Payer: Medicare Other | Admitting: Anesthesiology

## 2022-11-08 ENCOUNTER — Ambulatory Visit (HOSPITAL_COMMUNITY)
Admission: RE | Admit: 2022-11-08 | Discharge: 2022-11-08 | Disposition: A | Discharge status: Home / Self Care | Payer: Medicare Other | Attending: Gastroenterology | Admitting: Gastroenterology

## 2022-11-08 DIAGNOSIS — K219 Gastro-esophageal reflux disease without esophagitis: Secondary | ICD-10-CM | POA: Insufficient documentation

## 2022-11-08 DIAGNOSIS — K449 Diaphragmatic hernia without obstruction or gangrene: Secondary | ICD-10-CM | POA: Insufficient documentation

## 2022-11-08 DIAGNOSIS — R197 Diarrhea, unspecified: Secondary | ICD-10-CM | POA: Diagnosis not present

## 2022-11-08 DIAGNOSIS — F419 Anxiety disorder, unspecified: Secondary | ICD-10-CM | POA: Diagnosis not present

## 2022-11-08 DIAGNOSIS — Z8349 Family history of other endocrine, nutritional and metabolic diseases: Secondary | ICD-10-CM | POA: Diagnosis not present

## 2022-11-08 DIAGNOSIS — R112 Nausea with vomiting, unspecified: Secondary | ICD-10-CM | POA: Diagnosis not present

## 2022-11-08 DIAGNOSIS — K3189 Other diseases of stomach and duodenum: Secondary | ICD-10-CM | POA: Insufficient documentation

## 2022-11-08 DIAGNOSIS — Z79899 Other long term (current) drug therapy: Secondary | ICD-10-CM | POA: Diagnosis not present

## 2022-11-08 DIAGNOSIS — M810 Age-related osteoporosis without current pathological fracture: Secondary | ICD-10-CM | POA: Diagnosis not present

## 2022-11-08 DIAGNOSIS — F17200 Nicotine dependence, unspecified, uncomplicated: Secondary | ICD-10-CM

## 2022-11-08 DIAGNOSIS — F32A Depression, unspecified: Secondary | ICD-10-CM | POA: Insufficient documentation

## 2022-11-08 DIAGNOSIS — E785 Hyperlipidemia, unspecified: Secondary | ICD-10-CM | POA: Insufficient documentation

## 2022-11-08 DIAGNOSIS — E559 Vitamin D deficiency, unspecified: Secondary | ICD-10-CM | POA: Diagnosis not present

## 2022-11-08 DIAGNOSIS — F17210 Nicotine dependence, cigarettes, uncomplicated: Secondary | ICD-10-CM | POA: Insufficient documentation

## 2022-11-08 HISTORY — PX: BIOPSY: SHX5522

## 2022-11-08 HISTORY — PX: ESOPHAGOGASTRODUODENOSCOPY (EGD) WITH PROPOFOL: SHX5813

## 2022-11-08 SURGERY — ESOPHAGOGASTRODUODENOSCOPY (EGD) WITH PROPOFOL
Anesthesia: General

## 2022-11-08 MED ORDER — LACTATED RINGERS IV SOLN
INTRAVENOUS | Status: DC
  Administered 2022-11-08: 1000 mL via INTRAVENOUS

## 2022-11-08 MED ORDER — PROPOFOL 10 MG/ML IV BOLUS
INTRAVENOUS | Status: DC | PRN
  Administered 2022-11-08: 50 mg via INTRAVENOUS

## 2022-11-08 MED ORDER — LIDOCAINE 2% (20 MG/ML) 5 ML SYRINGE
INTRAMUSCULAR | Status: DC | PRN
  Administered 2022-11-08: 50 mg via INTRAVENOUS

## 2022-11-08 MED ORDER — PROPOFOL 500 MG/50ML IV EMUL
INTRAVENOUS | Status: DC | PRN
  Administered 2022-11-08: 175 ug/kg/min via INTRAVENOUS

## 2022-11-08 MED ORDER — CHOLESTYRAMINE 4 G PO PACK: 8.0000 g | PACK | Freq: Two times a day (BID) | ORAL | 2 refills | Status: DC

## 2022-11-08 NOTE — H&P (Signed)
Morgan Fields is an 72 y.o. female.   Chief Complaint: nausea and regurgitation, diarrhea HPI: 72 y/o female with past medical history of depression, HLD, osteoporosi, vitamin d deficiency, coming for evaluation of nausea and regurgitation, diarrhea  Patient reported after starting Pepcid twice daily her symptoms have improved and she has occasional burning in her epigastric area and mild burping.  Still having some diarrhea despite taking cholestyramine twice daily.  Past Medical History:  Diagnosis Date   Complication of anesthesia    takes a long time to wake up from anesthesia   Depression    HLD (hyperlipidemia) 07/22/2019   Osteoporosis 07/22/2019   PMB (postmenopausal bleeding) 03/11/2016   Post-menopause on HRT (hormone replacement therapy) 03/11/2016   Skin cancer    Recent to anterior chest; cervical cancer   Thickened endometrium 03/16/2016   Vaginal Pap smear, abnormal    Vitamin D deficiency disease 07/22/2019    Past Surgical History:  Procedure Laterality Date   BREAST ENHANCEMENT SURGERY     CERVICAL BIOPSY     precancerous years ago   CHOLECYSTECTOMY  2011   COLONOSCOPY N/A 05/08/2013   Procedure: COLONOSCOPY;  Surgeon: Rogene Houston, MD;  Location: [facility];  Service: Endoscopy;  Laterality: N/A;  200   HYSTEROSCOPY WITH D & C N/A 10/05/2016   Procedure: HYSTEROSCOPY; UTERINE CURETTAGE;  Surgeon: Florian Buff, MD;  Location: [facility];  Service: Gynecology;  Laterality: N/A;   POLYPECTOMY N/A 10/05/2016   Procedure: REMOVAL OF ENDOMETRIAL POLYP;  Surgeon: Florian Buff, MD;  Location: [facility];  Service: Gynecology;  Laterality: N/A;   TUBAL LIGATION      Family History  Problem Relation Age of Onset   Dementia Mother    Hyperlipidemia Mother    Thyroid disease Mother    Hypertension Mother    Schizophrenia Father    Alcohol abuse Father        Schizophrenia   Depression Brother    Mental illness Brother        Schizophrenia (2 brothers); schizophrenia  and bipolar (1 brother)   Heart attack Brother    Schizophrenia Brother    Kidney failure Brother    Bipolar disorder Brother    Alcohol abuse Maternal Uncle    Alcohol abuse Paternal Uncle    Alzheimer's disease Maternal Grandfather    Cancer Maternal Grandmother        colon   Diabetes Maternal Grandmother    Heart attack Paternal Grandfather    Heart disease Paternal Grandfather    Diabetes Paternal Grandfather    Mental illness Paternal Grandmother    Polycystic ovary syndrome Daughter    Other Daughter        knee replacement   Thyroid disease Daughter        had radiation on thyroid   Social History:  reports that she has been smoking cigarettes. She has a 18.50 pack-year smoking history. She has been exposed to tobacco smoke. She has never used smokeless tobacco. She reports that she does not currently use alcohol. She reports that she does not use drugs.  Allergies: No Known Allergies  Medications Prior to Admission  Medication Sig Dispense Refill   calcium-vitamin D (OSCAL WITH D) 500-5 MG-MCG tablet Take 1 tablet by mouth daily with breakfast.     cholestyramine (QUESTRAN) 4 g packet Take 1 packet (4 g total) by mouth 2 (two) times daily. 60 each 2   DULoxetine (CYMBALTA) 60 MG capsule Take 1  capsule (60 mg total) by mouth daily. 90 capsule 1   famotidine (PEPCID) 40 MG tablet Take 1 tablet (40 mg total) by mouth 2 (two) times daily. 60 tablet 3   meloxicam (MOBIC) 7.5 MG tablet Take 7.5 mg by mouth 2 (two) times daily as needed for pain.     Multiple Vitamin (MULTIVITAMIN WITH MINERALS) TABS tablet Take 1 tablet by mouth daily.     prazosin (MINIPRESS) 1 MG capsule Take 1 capsule (1 mg total) by mouth at bedtime. 90 capsule 1   propranolol (INDERAL) 10 MG tablet TAKE (1) TABLET BY MOUTH TWICE A DAY AS NEEDED FOR ANXIETY. 180 tablet 1   simvastatin (ZOCOR) 5 MG tablet Take 5 mg by mouth at bedtime.      No results found for this or any previous visit (from the past 48  hour(s)). No results found.  Review of Systems  Constitutional: Negative.   HENT: Negative.    Eyes: Negative.   Respiratory: Negative.    Cardiovascular: Negative.   Gastrointestinal:  Positive for diarrhea and nausea.  Endocrine: Negative.   Genitourinary: Negative.   Musculoskeletal: Negative.   Skin: Negative.   Allergic/Immunologic: Negative.   Neurological: Negative.   Hematological: Negative.   Psychiatric/Behavioral: Negative.      Blood pressure 109/74, pulse 70, temperature 98 F (36.7 C), temperature source Oral, resp. rate 13, weight 52.6 kg, SpO2 99 %. Physical Exam  GENERAL: The patient is AO x3, in no acute distress. HEENT: Head is normocephalic and atraumatic. EOMI are intact. Mouth is well hydrated and without lesions. NECK: Supple. No masses LUNGS: Clear to auscultation. No presence of rhonchi/wheezing/rales. Adequate chest expansion HEART: RRR, normal s1 and s2. ABDOMEN: Soft, nontender, no guarding, no peritoneal signs, and nondistended. BS +. No masses. EXTREMITIES: Without any cyanosis, clubbing, rash, lesions or edema. NEUROLOGIC: AOx3, no focal motor deficit. SKIN: no jaundice, no rashes  Assessment/Plan  72 y/o female with past medical history of depression, HLD, osteoporosi, vitamin d deficiency, coming for evaluation of nausea and regurgitation, diarrhea.  Will proceed with EGD.  Harvel Quale, MD 11/08/2022, 9:55 AM

## 2022-11-08 NOTE — Anesthesia Postprocedure Evaluation (Signed)
Anesthesia Post Note  Patient: Morgan Fields  Procedure(s) Performed: ESOPHAGOGASTRODUODENOSCOPY (EGD) WITH PROPOFOL BIOPSY  Patient location during evaluation: Phase II Anesthesia Type: General Level of consciousness: awake and alert and oriented Pain management: pain level controlled Vital Signs Assessment: post-procedure vital signs reviewed and stable Respiratory status: spontaneous breathing, nonlabored ventilation and respiratory function stable Cardiovascular status: blood pressure returned to baseline and stable Postop Assessment: no apparent nausea or vomiting Anesthetic complications: no  No notable events documented.   Last Vitals:  Vitals:   11/08/22 1016 11/08/22 1020  BP: 109/63   Pulse: 74   Resp: 18 18  Temp: 36.9 C   SpO2: 99% 99%    Last Pain:  Vitals:   11/08/22 1020  TempSrc: Oral  PainSc:                  Alberta Lenhard C Rondy Krupinski

## 2022-11-08 NOTE — Transfer of Care (Signed)
Immediate Anesthesia Transfer of Care Note  Patient: Morgan Fields  Procedure(s) Performed: ESOPHAGOGASTRODUODENOSCOPY (EGD) WITH PROPOFOL BIOPSY  Patient Location: PACU  Anesthesia Type:General  Level of Consciousness: awake, alert , and oriented  Airway & Oxygen Therapy: Patient Spontanous Breathing  Post-op Assessment: Report given to RN and Post -op Vital signs reviewed and stable  Post vital signs: Reviewed and stable  Last Vitals:  Vitals Value Taken Time  BP    Temp    Pulse    Resp    SpO2      Last Pain:  Vitals:   11/08/22 0959  TempSrc:   PainSc: 0-No pain      Patients Stated Pain Goal: 5 ([phone] [id number])  Complications: No notable events documented.

## 2022-11-08 NOTE — Discharge Instructions (Signed)
You are being discharged to home.  Resume your previous diet.  Continue your present medications.  Increase cholestyramine to 2 packets (8 g) twice a day. if persistent diarrhea, proceed with colonoscopy Read about combo Transoral Incisionless Fundoplication (cTIF). Pamphlet provided.

## 2022-11-08 NOTE — Op Note (Signed)
[facility] Patient Name: Morgan Fields Procedure Date: 11/08/2022 9:40 AM MRN: [id number] Date of Birth: 12/06/1950 Attending MD: Maylon Peppers , , [id number] CSN: [id number] Age: 72 Admit Type: Outpatient Procedure:                Upper GI endoscopy Indications:              Follow-up of gastro-esophageal reflux disease,                            Nausea with vomiting Providers:                Maylon Peppers, Caprice Kluver, Kalama Page,                            Kristine L. Risa Grill, Technician Referring MD:              Medicines:                Monitored Anesthesia Care Complications:            No immediate complications. Estimated Blood Loss:     Estimated blood loss: none. Procedure:                Pre-Anesthesia Assessment:                           - Prior to the procedure, a History and Physical                            was performed, and patient medications, allergies                            and sensitivities were reviewed. The patient's                            tolerance of previous anesthesia was reviewed.                           - The risks and benefits of the procedure and the                            sedation options and risks were discussed with the                            patient. All questions were answered and informed                            consent was obtained.                           - ASA Grade Assessment: II - A patient with mild                            systemic disease.                           After obtaining informed consent, the endoscope was  passed under direct vision. Throughout the                            procedure, the patient's blood pressure, pulse, and                            oxygen saturations were monitored continuously. The                            [id number] scope was introduced through the                            mouth, and advanced to the second part of  duodenum.                            The upper GI endoscopy was accomplished without                            difficulty. The patient tolerated the procedure                            well. Scope In: 10:03:21 AM Scope Out: 10:09:00 AM Total Procedure Duration: 0 hours 5 minutes 39 seconds  Findings:      A 3 cm hiatal hernia was present.      The gastroesophageal flap valve was visualized endoscopically and       classified as Hill Grade III (minimal fold, loose to endoscope, hiatal       hernia likely).      A few localized medium erosions with no stigmata of recent bleeding were       found in the gastric antrum. Biopsies were taken with a cold forceps for       Helicobacter pylori testing.      The examined duodenum was normal. Impression:               - 3 cm hiatal hernia.                           - Erosive gastropathy with no stigmata of recent                            bleeding. Biopsied.                           - Normal examined duodenum. Moderate Sedation:      Per Anesthesia Care Recommendation:           - Discharge patient to home (ambulatory).                           - Resume previous diet.                           - Continue present medications.                           - Increase cholestyramine to 2 packets (8 g) twice  a day. if persistent diarrhea, proceed with                            colonoscopy                           - Read about combo Transoral Incisionless                            Fundoplication (cTIF). Pamphlet provided. Procedure Code(s):        --- Professional ---                           [phone], Esophagogastroduodenoscopy, flexible,                            transoral; with biopsy, single or multiple Diagnosis Code(s):        --- Professional ---                           K44.9, Diaphragmatic hernia without obstruction or                            gangrene                           K31.89, Other diseases of  stomach and duodenum                           K21.9, Gastro-esophageal reflux disease without                            esophagitis                           R11.2, Nausea with vomiting, unspecified CPT copyright 2022 American Medical Association. All rights reserved. The codes documented in this report are preliminary and upon coder review may  be revised to meet current compliance requirements. Maylon Peppers, MD Maylon Peppers,  11/08/2022 10:22:16 AM This report has been signed electronically. Number of Addenda: 0

## 2022-11-08 NOTE — Anesthesia Preprocedure Evaluation (Signed)
Anesthesia Evaluation  Patient identified by MRN, date of birth, ID band Patient awake    Reviewed: Allergy & Precautions, H&P , NPO status , Patient's Chart, lab work & pertinent test results, reviewed documented beta blocker date and time   History of Anesthesia Complications (+) PROLONGED EMERGENCE and history of anesthetic complications  Airway Mallampati: II  TM Distance: >3 FB Neck ROM: Full    Dental  (+) Dental Advisory Given, Chipped,    Pulmonary Current Smoker and Patient abstained from smoking.   Pulmonary exam normal breath sounds clear to auscultation       Cardiovascular negative cardio ROS Normal cardiovascular exam Rhythm:Regular Rate:Normal     Neuro/Psych  PSYCHIATRIC DISORDERS Anxiety Depression    negative neurological ROS     GI/Hepatic Neg liver ROS,GERD (nausea/vomiting)  Medicated,,  Endo/Other  negative endocrine ROS    Renal/GU negative Renal ROS  negative genitourinary   Musculoskeletal negative musculoskeletal ROS (+)    Abdominal   Peds negative pediatric ROS (+)  Hematology negative hematology ROS (+)   Anesthesia Other Findings   Reproductive/Obstetrics negative OB ROS                             Anesthesia Physical Anesthesia Plan  ASA: 2  Anesthesia Plan: General   Post-op Pain Management: Minimal or no pain anticipated   Induction: Intravenous  PONV Risk Score and Plan: Propofol infusion  Airway Management Planned: Nasal Cannula and Natural Airway  Additional Equipment:   Intra-op Plan:   Post-operative Plan:   Informed Consent: I have reviewed the patients History and Physical, chart, labs and discussed the procedure including the risks, benefits and alternatives for the proposed anesthesia with the patient or authorized representative who has indicated his/her understanding and acceptance.     Dental advisory given  Plan Discussed  with: CRNA and Surgeon  Anesthesia Plan Comments:         Anesthesia Quick Evaluation

## 2022-11-09 LAB — SURGICAL PATHOLOGY

## 2022-11-14 ENCOUNTER — Encounter (HOSPITAL_COMMUNITY): Payer: Self-pay | Admitting: Gastroenterology

## 2022-11-15 ENCOUNTER — Telehealth (INDEPENDENT_AMBULATORY_CARE_PROVIDER_SITE_OTHER): Payer: Self-pay | Admitting: *Deleted

## 2022-11-15 NOTE — Telephone Encounter (Signed)
Patient called in left VM asking to schedule a TIFF procedure.

## 2022-11-17 ENCOUNTER — Ambulatory Visit (INDEPENDENT_AMBULATORY_CARE_PROVIDER_SITE_OTHER): Payer: Medicare Other | Admitting: Clinical

## 2022-11-17 DIAGNOSIS — F4310 Post-traumatic stress disorder, unspecified: Secondary | ICD-10-CM

## 2022-11-17 DIAGNOSIS — F419 Anxiety disorder, unspecified: Secondary | ICD-10-CM

## 2022-11-17 DIAGNOSIS — F17200 Nicotine dependence, unspecified, uncomplicated: Secondary | ICD-10-CM

## 2022-11-17 DIAGNOSIS — Z720 Tobacco use: Secondary | ICD-10-CM | POA: Diagnosis not present

## 2022-11-17 DIAGNOSIS — F331 Major depressive disorder, recurrent, moderate: Secondary | ICD-10-CM | POA: Diagnosis not present

## 2022-11-17 NOTE — Progress Notes (Signed)
IN PERSON   I connected with Morgan Fields on 11/17/22 at  10:00 AM EDT in person and verified that I am speaking with the correct person using two identifiers.   Location: Patient: Office Provider: Office   I discussed the limitations of evaluation and management by telemedicine and the availability of in person appointments. The patient expressed understanding and agreed to proceed. ( IN PERSON)     THERAPIST PROGRESS NOTE   Session Time: 10:00 AM-10:55 AM   Participation Level: Active   Behavioral Response: CasualAlertDepressed   Type of Therapy: Individual Therapy   Treatment Goals addressed: Coping   Interventions: CBT, DBT, Solution Focused, Strength-based and Supportive   Summary: Morgan Fields is a 72 y.o. female who presents with PTSD as well as  Depression with Anxiety. The OPT therapist worked with the patient for her OPT treatment. The OPT therapist utilized Motivational Interviewing to assist in creating therapeutic repore. The patient in the session was engaged and work in collaboration giving feedback about her stressors over the past few weeks. The patient identified changes that she has been implementing including not putting herself on the back-burner as much and utilizing a time Actuary (planner) which has helped her to section her day, journal, and balance work/leisure time. The patient spoke about change with her husband coming out of retirement going back to work allowing her to have more individual time to invest in herself. The OPT therapist utilized Cognitive Behavioral Therapy through cognitive restructuring as well as worked with the patient on coping strategies to assist in management of mood including ongoing motivation and empowerment for the patient to be as active as possible with her current health limitations and to dedicate part of her time to her own leisure and self care. The patient spoke about being more active in challenging  negative self thought and not looking outward for validation. The patient spoke about her upcoming treatment work to repair a hernia.    Suicidal/Homicidal: Nowithout intent/plan   Therapist Response: The OPT therapist worked with the patient for the patients scheduled session. The patient was engaged in her session and gave feedback in relation to triggers, symptoms, and behavior responses over the past few weeks.The OPT therapist worked with the patient utilizing an in session Cognitive Behavioral Therapy exercise. The patient was responsive in the session and verbalized, " I have been more comfortable with not feeling like I need to say yes to everyone else". The OPT therapist worked with the patient on balancing her stressors with leisure and utilizing her support network. Managing different sections of her life. The patient is currently working on painting in the home and verbalized she is going to focus on her leisure time more consistently to help her manage her overall general stress. The patient spoke about working with and liking her dietitian who is helping help her with her IBS. The OPT therapist will continue [place] treatment work with the patient in her next scheduled session.     Plan: Return again in 2 weeks.   Diagnosis:      Axis I:  PTSD/Major depressive disorder, recurrent episode, moderate with anxious distress /Tobacco Use                           Axis II: No diagnosis     Collaboration of Care: Overview of patient involvement in the med management program with Dr. Shea Evans.   Patient/Guardian was advised Release of  Information must be obtained prior to any record release in order to collaborate their care with an outside provider. Patient/Guardian was advised if they have not already done so to contact the registration department to sign all necessary forms in order for [place] to release information regarding their care.    Consent: Patient/Guardian gives verbal consent for treatment  and assignment of benefits for services provided during this visit. Patient/Guardian expressed understanding and agreed to proceed.    I discussed the assessment and treatment plan with the patient. The patient was provided an opportunity to ask questions and all were answered. The patient agreed with the plan and demonstrated an understanding of the instructions.   The patient was advised to call back or seek an in-person evaluation if the symptoms worsen or if the condition fails to improve as anticipated.   I provided 55 minutes of face-to-face time during this encounter.   Lennox Grumbles, LCSW   11/17/2022

## 2022-11-17 NOTE — Telephone Encounter (Signed)
I discussed in detail the plan to proceed with cTIF, as she is interested in this.  Prior to proceeding with this, we will need to have an evaluation with pH impedance testing and manometry off PPI.  Depending on findings we will need to proceed with a CT of the chest and abdomen prior to referring to Dr. Cam Hai office.  Hi Ann, can you please refer the patient to [facility] for pH impedance testing and manometry off PPI. Dx: dysphagia, GERD. Please send as urgent please.  Thanks,   Maylon Peppers, MD Gastroenterology and Hepatology [facility]

## 2022-11-18 NOTE — Telephone Encounter (Signed)
Referral sent to [facility]

## 2022-11-21 ENCOUNTER — Encounter: Payer: Self-pay | Admitting: Nutrition

## 2022-12-06 ENCOUNTER — Ambulatory Visit (INDEPENDENT_AMBULATORY_CARE_PROVIDER_SITE_OTHER): Payer: Medicare Other | Admitting: Gastroenterology

## 2022-12-06 ENCOUNTER — Encounter (INDEPENDENT_AMBULATORY_CARE_PROVIDER_SITE_OTHER): Payer: Self-pay | Admitting: Gastroenterology

## 2022-12-06 VITALS — BP 125/82 | HR 87 | Temp 98.9°F | Ht 62.0 in | Wt 114.4 lb

## 2022-12-06 DIAGNOSIS — R112 Nausea with vomiting, unspecified: Secondary | ICD-10-CM

## 2022-12-06 DIAGNOSIS — R634 Abnormal weight loss: Secondary | ICD-10-CM | POA: Diagnosis not present

## 2022-12-06 DIAGNOSIS — K219 Gastro-esophageal reflux disease without esophagitis: Secondary | ICD-10-CM | POA: Diagnosis not present

## 2022-12-06 DIAGNOSIS — R197 Diarrhea, unspecified: Secondary | ICD-10-CM | POA: Diagnosis not present

## 2022-12-06 MED ORDER — SUCRALFATE 1 G PO TABS: 1.0000 g | ORAL_TABLET | Freq: Three times a day (TID) | ORAL | 0 refills | Status: DC

## 2022-12-06 MED ORDER — PEG 3350-KCL-NA BICARB-NACL 420 G PO SOLR: 4000.0000 mL | Freq: Once | ORAL | 0 refills | Status: AC

## 2022-12-06 NOTE — Progress Notes (Unsigned)
Referring Provider: Ginny Forth* Primary Care Physician:  Yves Dill, NP Primary GI Physician: Jenetta Downer   Chief Complaint  Patient presents with   Follow-up    Follow up. Was having diarrhea 8-9 times per day and now 4-5 times day. Still has nausea.  Reports she is still losing weight. Reports she is still bad. Taking pepcid 40mg  one bid and sometimes takes a third one.    HPI:   Morgan ROMANIELLO is a 72 y.o. female with past medical history of depression, HLD, osteoporosis, vitamin d deficiency   Patient presenting today for follow up of diarrhea  Last seen January 2024, at that time, reported diagnosis of IBS in 2014, started having mor diarrhea in August. Taking bentyl with some results, cannot take imodium as it caused depresion. Having 4-5 episodes of diarrhea per day, maybe less if she eats less. Has some abdominal cramping, RUQ pain, bloating, epigastric tenderness. Having some vomiting of acid, taking pepcid 20mg  BID.  Recommended to check GI pathogen panel, pancreatic elastase, fecal fat,  increase Pepcid to 40mg  BID, EGD w SB biopsies, TCS if stoool studies negative. Consider BAS, low fodmap guide.   Fecal fat, pancreatic elastase, GI pathogen panel negative. EGD as below. She has been referred to [facility] for pH impedence testing and manometry off therapy.   Present: Still having diarrhea, having 4-5 BMs per day. She is taking questran 8g BID. She notes some improvement initially when increasing the dose of questran but then symptoms went back to baseline with loose to watery stools. She saw nutritionist, started low FODMAP diet and followed it very closely she noticed no difference. She notes that small amounts of food tend to keep stools less watery. Has some lower abdominal cramping for the past few days.  She notes some continued diffuse upper abdominal pain. Sometimes upper abdominal pain is worse with movement. Eating makes pain worse. She has  had to take an extra pepcid at night, maybe twice per week, despite taking 40mg  pepcid BID. Does not seem to matter what she has eaten. Still having some nausea. She notes acid regurgitation when she is having symptoms. Not having much heartburn.   Notes she weighed 123 in August, 114 lbs today.   Last Colonoscopy: 04/2013 Normal mucosa of terminal ileum. Few small diverticula at sigmoid colon otherwise normal colonoscopy. Random biopsies taken for mucosa of sigmoid colon looking for microscopic or collagenous colitis.  Last Endoscopy:11/08/2022 - 3 cm hiatal hernia.                           - Erosive gastropathy with no stigmata of recent                            bleeding. Biopsied-normal biopsies                            - Normal examined duodenum.  Recommendations:    Past Medical History:  Diagnosis Date   Complication of anesthesia    takes a long time to wake up from anesthesia   Depression    HLD (hyperlipidemia) 07/22/2019   Osteoporosis 07/22/2019   PMB (postmenopausal bleeding) 03/11/2016   Post-menopause on HRT (hormone replacement therapy) 03/11/2016   Skin cancer    Recent to anterior chest; cervical cancer   Thickened endometrium 03/16/2016   Vaginal Pap smear,  abnormal    Vitamin D deficiency disease 07/22/2019    Past Surgical History:  Procedure Laterality Date   BIOPSY  11/08/2022   Procedure: BIOPSY;  Surgeon: Harvel Quale, MD;  Location: [facility];  Service: Gastroenterology;;   BREAST ENHANCEMENT SURGERY     CERVICAL BIOPSY     precancerous years ago   CHOLECYSTECTOMY  2011   COLONOSCOPY N/A 05/08/2013   Procedure: COLONOSCOPY;  Surgeon: Rogene Houston, MD;  Location: [facility];  Service: Endoscopy;  Laterality: N/A;  200   ESOPHAGOGASTRODUODENOSCOPY (EGD) WITH PROPOFOL N/A 11/08/2022   Procedure: ESOPHAGOGASTRODUODENOSCOPY (EGD) WITH PROPOFOL;  Surgeon: Harvel Quale, MD;  Location: [facility];  Service: Gastroenterology;   Laterality: N/A;  10:15 AM   HYSTEROSCOPY WITH D & C N/A 10/05/2016   Procedure: HYSTEROSCOPY; UTERINE CURETTAGE;  Surgeon: Florian Buff, MD;  Location: [facility];  Service: Gynecology;  Laterality: N/A;   POLYPECTOMY N/A 10/05/2016   Procedure: REMOVAL OF ENDOMETRIAL POLYP;  Surgeon: Florian Buff, MD;  Location: [facility];  Service: Gynecology;  Laterality: N/A;   TUBAL LIGATION      Current Outpatient Medications  Medication Sig Dispense Refill   calcium-vitamin D (OSCAL WITH D) 500-5 MG-MCG tablet Take 1 tablet by mouth daily with breakfast.     cholestyramine (QUESTRAN) 4 g packet Take 2 packets (8 g total) by mouth 2 (two) times daily. 180 each 2   DULoxetine (CYMBALTA) 60 MG capsule Take 1 capsule (60 mg total) by mouth daily. 90 capsule 1   famotidine (PEPCID) 40 MG tablet Take 1 tablet (40 mg total) by mouth 2 (two) times daily. 60 tablet 3   meloxicam (MOBIC) 7.5 MG tablet Take 7.5 mg by mouth 2 (two) times daily as needed for pain.     Multiple Vitamin (MULTIVITAMIN WITH MINERALS) TABS tablet Take 1 tablet by mouth daily.     prazosin (MINIPRESS) 1 MG capsule Take 1 capsule (1 mg total) by mouth at bedtime. 90 capsule 1   propranolol (INDERAL) 10 MG tablet TAKE (1) TABLET BY MOUTH TWICE A DAY AS NEEDED FOR ANXIETY. 180 tablet 1   simvastatin (ZOCOR) 5 MG tablet Take 5 mg by mouth at bedtime.     No current facility-administered medications for this visit.    Allergies as of 12/06/2022   (No Known Allergies)    Family History  Problem Relation Age of Onset   Dementia Mother    Hyperlipidemia Mother    Thyroid disease Mother    Hypertension Mother    Schizophrenia Father    Alcohol abuse Father        Schizophrenia   Depression Brother    Mental illness Brother        Schizophrenia (2 brothers); schizophrenia and bipolar (1 brother)   Heart attack Brother    Schizophrenia Brother    Kidney failure Brother    Bipolar disorder Brother    Alcohol abuse Maternal Uncle     Alcohol abuse Paternal Uncle    Alzheimer's disease Maternal Grandfather    Cancer Maternal Grandmother        colon   Diabetes Maternal Grandmother    Heart attack Paternal Grandfather    Heart disease Paternal Grandfather    Diabetes Paternal Grandfather    Mental illness Paternal Grandmother    Polycystic ovary syndrome Daughter    Other Daughter        knee replacement   Thyroid disease Daughter  had radiation on thyroid    Social History   Socioeconomic History   Marital status: Married    Spouse name: Not on file   Number of children: Not on file   Years of education: Not on file   Highest education level: Not on file  Occupational History   Not on file  Tobacco Use   Smoking status: Every Day    Packs/day: 0.50    Years: 37.00    Additional pack years: 0.00    Total pack years: 18.50    Types: Cigarettes    Passive exposure: Current   Smokeless tobacco: Never   Tobacco comments:    smokes 5 cig daily  Vaping Use   Vaping Use: Never used  Substance and Sexual Activity   Alcohol use: Not Currently    Comment: Quit September 2019   Drug use: No   Sexual activity: Yes    Birth control/protection: Post-menopausal  Other Topics Concern   Not on file  Social History Narrative   Married for 49 years.Retired,used to work at [employer].   Social Determinants of Health   Financial Resource Strain: Low Risk  (01/09/2020)   Overall Financial Resource Strain (CARDIA)    Difficulty of Paying Living Expenses: Not hard at all  Food Insecurity: No Food Insecurity (01/09/2020)   Hunger Vital Sign    Worried About Running Out of Food in the Last Year: Never true    Ran Out of Food in the Last Year: Never true  Transportation Needs: No Transportation Needs (01/09/2020)   PRAPARE - Hydrologist (Medical): No    Lack of Transportation (Non-Medical): No  Physical Activity: Sufficiently Active (01/09/2020)   Exercise Vital Sign     Days of Exercise per Week: 5 days    Minutes of Exercise per Session: 30 min  Stress: No Stress Concern Present (01/09/2020)   [place]    Feeling of Stress : Only a little  Social Connections: Socially Integrated (01/09/2020)   Social Connection and Isolation Panel [NHANES]    Frequency of Communication with Friends and Family: Once a week    Frequency of Social Gatherings with Friends and Family: More than three times a week    Attends Religious Services: More than 4 times per year    Active Member of [employer] or Organizations: Yes    Attends Music therapist: More than 4 times per year    Marital Status: Married    Review of systems General: negative for malaise, night sweats, fever, chills, weight los Neck: Negative for lumps, goiter, pain and significant neck swelling Resp: Negative for cough, wheezing, dyspnea at rest CV: Negative for chest pain, leg swelling, palpitations, orthopnea GI: denies melena, hematochezia, nausea, vomiting, diarrhea, constipation, dysphagia, odyonophagia, early satiety or unintentional weight loss.  MSK: Negative for joint pain or swelling, back pain, and muscle pain. Derm: Negative for itching or rash Psych: Denies depression, anxiety, memory loss, confusion. No homicidal or suicidal ideation.  Heme: Negative for prolonged bleeding, bruising easily, and swollen nodes. Endocrine: Negative for cold or heat intolerance, polyuria, polydipsia and goiter. Neuro: negative for tremor, gait imbalance, syncope and seizures. The remainder of the review of systems is noncontributory.  Physical Exam: BP 125/82 (BP Location: Left Arm, Patient Position: Sitting, Cuff Size: Normal)   Pulse 87   Temp 98.9 F (37.2 C) (Oral)   Ht 5\' 2"  (1.575 m)  Wt 114 lb 6.4 oz (51.9 kg)   BMI 20.92 kg/m  General:   Alert and oriented. No distress noted. Pleasant and cooperative.  Head:   Normocephalic and atraumatic. Eyes:  Conjuctiva clear without scleral icterus. Mouth:  Oral mucosa pink and moist. Good dentition. No lesions. Heart: Normal rate and rhythm, s1 and s2 heart sounds present.  Lungs: Clear lung sounds in all lobes. Respirations equal and unlabored. Abdomen:  +BS, soft, non-tender and non-distended. No rebound or guarding. No HSM or masses noted. Derm: No palmar erythema or jaundice Msk:  Symmetrical without gross deformities. Normal posture. Extremities:  Without edema. Neurologic:  Alert and  oriented x4 Psych:  Alert and cooperative. Normal mood and affect.  Invalid input(s): "6 MONTHS"   ASSESSMENT: DENE DANTUONO is a 72 y.o. female presenting today    PLAN:  Continue Pepcid 40mg  BID  2. Carafate 1g QID 3. Schedule colonoscopy-ASA II   4. Probiotic samples  5. Questran 8mg  BID   All questions were answered, patient verbalized understanding and is in agreement with plan as outlined above.    Follow Up: 3 months   Wiley Flicker L. Alver Sorrow, MSN, APRN, AGNP-C Adult-Gerontology Nurse Practitioner [facility]

## 2022-12-06 NOTE — H&P (View-Only) (Signed)
Referring Provider: Ginny Forth* Primary Care Physician:  Yves Dill, NP Primary GI Physician: Jenetta Downer   Chief Complaint  Patient presents with   Follow-up    Follow up. Was having diarrhea 8-9 times per day and now 4-5 times day. Still has nausea.  Reports she is still losing weight. Reports she is still bad. Taking pepcid 40mg  one bid and sometimes takes a third one.    HPI:   Morgan Fields is a 72 y.o. female with past medical history of depression, HLD, osteoporosis, vitamin d deficiency   Patient presenting today for follow up of diarrhea and GERD.  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 Taking bentyl with some results, cannot take imodium as it caused depresion. Having 4-5 episodes of diarrhea per day, maybe less if she eats less. Has some abdominal cramping, RUQ pain, bloating, epigastric tenderness. Having some vomiting of acid, taking pepcid 20mg  BID.  Recommended to check GI pathogen panel, pancreatic elastase, fecal fat,  increase Pepcid to 40mg  BID, EGD w SB biopsies, TCS if stool studies negative. Consider BAS, low fodmap guide.   Fecal fat, pancreatic elastase, GI pathogen panel negative. EGD as below. She has been referred to [facility] for pH impedence testing and manometry off therapy.   Present: Still having diarrhea, having 4-5 BMs per day. She is taking questran 8g BID. She notes some improvement with questran as in stools are not as watery or as frequent, though continue to be loose. She saw nutritionist, started low FODMAP diet and followed it very closely she noticed no difference. She notes that small amounts of food cause less diarrhea. Has some lower abdominal cramping for the past few days. No blood in stools or black stools.   has some continued diffuse upper abdominal pain. Sometimes upper abdominal pain is worse with movement. Eating makes pain worse. She  has had to take an extra pepcid at night, maybe twice per week, despite taking 40mg  pepcid BID. We have avoided PPI therapy given her history of osteoporosis. Does not seem to matter what she has eaten. Still having some nausea. She notes acid regurgitation. Not having much heartburn.   Notes she weighed 123 in August, 114 lbs today.   Last Colonoscopy: 04/2013 Normal mucosa of terminal ileum. Few small diverticula at sigmoid colon otherwise normal colonoscopy. Random biopsies taken for mucosa of sigmoid colon looking for microscopic or collagenous colitis.  Last Endoscopy:11/08/2022 - 3 cm hiatal hernia.                           - Erosive gastropathy with no stigmata of recent                            bleeding. Biopsied-normal biopsies                            - Normal examined duodenum.  Recommendations:    Past Medical History:  Diagnosis Date   Complication of anesthesia    takes a long time to wake up from anesthesia   Depression    HLD (hyperlipidemia) 07/22/2019   Osteoporosis 07/22/2019   PMB (postmenopausal bleeding) 03/11/2016   Post-menopause on HRT (hormone replacement therapy) 03/11/2016   Skin cancer    Recent to anterior chest; cervical cancer  Thickened endometrium 03/16/2016   Vaginal Pap smear, abnormal    Vitamin D deficiency disease 07/22/2019    Past Surgical History:  Procedure Laterality Date   BIOPSY  11/08/2022   Procedure: BIOPSY;  Surgeon: Harvel Quale, MD;  Location: [facility];  Service: Gastroenterology;;   BREAST ENHANCEMENT SURGERY     CERVICAL BIOPSY     precancerous years ago   CHOLECYSTECTOMY  2011   COLONOSCOPY N/A 05/08/2013   Procedure: COLONOSCOPY;  Surgeon: Rogene Houston, MD;  Location: [facility];  Service: Endoscopy;  Laterality: N/A;  200   ESOPHAGOGASTRODUODENOSCOPY (EGD) WITH PROPOFOL N/A 11/08/2022   Procedure: ESOPHAGOGASTRODUODENOSCOPY (EGD) WITH PROPOFOL;  Surgeon: Harvel Quale, MD;  Location: [facility];  Service: Gastroenterology;  Laterality: N/A;  10:15 AM   HYSTEROSCOPY WITH D & C N/A 10/05/2016   Procedure: HYSTEROSCOPY; UTERINE CURETTAGE;  Surgeon: Florian Buff, MD;  Location: [facility];  Service: Gynecology;  Laterality: N/A;   POLYPECTOMY N/A 10/05/2016   Procedure: REMOVAL OF ENDOMETRIAL POLYP;  Surgeon: Florian Buff, MD;  Location: [facility];  Service: Gynecology;  Laterality: N/A;   TUBAL LIGATION      Current Outpatient Medications  Medication Sig Dispense Refill   calcium-vitamin D (OSCAL WITH D) 500-5 MG-MCG tablet Take 1 tablet by mouth daily with breakfast.     cholestyramine (QUESTRAN) 4 g packet Take 2 packets (8 g total) by mouth 2 (two) times daily. 180 each 2   DULoxetine (CYMBALTA) 60 MG capsule Take 1 capsule (60 mg total) by mouth daily. 90 capsule 1   famotidine (PEPCID) 40 MG tablet Take 1 tablet (40 mg total) by mouth 2 (two) times daily. 60 tablet 3   meloxicam (MOBIC) 7.5 MG tablet Take 7.5 mg by mouth 2 (two) times daily as needed for pain.     Multiple Vitamin (MULTIVITAMIN WITH MINERALS) TABS tablet Take 1 tablet by mouth daily.     prazosin (MINIPRESS) 1 MG capsule Take 1 capsule (1 mg total) by mouth at bedtime. 90 capsule 1   propranolol (INDERAL) 10 MG tablet TAKE (1) TABLET BY MOUTH TWICE A DAY AS NEEDED FOR ANXIETY. 180 tablet 1   simvastatin (ZOCOR) 5 MG tablet Take 5 mg by mouth at bedtime.     No current facility-administered medications for this visit.    Allergies as of 12/06/2022   (No Known Allergies)    Family History  Problem Relation Age of Onset   Dementia Mother    Hyperlipidemia Mother    Thyroid disease Mother    Hypertension Mother    Schizophrenia Father    Alcohol abuse Father        Schizophrenia   Depression Brother    Mental illness Brother        Schizophrenia (2 brothers); schizophrenia and bipolar (1 brother)   Heart attack Brother    Schizophrenia Brother    Kidney failure Brother    Bipolar disorder Brother     Alcohol abuse Maternal Uncle    Alcohol abuse Paternal Uncle    Alzheimer's disease Maternal Grandfather    Cancer Maternal Grandmother        colon   Diabetes Maternal Grandmother    Heart attack Paternal Grandfather    Heart disease Paternal Grandfather    Diabetes Paternal Grandfather    Mental illness Paternal Grandmother    Polycystic ovary syndrome Daughter    Other Daughter        knee replacement  Thyroid disease Daughter        had radiation on thyroid    Social History   Socioeconomic History   Marital status: Married    Spouse name: Not on file   Number of children: Not on file   Years of education: Not on file   Highest education level: Not on file  Occupational History   Not on file  Tobacco Use   Smoking status: Every Day    Packs/day: 0.50    Years: 37.00    Additional pack years: 0.00    Total pack years: 18.50    Types: Cigarettes    Passive exposure: Current   Smokeless tobacco: Never   Tobacco comments:    smokes 5 cig daily  Vaping Use   Vaping Use: Never used  Substance and Sexual Activity   Alcohol use: Not Currently    Comment: Quit September 2019   Drug use: No   Sexual activity: Yes    Birth control/protection: Post-menopausal  Other Topics Concern   Not on file  Social History Narrative   Married for 49 years.Retired,used to work at [employer].   Social Determinants of Health   Financial Resource Strain: Low Risk  (01/09/2020)   Overall Financial Resource Strain (CARDIA)    Difficulty of Paying Living Expenses: Not hard at all  Food Insecurity: No Food Insecurity (01/09/2020)   Hunger Vital Sign    Worried About Running Out of Food in the Last Year: Never true    Ran Out of Food in the Last Year: Never true  Transportation Needs: No Transportation Needs (01/09/2020)   PRAPARE - Hydrologist (Medical): No    Lack of Transportation (Non-Medical): No  Physical Activity: Sufficiently Active  (01/09/2020)   Exercise Vital Sign    Days of Exercise per Week: 5 days    Minutes of Exercise per Session: 30 min  Stress: No Stress Concern Present (01/09/2020)   [place]    Feeling of Stress : Only a little  Social Connections: Socially Integrated (01/09/2020)   Social Connection and Isolation Panel [NHANES]    Frequency of Communication with Friends and Family: Once a week    Frequency of Social Gatherings with Friends and Family: More than three times a week    Attends Religious Services: More than 4 times per year    Active Member of [employer] or Organizations: Yes    Attends Music therapist: More than 4 times per year    Marital Status: Married   Review of systems General: negative for malaise, night sweats, fever, chills, weight loss Neck: Negative for lumps, goiter, pain and significant neck swelling Resp: Negative for cough, wheezing, dyspnea at rest CV: Negative for chest pain, leg swelling, palpitations, orthopnea GI: denies melena, hematochezia, vomiting, constipation, dysphagia, odyonophagia, early satiety  +nausea +epigastric pain +diarrhea +weight loss  MSK: Negative for joint pain or swelling, back pain, and muscle pain. Derm: Negative for itching or rash Psych: Denies depression, anxiety, memory loss, confusion. No homicidal or suicidal ideation.  Heme: Negative for prolonged bleeding, bruising easily, and swollen nodes. Endocrine: Negative for cold or heat intolerance, polyuria, polydipsia and goiter. Neuro: negative for tremor, gait imbalance, syncope and seizures. The remainder of the review of systems is noncontributory.  Physical Exam: BP 125/82 (BP Location: Left Arm, Patient Position: Sitting, Cuff Size: Normal)   Pulse 87   Temp 98.9 F (37.2 C) (  Oral)   Ht 5\' 2"  (1.575 m)   Wt 114 lb 6.4 oz (51.9 kg)   BMI 20.92 kg/m  General:   Alert and oriented. No distress noted. Pleasant  and cooperative.  Head:  Normocephalic and atraumatic. Eyes:  Conjuctiva clear without scleral icterus. Mouth:  Oral mucosa pink and moist. Good dentition. No lesions. Heart: Normal rate and rhythm, s1 and s2 heart sounds present.  Lungs: Clear lung sounds in all lobes. Respirations equal and unlabored. Abdomen:  +BS, soft, non-tender and non-distended. No rebound or guarding. No HSM or masses noted. Derm: No palmar erythema or jaundice Msk:  Symmetrical without gross deformities. Normal posture. Extremities:  Without edema. Neurologic:  Alert and  oriented x4 Psych:  Alert and cooperative. Normal mood and affect.  Invalid input(s): "6 MONTHS"   ASSESSMENT: CAILAN BLACKSHER is a 72 y.o. female presenting today for follow up of diarrhea/GERD   Patient continues to have loose stools up to 4-5 per day, slowed slightly on questran 8mg  BID, no rectal bleeding or melena. Previous stool studies have been negative. She has lost about 10 pounds since June 2023. Last TCS was August of 2014. Recommending colonoscopy with random colonic biopsies as I am unable to rule out other causes of diarrhea such as microscopic colitis. Will also provide some samples of daily probiotic for her to try. Indications, risks and benefits of procedure discussed in detail with patient. Patient verbalized understanding and is in agreement to proceed with colonoscopy.   GERD/Nausea: Recent EGD with erosive gastropathy, she has been maintained on Pepcid 40mg  BID, avoiding PPI therapy given history of Osteoporosis. Continues to have nausea, some GERD symptoms. Ideally would be best to continue to avoid PPI therapy at this time. She has referral to [facility] for pH impedence and esophageal manometry testing in preparation of possible c-TIF procedure with DR. Castaneda. For now, will continue with Pepcid 40mg  BID, will add carafate 1g for her to use up to QID for symptomatic relief.    PLAN:  Continue Pepcid 40mg  BID  2.  Carafate 1g QID PRN 3. Schedule colonoscopy with random colonic biopsies-ASA II   4. Probiotic samples  5. Questran 8mg  BID   All questions were answered, patient verbalized understanding and is in agreement with plan as outlined above.    Follow Up: 3 months   [place] Ursua L. Alver Sorrow, MSN, APRN, AGNP-C Adult-Gerontology Nurse Practitioner [facility]  I have reviewed the note and agree with the APP's assessment as described in this progress note  Maylon Peppers, MD Gastroenterology and Hepatology [facility]

## 2022-12-06 NOTE — Patient Instructions (Signed)
Continue Pepcid 40mg  twice daily  Start Carafate 1g, you can dissolve tablet in 1/2 oz of water, mix and drink when you feel reflux symptoms and at bedtime  We will get you Scheduled colonoscopy I have provided Probiotic samples  Continue Questran 8mg  twice daily  Follow up 3 months

## 2022-12-08 ENCOUNTER — Other Ambulatory Visit: Payer: Self-pay | Admitting: Psychiatry

## 2022-12-08 DIAGNOSIS — F411 Generalized anxiety disorder: Secondary | ICD-10-CM

## 2022-12-08 DIAGNOSIS — F4310 Post-traumatic stress disorder, unspecified: Secondary | ICD-10-CM

## 2022-12-14 ENCOUNTER — Encounter (HOSPITAL_COMMUNITY): Payer: Self-pay | Admitting: Gastroenterology

## 2022-12-14 ENCOUNTER — Encounter (INDEPENDENT_AMBULATORY_CARE_PROVIDER_SITE_OTHER): Payer: Self-pay | Admitting: *Deleted

## 2022-12-14 ENCOUNTER — Encounter (HOSPITAL_COMMUNITY)
Admission: RE | Disposition: A | Discharge status: Home / Self Care | Payer: Self-pay | Source: Home / Self Care | Attending: Gastroenterology

## 2022-12-14 ENCOUNTER — Ambulatory Visit (HOSPITAL_COMMUNITY)
Admission: RE | Admit: 2022-12-14 | Discharge: 2022-12-14 | Disposition: A | Discharge status: Home / Self Care | Payer: Medicare Other | Attending: Gastroenterology | Admitting: Gastroenterology

## 2022-12-14 ENCOUNTER — Other Ambulatory Visit: Payer: Self-pay

## 2022-12-14 ENCOUNTER — Ambulatory Visit (HOSPITAL_COMMUNITY): Payer: Medicare Other | Admitting: Anesthesiology

## 2022-12-14 ENCOUNTER — Ambulatory Visit (HOSPITAL_BASED_OUTPATIENT_CLINIC_OR_DEPARTMENT_OTHER): Payer: Medicare Other | Admitting: Anesthesiology

## 2022-12-14 DIAGNOSIS — F17210 Nicotine dependence, cigarettes, uncomplicated: Secondary | ICD-10-CM | POA: Insufficient documentation

## 2022-12-14 DIAGNOSIS — R634 Abnormal weight loss: Secondary | ICD-10-CM | POA: Diagnosis not present

## 2022-12-14 DIAGNOSIS — K6389 Other specified diseases of intestine: Secondary | ICD-10-CM

## 2022-12-14 DIAGNOSIS — R197 Diarrhea, unspecified: Secondary | ICD-10-CM

## 2022-12-14 DIAGNOSIS — M810 Age-related osteoporosis without current pathological fracture: Secondary | ICD-10-CM | POA: Diagnosis not present

## 2022-12-14 DIAGNOSIS — Z800 Family history of malignant neoplasm of digestive organs: Secondary | ICD-10-CM | POA: Diagnosis not present

## 2022-12-14 DIAGNOSIS — K648 Other hemorrhoids: Secondary | ICD-10-CM

## 2022-12-14 DIAGNOSIS — E785 Hyperlipidemia, unspecified: Secondary | ICD-10-CM | POA: Insufficient documentation

## 2022-12-14 DIAGNOSIS — K529 Noninfective gastroenteritis and colitis, unspecified: Secondary | ICD-10-CM | POA: Insufficient documentation

## 2022-12-14 HISTORY — PX: COLONOSCOPY WITH PROPOFOL: SHX5780

## 2022-12-14 HISTORY — PX: BIOPSY: SHX5522

## 2022-12-14 LAB — HM COLONOSCOPY

## 2022-12-14 SURGERY — COLONOSCOPY WITH PROPOFOL
Anesthesia: General

## 2022-12-14 MED ORDER — LACTATED RINGERS IV SOLN
INTRAVENOUS | Status: DC
  Administered 2022-12-14: 1000 mL via INTRAVENOUS

## 2022-12-14 MED ORDER — PROPOFOL 10 MG/ML IV BOLUS
INTRAVENOUS | Status: DC | PRN
  Administered 2022-12-14: 30 mg via INTRAVENOUS
  Administered 2022-12-14: 50 mg via INTRAVENOUS

## 2022-12-14 MED ORDER — PHENYLEPHRINE 80 MCG/ML (10ML) SYRINGE FOR IV PUSH (FOR BLOOD PRESSURE SUPPORT)
PREFILLED_SYRINGE | INTRAVENOUS | Status: DC | PRN
  Administered 2022-12-14: 80 ug via INTRAVENOUS
  Administered 2022-12-14: 160 ug via INTRAVENOUS

## 2022-12-14 MED ORDER — PROPOFOL 500 MG/50ML IV EMUL
INTRAVENOUS | Status: DC | PRN
  Administered 2022-12-14: 150 ug/kg/min via INTRAVENOUS

## 2022-12-14 MED ORDER — LIDOCAINE HCL (CARDIAC) PF 100 MG/5ML IV SOSY
PREFILLED_SYRINGE | INTRAVENOUS | Status: DC | PRN
  Administered 2022-12-14: 50 mg via INTRATRACHEAL

## 2022-12-14 NOTE — Transfer of Care (Signed)
Immediate Anesthesia Transfer of Care Note  Patient: Morgan Fields  Procedure(s) Performed: COLONOSCOPY WITH PROPOFOL BIOPSY  Patient Location: Endoscopy Unit  Anesthesia Type:General  Level of Consciousness: sedated, patient cooperative, and responds to stimulation  Airway & Oxygen Therapy: Patient Spontanous Breathing  Post-op Assessment: Report given to RN, Post -op Vital signs reviewed and stable, and Patient moving all extremities  Post vital signs: Reviewed and stable  Last Vitals:  Vitals Value Taken Time  BP 92/64 12/14/22 0907  Temp 36.6 C 12/14/22 0907  Pulse 86 12/14/22 0907  Resp 18 12/14/22 0907  SpO2 98 % 12/14/22 0907    Last Pain:  Vitals:   12/14/22 0907  TempSrc: Axillary  PainSc: 0-No pain      Patients Stated Pain Goal: 5 [phone]  Complications: No notable events documented.

## 2022-12-14 NOTE — Op Note (Signed)
[facility] Patient Name: Morgan Fields Procedure Date: 12/14/2022 8:23 AM MRN: [id number] Date of Birth: 10-30-1950 Attending MD: Maylon Peppers , , [id number] CSN: [id number] Age: 72 Admit Type: Outpatient Procedure:                Colonoscopy Indications:              Chronic diarrhea Providers:                Maylon Peppers, Crystal Page, Raphael Gibney,                            Technician Referring MD:              Medicines:                Monitored Anesthesia Care Complications:            No immediate complications. Estimated Blood Loss:     Estimated blood loss: none. Procedure:                Pre-Anesthesia Assessment:                           - Prior to the procedure, a History and Physical                            was performed, and patient medications, allergies                            and sensitivities were reviewed. The patient's                            tolerance of previous anesthesia was reviewed.                           - The risks and benefits of the procedure and the                            sedation options and risks were discussed with the                            patient. All questions were answered and informed                            consent was obtained.                           - After reviewing the risks and benefits, the                            patient was deemed in satisfactory condition to                            undergo the procedure.                           - ASA Grade Assessment: II - A patient with mild  systemic disease.                           After obtaining informed consent, the colonoscope                            was passed under direct vision. Throughout the                            procedure, the patient's blood pressure, pulse, and                            oxygen saturations were monitored continuously. The                            [id number]-[id number]) scope was  introduced through                            the anus and advanced to the the terminal ileum.                            The colonoscopy was performed without difficulty.                            The patient tolerated the procedure well. The                            quality of the bowel preparation was good. Scope In: 8:32:34 AM Scope Out: 9:03:19 AM Scope Withdrawal Time: 0 hours 23 minutes 0 seconds  Total Procedure Duration: 0 hours 30 minutes 45 seconds  Findings:      The perianal and digital rectal examinations were normal.      The terminal ileum appeared normal.      One subtle 10 mm mucosal area of nodularity was found in the cecum. Area       was successfully injected with 3 mL Eleview for lesion assessment, and       this injection appeared to help differentiate the lesion adequately.       However, upon inspection with NBI, it did not appear to have ana       adenomatous component. The nodularity appeared to be similar to the       normal mucosa. Biopsies were taken with a cold forceps for histology.      The colon (entire examined portion) appeared normal. Biopsies for       histology were taken with a cold forceps from the right colon and left       colon for evaluation of microscopic colitis.      Non-bleeding internal hemorrhoids were found during retroflexion. The       hemorrhoids were small. Impression:               - The examined portion of the ileum was normal.                           - Subtle mucosal nodularity in the cecum. Injected.  Biopsied.                           - The entire examined colon is normal. Biopsied.                           - Non-bleeding internal hemorrhoids. Moderate Sedation:      Per Anesthesia Care Recommendation:           - Discharge patient to home (ambulatory).                           - Resume previous diet.                           - Await pathology results.                           - Repeat  colonoscopy for surveillance based on                            pathology results.                           - Discuss with psychiatrist possibility of                            switching Cymbalta for a different agent. Procedure Code(s):        --- Professional ---                           [id number], Colonoscopy, flexible; with biopsy, single                            or multiple                           45381, Colonoscopy, flexible; with directed                            submucosal injection(s), any substance Diagnosis Code(s):        --- Professional ---                           K64.8, Other hemorrhoids                           K63.89, Other specified diseases of intestine                           K52.9, Noninfective gastroenteritis and colitis,                            unspecified CPT copyright 2022 American Medical Association. All rights reserved. The codes documented in this report are preliminary and upon coder review may  be revised to meet current compliance requirements. Maylon Peppers, MD Maylon Peppers,  12/14/2022 9:18:30 AM This report has been signed electronically. Number of Addenda: 0

## 2022-12-14 NOTE — Interval H&P Note (Signed)
History and Physical Interval Note:  12/14/2022 8:22 AM  Morgan Fields  has presented today for surgery, with the diagnosis of DIARRHEA, WEIGHT LOSS.  The various methods of treatment have been discussed with the patient and family. After consideration of risks, benefits and other options for treatment, the patient has consented to  Procedure(s) with comments: COLONOSCOPY WITH PROPOFOL (N/A) - 9:30am;ASA 2 as a surgical intervention.  The patient's history has been reviewed, patient examined, no change in status, stable for surgery.  I have reviewed the patient's chart and labs.  Questions were answered to the patient's satisfaction.     Maylon Peppers May[place]a

## 2022-12-14 NOTE — Discharge Instructions (Signed)
You are being discharged to home.  Resume your previous diet.  We are waiting for your pathology results.  Your physician has recommended a repeat colonoscopy for surveillance based on pathology results.  Discuss with psychiatrist possibility of switching Cymbalta for a different agent.

## 2022-12-14 NOTE — Anesthesia Preprocedure Evaluation (Signed)
Anesthesia Evaluation  Patient identified by MRN, date of birth, ID band Patient awake    Reviewed: Allergy & Precautions, H&P , NPO status , Patient's Chart, lab work & pertinent test results, reviewed documented beta blocker date and time   History of Anesthesia Complications (+) history of anesthetic complications  Airway Mallampati: II  TM Distance: >3 FB Neck ROM: Full    Dental  (+) Dental Advisory Given, Chipped,    Pulmonary Current Smoker and Patient abstained from smoking.   Pulmonary exam normal breath sounds clear to auscultation       Cardiovascular negative cardio ROS Normal cardiovascular exam Rhythm:Regular Rate:Normal     Neuro/Psych  PSYCHIATRIC DISORDERS Anxiety Depression    negative neurological ROS     GI/Hepatic Neg liver ROS,GERD  Medicated,,  Endo/Other  negative endocrine ROS    Renal/GU negative Renal ROS  negative genitourinary   Musculoskeletal negative musculoskeletal ROS (+)    Abdominal   Peds negative pediatric ROS (+)  Hematology negative hematology ROS (+)   Anesthesia Other Findings Complication of anesthesia Current moderate episode of major depressive disorder without prior episode ([place]) Depression Diarrhea Encounter for colorectal cancer screening Encounter for gynecological examination with Papanicolaou smear of cervix Encounter for well woman exam with routine gynecological exam GAD (generalized anxiety disorder) Gastroesophageal reflux disease Generalized abdominal pain HLD (hyperlipidemia) High risk medication use Loss of weight MDD (major depressive disorder), recurrent episode, mild (HCC) Nausea and vomiting Osteoporosis PMB (postmenopausal bleeding) PTSD (post-traumatic stress disorder) Post-menopause on HRT (hormone replacement therapy) Recurrent moderate major depressive disorder with anxiety (HCC) Sebaceous cyst Skin cancer Thickened  endometrium Tobacco use disorder Vitamin D deficiency disease    Reproductive/Obstetrics negative OB ROS                             Anesthesia Physical Anesthesia Plan  ASA: 2  Anesthesia Plan: General   Post-op Pain Management: Minimal or no pain anticipated   Induction: Intravenous  PONV Risk Score and Plan: Propofol infusion  Airway Management Planned: Nasal Cannula and Natural Airway  Additional Equipment:   Intra-op Plan:   Post-operative Plan:   Informed Consent: I have reviewed the patients History and Physical, chart, labs and discussed the procedure including the risks, benefits and alternatives for the proposed anesthesia with the patient or authorized representative who has indicated his/her understanding and acceptance.     Dental advisory given  Plan Discussed with: CRNA and Surgeon  Anesthesia Plan Comments:         Anesthesia Quick Evaluation

## 2022-12-14 NOTE — Anesthesia Postprocedure Evaluation (Signed)
Anesthesia Post Note  Patient: Morgan Fields  Procedure(s) Performed: COLONOSCOPY WITH PROPOFOL BIOPSY  Patient location during evaluation: Phase II Anesthesia Type: General Level of consciousness: awake and alert and oriented Pain management: pain level controlled Vital Signs Assessment: post-procedure vital signs reviewed and stable Respiratory status: spontaneous breathing, nonlabored ventilation and respiratory function stable Cardiovascular status: blood pressure returned to baseline and stable Postop Assessment: no apparent nausea or vomiting Anesthetic complications: no  No notable events documented.   Last Vitals:  Vitals:   12/14/22 0804 12/14/22 0907  BP: 110/77 92/64  Pulse: 91 86  Resp: 11 18  Temp: 36.6 C 36.6 C  SpO2: 99% 98%    Last Pain:  Vitals:   12/14/22 0914  TempSrc:   PainSc: 0-No pain                 Marquet Faircloth C Kemoni Quesenberry

## 2022-12-15 LAB — SURGICAL PATHOLOGY

## 2022-12-21 ENCOUNTER — Ambulatory Visit (INDEPENDENT_AMBULATORY_CARE_PROVIDER_SITE_OTHER): Payer: Medicare Other | Admitting: Psychiatry

## 2022-12-21 ENCOUNTER — Telehealth (HOSPITAL_COMMUNITY): Payer: Self-pay | Admitting: Psychiatry

## 2022-12-21 ENCOUNTER — Encounter: Payer: Self-pay | Admitting: Psychiatry

## 2022-12-21 VITALS — BP 136/77 | HR 81 | Temp 97.3°F | Ht 62.0 in | Wt 113.8 lb

## 2022-12-21 DIAGNOSIS — F331 Major depressive disorder, recurrent, moderate: Secondary | ICD-10-CM | POA: Diagnosis not present

## 2022-12-21 DIAGNOSIS — F4310 Post-traumatic stress disorder, unspecified: Secondary | ICD-10-CM | POA: Diagnosis not present

## 2022-12-21 DIAGNOSIS — F411 Generalized anxiety disorder: Secondary | ICD-10-CM | POA: Diagnosis not present

## 2022-12-21 DIAGNOSIS — F17200 Nicotine dependence, unspecified, uncomplicated: Secondary | ICD-10-CM

## 2022-12-21 MED ORDER — DULOXETINE HCL 20 MG PO CPEP: 20.0000 mg | ORAL_CAPSULE | Freq: Every day | ORAL | 1 refills | Status: DC

## 2022-12-21 MED ORDER — QUETIAPINE FUMARATE 25 MG PO TABS: 25.0000 mg | ORAL_TABLET | Freq: Every day | ORAL | 1 refills | Status: DC

## 2022-12-21 NOTE — Progress Notes (Unsigned)
[place] MD OP Progress Note  12/21/2022 10:03 AM Morgan Fields  MRN:  [id number]  Chief Complaint:  Chief Complaint  Patient presents with   Follow-up   Anxiety   Medication Refill   HPI: Morgan Fields is a 72 year old Caucasian female, married, retired, lives in [place], has a history of PTSD, GAD, MDD, tobacco use disorder, arthritis, osteoporosis was evaluated in office today.  Patient today appeared to be tearful in session.  She reports she has been struggling with gastric problems, has been having diarrhea since the past several weeks.  She reports she has been following up with gastroenterologist, has had multiple workup done, continues to struggle.  She reports she has not been eating much since due to the diarrhea she does not have a good appetite.  She hence may have lost some weight.  She also reports her husband is not very supportive.  He went back to work recently and she spends a lot of her time alone.  She reports although she is alone during the day she is fine however at the end of the day when her husband returns, she gets more anxious and agitated.  Patient reports she currently struggles with sadness, worrying about her health, crying spells, sleep problems.  She reports she has been having a lot of vivid dreams, sometimes violent dreams.  That does affect her sleep.  She is compliant on her medications.  It was brought to her attention by her providers that it is possible the Cymbalta likely causing GI problems.  Patient denies any suicidality, homicidality or perceptual disturbances.  Patient continues to follow-up with her therapist Mr. Maye Hides and has upcoming appointment.  Patient agreeable to referral for IOP program.  Patient denies any other concerns today.  Visit Diagnosis:    ICD-10-CM   1. PTSD (post-traumatic stress disorder)  F43.10 DULoxetine (CYMBALTA) 20 MG capsule    QUEtiapine (SEROQUEL) 25 MG tablet    2. GAD (generalized anxiety  disorder)  F41.1 DULoxetine (CYMBALTA) 20 MG capsule    QUEtiapine (SEROQUEL) 25 MG tablet    3. Recurrent moderate major depressive disorder with anxiety  F33.1 DULoxetine (CYMBALTA) 20 MG capsule   F41.9 QUEtiapine (SEROQUEL) 25 MG tablet    4. Tobacco use disorder  F17.200       Past Psychiatric History: I have reviewed past psychiatric history from progress note on 02/15/2022.  Past trials of Lexapro, hydroxyzine.  Past Medical History:  Past Medical History:  Diagnosis Date   Complication of anesthesia    takes a long time to wake up from anesthesia   Depression    HLD (hyperlipidemia) 07/22/2019   Osteoporosis 07/22/2019   PMB (postmenopausal bleeding) 03/11/2016   Post-menopause on HRT (hormone replacement therapy) 03/11/2016   Skin cancer    Recent to anterior chest; cervical cancer   Thickened endometrium 03/16/2016   Vaginal Pap smear, abnormal    Vitamin D deficiency disease 07/22/2019    Past Surgical History:  Procedure Laterality Date   BIOPSY  11/08/2022   Procedure: BIOPSY;  Surgeon: Harvel Quale, MD;  [facility];  Service: Gastroenterology;;   BREAST ENHANCEMENT SURGERY     CERVICAL BIOPSY     precancerous years ago   CHOLECYSTECTOMY  2011   COLONOSCOPY N/A 05/08/2013   Procedure: COLONOSCOPY;  Surgeon: Rogene Houston, MD;  Location: [facility];  Service: Endoscopy;  Laterality: N/A;  200   ESOPHAGOGASTRODUODENOSCOPY (EGD) WITH PROPOFOL N/A 11/08/2022   Procedure:  ESOPHAGOGASTRODUODENOSCOPY (EGD) WITH PROPOFOL;  Surgeon: Montez Morita, Quillian Quince, MD;  Location: [facility];  Service: Gastroenterology;  Laterality: N/A;  10:15 AM   HYSTEROSCOPY WITH D & C N/A 10/05/2016   Procedure: HYSTEROSCOPY; UTERINE CURETTAGE;  Surgeon: Florian Buff, MD;  Location: [facility];  Service: Gynecology;  Laterality: N/A;   POLYPECTOMY N/A 10/05/2016   Procedure: REMOVAL OF ENDOMETRIAL POLYP;  Surgeon: Florian Buff, MD;  Location: [facility];  Service:  Gynecology;  Laterality: N/A;   TUBAL LIGATION      Family Psychiatric History: Reviewed family psychiatric history from progress note on 02/15/2022.  Family History:  Family History  Problem Relation Age of Onset   Dementia Mother    Hyperlipidemia Mother    Thyroid disease Mother    Hypertension Mother    Schizophrenia Father    Alcohol abuse Father        Schizophrenia   Depression Brother    Mental illness Brother        Schizophrenia (2 brothers); schizophrenia and bipolar (1 brother)   Heart attack Brother    Schizophrenia Brother    Kidney failure Brother    Bipolar disorder Brother    Alcohol abuse Maternal Uncle    Alcohol abuse Paternal Uncle    Alzheimer's disease Maternal Grandfather    Cancer Maternal Grandmother        colon   Diabetes Maternal Grandmother    Heart attack Paternal Grandfather    Heart disease Paternal Grandfather    Diabetes Paternal Grandfather    Mental illness Paternal Grandmother    Polycystic ovary syndrome Daughter    Other Daughter        knee replacement   Thyroid disease Daughter        had radiation on thyroid    Social History: Reviewed social history from progress note on 02/15/2022. Social History   Socioeconomic History   Marital status: Married    Spouse name: Not on file   Number of children: Not on file   Years of education: Not on file   Highest education level: Not on file  Occupational History   Not on file  Tobacco Use   Smoking status: Every Day    Packs/day: 0.50    Years: 37.00    Additional pack years: 0.00    Total pack years: 18.50    Types: Cigarettes    Passive exposure: Current   Smokeless tobacco: Never   Tobacco comments:    smokes 5 cig daily  Vaping Use   Vaping Use: Never used  Substance and Sexual Activity   Alcohol use: Not Currently    Comment: Quit September 2019   Drug use: No   Sexual activity: Yes    Birth control/protection: Post-menopausal  Other Topics Concern   Not on file   Social History Narrative   Married for 49 years.Retired,used to work at [employer].   Social Determinants of Health   Financial Resource Strain: Low Risk  (01/09/2020)   Overall Financial Resource Strain (CARDIA)    Difficulty of Paying Living Expenses: Not hard at all  Food Insecurity: No Food Insecurity (01/09/2020)   Hunger Vital Sign    Worried About Running Out of Food in the Last Year: Never true    Ran Out of Food in the Last Year: Never true  Transportation Needs: No Transportation Needs (01/09/2020)   PRAPARE - Hydrologist (Medical): No    Lack of Transportation (  Non-Medical): No  Physical Activity: Sufficiently Active (01/09/2020)   Exercise Vital Sign    Days of Exercise per Week: 5 days    Minutes of Exercise per Session: 30 min  Stress: No Stress Concern Present (01/09/2020)   [place]    Feeling of Stress : Only a little  Social Connections: Socially Integrated (01/09/2020)   Social Connection and Isolation Panel [NHANES]    Frequency of Communication with Friends and Family: Once a week    Frequency of Social Gatherings with Friends and Family: More than three times a week    Attends Religious Services: More than 4 times per year    Active Member of Clubs or Organizations: Yes    Attends Music therapist: More than 4 times per year    Marital Status: Married    Allergies: No Known Allergies  Metabolic Disorder Labs: No results found for: "HGBA1C", "MPG" No results found for: "PROLACTIN" Lab Results  Component Value Date   CHOL 239 (H) 01/21/2021   TRIG 86 01/21/2021   HDL 65 01/21/2021   CHOLHDL 3.7 01/21/2021   LDLCALC 155 (H) 01/21/2021   Elim 134 (H) 05/28/2020   Lab Results  Component Value Date   TSH 1.070 03/07/2022    Therapeutic Level Labs: No results found for: "LITHIUM" No results found for: "VALPROATE" No results found  for: "CBMZ"  Current Medications: Current Outpatient Medications  Medication Sig Dispense Refill   calcium-vitamin D (OSCAL WITH D) 500-5 MG-MCG tablet Take 1 tablet by mouth daily with breakfast.     cholestyramine (QUESTRAN) 4 g packet Take 2 packets (8 g total) by mouth 2 (two) times daily. 180 each 2   DULoxetine (CYMBALTA) 20 MG capsule Take 1 capsule (20 mg total) by mouth daily. 30 capsule 1   famotidine (PEPCID) 40 MG tablet Take 1 tablet (40 mg total) by mouth 2 (two) times daily. 60 tablet 3   meloxicam (MOBIC) 7.5 MG tablet Take 15 mg by mouth daily.     Multiple Vitamin (MULTIVITAMIN WITH MINERALS) TABS tablet Take 1 tablet by mouth daily. Centrum woman     prazosin (MINIPRESS) 1 MG capsule TAKE 1 CAPSULE AT BEDTIME 90 capsule 3   [START ON 12/28/2022] QUEtiapine (SEROQUEL) 25 MG tablet Take 1 tablet (25 mg total) by mouth at bedtime. 30 tablet 1   simvastatin (ZOCOR) 5 MG tablet Take 5 mg by mouth at bedtime.     sucralfate (CARAFATE) 1 g tablet Take 1 tablet (1 g total) by mouth 4 (four) times daily -  with meals and at bedtime for 21 days. 84 tablet 0   propranolol (INDERAL) 10 MG tablet TAKE (1) TABLET BY MOUTH TWICE A DAY AS NEEDED FOR ANXIETY. (Patient not taking: Reported on 12/21/2022) 180 tablet 1   No current facility-administered medications for this visit.     Musculoskeletal: Strength & Muscle Tone: within normal limits Gait & Station: normal Patient leans: N/A  Psychiatric Specialty Exam: Review of Systems  Constitutional:  Positive for fatigue.  Gastrointestinal:  Positive for diarrhea.  Psychiatric/Behavioral:  Positive for dysphoric mood and sleep disturbance. The patient is nervous/anxious.   All other systems reviewed and are negative.   Blood pressure 136/77, pulse 81, temperature (!) 97.3 F (36.3 C), temperature source Skin, height 5\' 2"  (1.575 m), weight 113 lb 12.8 oz (51.6 kg).Body mass index is 20.81 kg/m.  General Appearance: Casual  Eye  Contact:  Fair  Speech:  Normal Rate  Volume:  Normal  Mood:  Anxious and Depressed  Affect:  Tearful  Thought Process:  Goal Directed and Descriptions of Associations: Intact  Orientation:  Full (Time, Place, and Person)  Thought Content: Rumination   Suicidal Thoughts:  No  Homicidal Thoughts:  No  Memory:  Immediate;   Fair Recent;   Fair Remote;   Fair  Judgement:  Fair  Insight:  Fair  Psychomotor Activity:  Normal  Concentration:  Concentration: Fair and Attention Span: Fair  Recall:  [employer] of Knowledge: Fair  Language: Fair  Akathisia:  No  Handed:  Right  AIMS (if indicated): not done  Assets:  Communication Skills Desire for Improvement Housing Social Support  ADL's:  Intact  Cognition: WNL  Sleep:  Poor   Screenings: [place] Office Visit from 05/11/2022 in [facility] Total Score 0      [place] Office Visit from 08/24/2022 in [facility] from 06/02/2022 in [place] at [place] Visit from 05/11/2022 in [facility] Office Visit from 02/15/2022 in [facility] Office Visit from 01/09/2020 in [facility] for [place] [facility]  Total GAD-7 Score 7 20 5 15 7       PHQ2-9    [place] [facility] from 10/27/2022 in [place] at [facility] from 08/24/2022 in [facility] Counselor from 06/02/2022 in [facility] at [place] from 05/11/2022 in [place] Office Visit from 03/15/2022 in [facility]  PHQ-2 Total Score 0 3 5 2 4   PHQ-9 Total Score -- 10 18 8 10       Flowsheet Row  Admission (Discharged) from 12/14/2022 in [facility] Admission (Discharged) from 11/08/2022 in [facility] from 11/03/2022 in [facility] No Risk No Risk No Risk        Assessment and Plan: SHEROLYN PEARSON is a 72 year old Caucasian female who has a history of PTSD, GAD was evaluated in office today.  Patient with recent GI problems, diarrhea, relationship struggles currently struggling with worsening mood symptoms, sleep problems, will benefit from the following plan.  Plan PTSD-unstable However since patient is currently having GI problems, possibility of higher dosage of Cymbalta causing it, will reduce Cymbalta to 20 mg p.o. daily Prazosin 1 mg p.o. nightly Start Seroquel 25 mg p.o. nightly Will refer for MH IOP-I have communicated with Ms. Dellia Nims in [place].   GAD-unstable Reduce Cymbalta to 20 mg p.o. daily due to GI problems Propranolol 10 mg p.o. twice daily as needed for anxiety Continue CBT with Mr. Maye Hides Referred for [place] IOP  DD-unstable Start Seroquel 25 mg p.o. nightly Review Cymbalta as noted above Referral for MH IOP  Tobacco use disorder-unstable Will monitor closely  Follow-up in clinic in 3 to 4 weeks or sooner if needed.  Collaboration of Care: Collaboration of Care: {[facility] OP Collaboration of Care:[id number]}  Patient/Guardian was advised Release of Information must be obtained prior to any record release in order to collaborate their care with an outside provider. Patient/Guardian was advised if they have not already done so to contact the registration department to sign all necessary forms  in order for [place] to release information regarding their care.   Consent: Patient/Guardian gives verbal consent for treatment and assignment of benefits for services provided during this visit. Patient/Guardian expressed understanding and agreed to proceed.    Ursula Alert,  MD 12/21/2022, 10:04 AM

## 2022-12-21 NOTE — Telephone Encounter (Signed)
D:  Dr. Shea Evans referred pt to [facility].  A:  Placed call to orient pt, but she and husband share the cell phone and he stated that he wasn't home at the time.  Case manager will call back at 3pm.  Inform Dr. Shea Evans.

## 2022-12-21 NOTE — Patient Instructions (Signed)
Quetiapine Tablets What is this medication? QUETIAPINE (kwe TYE a peen) treats schizophrenia and bipolar disorder. It works by balancing the levels of dopamine and serotonin in your brain, hormones that help regulate mood, behaviors, and thoughts. It belongs to a group of medications called antipsychotics. Antipsychotic medications can be used to treat several kinds of mental health conditions. This medicine may be used for other purposes; ask your health care provider or pharmacist if you have questions. COMMON BRAND NAME(S): Seroquel What should I tell my care team before I take this medication? They need to know if you have any of these conditions: Blockage in your bowels Cataracts Constipation Dementia Diabetes Difficulty swallowing Glaucoma Heart disease High levels of prolactin History of breast cancer History of irregular heartbeat Liver disease Low blood cell levels (white cells, red cells, and platelets) Low blood pressure Parkinson disease Prostate disease Seizures Suicidal thoughts, plans, or attempt by you or a family member Thyroid disease Trouble passing urine An unusual or allergic reaction to quetiapine, other medications, foods, dyes, or preservatives Pregnant or trying to get pregnant Breastfeeding How should I use this medication? Take this medication by mouth with water. Take it as directed on the prescription label at the same time every day. You can take it with or without food. If it upsets your stomach, take it with food. Keep taking it unless your care team tells you to stop. A special MedGuide will be given to you by the pharmacist with each prescription and refill. Be sure to read this information carefully each time. Talk to your care team about the use of this medication in children. While this medication may be prescribed for children as young as 10 years for selected conditions, precautions do apply. People over 65 years of age may have a stronger  reaction to this medication and need smaller doses. Overdosage: If you think you have taken too much of this medicine contact a poison control center or emergency room at once. NOTE: This medicine is only for you. Do not share this medicine with others. What if I miss a dose? If you miss a dose, take it as soon as you can. If it is almost time for your next dose, take only that dose. Do not take double or extra doses. What may interact with this medication? Do not take this medication with any of the following: Cisapride Dronedarone Metoclopramide Pimozide Thioridazine This medication may also interact with the following: Alcohol Antihistamines for allergy, cough, and cold Atropine Avasimibe Certain antivirals for HIV or hepatitis Certain medications for anxiety or sleep Certain medications for bladder problems, such as oxybutynin, tolterodine Certain medications for depression, such as amitriptyline, fluoxetine, nefazodone, sertraline Certain medications for fungal infections, such as fluconazole, ketoconazole, itraconazole, posaconazole Certain medications for stomach problems, such as dicyclomine, hyoscyamine Certain medications for travel sickness, such as scopolamine Cimetidine General anesthetics, such as halothane, isoflurane, methoxyflurane, propofol Ipratropium Levodopa or other medications for Parkinson disease Medications for blood pressure Medications for seizures Medications that relax muscles for surgery Opioid medications for pain Other medications that cause heart rhythm changes Phenothiazines, such as chlorpromazine, prochlorperazine Rifampin St. John's wort This list may not describe all possible interactions. Give your health care provider a list of all the medicines, herbs, non-prescription drugs, or dietary supplements you use. Also tell them if you smoke, drink alcohol, or use illegal drugs. Some items may interact with your medicine. What should I watch for  while using this medication? Visit your care team for regular   checks on your progress. Tell your care team if your symptoms do not start to get better or if they get worse. Do not suddenly stop taking This medication. You may develop a severe reaction. Your care team will tell you how much medication to take. If your care team wants you to stop the medication, the dose may be slowly lowered over time to avoid any side effects. You may need to have an eye exam before and during use of this medication. This medication may increase blood sugar. Ask your care team if changes in diet or medications are needed if you have diabetes. This medication may cause thoughts of suicide or depression. This includes sudden changes in mood, behaviors, or thoughts. These changes can happen at any time but are more common in the beginning of treatment or after a change in dose. Call your care team right away if you experience these thoughts or worsening depression. This medication may affect your coordination, reaction time, or judgment. Do not drive or operate machinery until you know how this medication affects you. Sit up or stand slowly to reduce the risk of dizzy or fainting spells. Drinking alcohol with this medication can increase the risk of these side effects. This medication can cause problems with controlling your body temperature. It can lower the response of your body to cold temperatures. If possible, stay indoors during cold weather. If you must go outdoors, wear warm clothes. It can also lower the response of your body to heat. Do not overheat. Do not over-exercise. Stay out of the sun when possible. If you must be in the sun, wear cool clothing. Drink plenty of water. If you have trouble controlling your body temperature, call your care team right away. What side effects may I notice from receiving this medication? Side effects that you should report to your care team as soon as possible: Allergic  reactions--skin rash, itching, hives, swelling of the face, lips, tongue, or throat Heart rhythm changes--fast or irregular heartbeat, dizziness, feeling faint or lightheaded, chest pain, trouble breathing High blood sugar (hyperglycemia)--increased thirst or amount of urine, unusual weakness or fatigue, blurry vision High fever, stiff muscles, increased sweating, fast or irregular heartbeat, and confusion, which may be signs of neuroleptic malignant syndrome High prolactin level--unexpected breast tissue growth, discharge from the nipple, change in sex drive or performance, irregular menstrual cycle Increase in blood pressure in children Infection--fever, chills, cough, or sore throat Low blood pressure--dizziness, feeling faint or lightheaded, blurry vision Low thyroid levels (hypothyroidism)--unusual weakness or fatigue, increased sensitivity to cold, constipation, hair loss, dry skin, weight gain, feelings of depression Pain or trouble swallowing Seizures Stroke--sudden numbness or weakness of the face, arm, or leg, trouble speaking, confusion, trouble walking, loss of balance or coordination, dizziness, severe headache, change in vision Sudden eye pain or change in vision such as blurry vision, seeing halos around lights, vision loss Thoughts of suicide or self-harm, worsening mood, feelings of depression Trouble passing urine Uncontrolled and repetitive body movements, muscle stiffness or spasms, tremors or shaking, loss of balance or coordination, restlessness, shuffling walk, which may be signs of extrapyramidal symptoms (EPS) Side effects that usually do not require medical attention (report to your care team if they continue or are bothersome): Constipation Dizziness Drowsiness Dry mouth Weight gain This list may not describe all possible side effects. Call your doctor for medical advice about side effects. You may report side effects to FDA at 1-800-FDA-1088. Where should I keep my  medication? Keep out   of the reach of children. Store at room temperature between 15 and 30 degrees C (59 and 86 degrees F). Throw away any unused medication after the expiration date. NOTE: This sheet is a summary. It may not cover all possible information. If you have questions about this medicine, talk to your doctor, pharmacist, or health care provider.  2023 Elsevier/Gold Standard (2022-03-21 00:00:00)   

## 2022-12-22 ENCOUNTER — Ambulatory Visit (INDEPENDENT_AMBULATORY_CARE_PROVIDER_SITE_OTHER): Payer: Medicare Other | Admitting: Clinical

## 2022-12-22 ENCOUNTER — Telehealth (HOSPITAL_COMMUNITY): Payer: Self-pay | Admitting: Psychiatry

## 2022-12-22 DIAGNOSIS — F331 Major depressive disorder, recurrent, moderate: Secondary | ICD-10-CM

## 2022-12-22 DIAGNOSIS — F17200 Nicotine dependence, unspecified, uncomplicated: Secondary | ICD-10-CM

## 2022-12-22 DIAGNOSIS — F4310 Post-traumatic stress disorder, unspecified: Secondary | ICD-10-CM

## 2022-12-22 NOTE — Progress Notes (Signed)
IN PERSON   I connected with Morgan Fields on 12/22/22 at  11:00 AM EDT in person and verified that I am speaking with the correct person using two identifiers.   Location: Patient: Office Provider: Office   I discussed the limitations of evaluation and management by telemedicine and the availability of in person appointments. The patient expressed understanding and agreed to proceed. ( IN PERSON)     THERAPIST PROGRESS NOTE   Session Time: 11:00 AM-11:55 AM   Participation Level: Active   Behavioral Response: CasualAlertDepressed   Type of Therapy: Individual Therapy   Treatment Goals addressed: Coping   Interventions: CBT, DBT, Solution Focused, Strength-based and Supportive   Summary: Morgan Fields is a 72 y.o. female who presents with PTSD as well as  Depression with Anxiety. The OPT therapist worked with the patient for her OPT treatment. The OPT therapist utilized Motivational Interviewing to assist in creating therapeutic repore. The patient in the session was engaged and work in collaboration giving feedback about her stressors over the past few weeks. The patient identified changes that she has been implementing including not putting herself on the back-burner as much and utilizing a time Actuary (planner) which has helped her to section her day, journal, and balance work/leisure time. The patient spoke about change with her husband coming out of retirement going back to work allowing her to have more individual time to invest in herself. The OPT therapist utilized Cognitive Behavioral Therapy through cognitive restructuring as well as worked with the patient on coping strategies to assist in management of mood including ongoing motivation and empowerment for the patient to be as active as possible with her current health limitations and to dedicate part of her time to her own leisure and self care. The patient spoke about being more active in challenging  negative self thought and not looking outward for validation. The patient spoke about changes in her med therapy program yesterday with Dr. Shea Evans and overviewed physical health problems including hernia. The patient spoke about getting good health results from recent colonoscopy.   Suicidal/Homicidal: Nowithout intent/plan   Therapist Response: The OPT therapist worked with the patient for the patients scheduled session. The patient was engaged in her session and gave feedback in relation to triggers, symptoms, and behavior responses over the past few weeks.The OPT therapist worked with the patient utilizing an in session Cognitive Behavioral Therapy exercise. The patient was responsive in the session and verbalized, " I have to keep blocking out the message from being so pre-conditions for so many years that I am not important or I do not measure up if I dont live my life in a certain way". The OPT therapist worked with the patient on balancing her stressors with leisure and utilizing her support network. Managing different sections of her life. The patient is currently working on spring cleaning in the home and verbalized she is going to focus on her leisure time more consistently to help her manage her overall general stress. The patient spoke about ongoing collaboration/work with physical health providers to manage her IBS. The OPT therapist will continue [place] treatment work with the patient in her next scheduled session.     Plan: Return again in 2 weeks.   Diagnosis:      Axis I:  PTSD/Major depressive disorder, recurrent episode, moderate with anxious distress /Tobacco Use  Axis II: No diagnosis     Collaboration of Care: Overview of patient involvement in the med management program with Dr. Shea Evans.   Patient/Guardian was advised Release of Information must be obtained prior to any record release in order to collaborate their care with an outside provider.  Patient/Guardian was advised if they have not already done so to contact the registration department to sign all necessary forms in order for [place] to release information regarding their care.    Consent: Patient/Guardian gives verbal consent for treatment and assignment of benefits for services provided during this visit. Patient/Guardian expressed understanding and agreed to proceed.    I discussed the assessment and treatment plan with the patient. The patient was provided an opportunity to ask questions and all were answered. The patient agreed with the plan and demonstrated an understanding of the instructions.   The patient was advised to call back or seek an in-person evaluation if the symptoms worsen or if the condition fails to improve as anticipated.   I provided 55 minutes of face-to-face time during this encounter.   Lennox Grumbles, LCSW   12/22/2022

## 2022-12-22 NOTE — Telephone Encounter (Signed)
D:  Dr. Shea Evans referred pt to virtual MH-IOP.  A:  Placed call and apologized for not contacting pt at 3 pm yesterday, d/t an emergency.  Oriented pt.  Encouraged pt to verify her insurance benefits.  Pt requesting to wait and start [place] on 12-26-22.  Inform Dr. Shea Evans and Maye Hides, LCSW.  R:  Pt receptive.

## 2022-12-23 ENCOUNTER — Telehealth (HOSPITAL_COMMUNITY): Payer: Self-pay | Admitting: Psychiatry

## 2022-12-23 ENCOUNTER — Encounter (HOSPITAL_COMMUNITY): Payer: Self-pay | Admitting: Gastroenterology

## 2022-12-26 ENCOUNTER — Other Ambulatory Visit (HOSPITAL_COMMUNITY): Payer: Medicare Other | Attending: Licensed Clinical Social Worker | Admitting: Psychiatry

## 2022-12-26 ENCOUNTER — Encounter (HOSPITAL_COMMUNITY): Payer: Self-pay | Admitting: Psychiatry

## 2022-12-26 DIAGNOSIS — Z62810 Personal history of physical and sexual abuse in childhood: Secondary | ICD-10-CM | POA: Diagnosis not present

## 2022-12-26 DIAGNOSIS — F17210 Nicotine dependence, cigarettes, uncomplicated: Secondary | ICD-10-CM | POA: Diagnosis not present

## 2022-12-26 DIAGNOSIS — F17200 Nicotine dependence, unspecified, uncomplicated: Secondary | ICD-10-CM

## 2022-12-26 DIAGNOSIS — Z818 Family history of other mental and behavioral disorders: Secondary | ICD-10-CM | POA: Insufficient documentation

## 2022-12-26 DIAGNOSIS — F331 Major depressive disorder, recurrent, moderate: Secondary | ICD-10-CM | POA: Diagnosis not present

## 2022-12-26 DIAGNOSIS — F4310 Post-traumatic stress disorder, unspecified: Secondary | ICD-10-CM | POA: Diagnosis present

## 2022-12-26 DIAGNOSIS — F411 Generalized anxiety disorder: Secondary | ICD-10-CM | POA: Insufficient documentation

## 2022-12-26 DIAGNOSIS — Z79899 Other long term (current) drug therapy: Secondary | ICD-10-CM | POA: Insufficient documentation

## 2022-12-26 MED ORDER — FLUOXETINE HCL 10 MG PO CAPS: 10.0000 mg | ORAL_CAPSULE | Freq: Every day | ORAL | 0 refills | Status: DC
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
## 2022-12-27 ENCOUNTER — Other Ambulatory Visit (HOSPITAL_COMMUNITY): Payer: Medicare Other | Admitting: Licensed Clinical Social Worker

## 2022-12-27 DIAGNOSIS — F4310 Post-traumatic stress disorder, unspecified: Secondary | ICD-10-CM

## 2022-12-27 DIAGNOSIS — F331 Major depressive disorder, recurrent, moderate: Secondary | ICD-10-CM

## 2022-12-27 NOTE — Progress Notes (Signed)
Virtual Visit via Video Note   I connected with Morgan Fields, who prefers to go by "Morgan Fields" on 12/27/22 at  9:00 AM EDT by a video enabled telemedicine application and verified that I am speaking with the correct person using two identifiers.   At orientation to the IOP program, Case Manager discussed the limitations of evaluation and management by telemedicine and the availability of in person appointments. The patient expressed understanding and agreed to proceed with virtual visits throughout the duration of the program.   Location:  Patient: Patient Home Provider: [facility]   History of Present Illness: MDD and PTSD  Observations/Objective: Check In: Case Manager checked in with all participants to review discharge dates, insurance authorizations, work-related documents and needs from the treatment team regarding medications. Morgan Fields stated needs and engaged in discussion.    Initial Therapeutic Activity: Counselor facilitated a check-in with Morgan Fields to assess for safety, sobriety and medication compliance.  Counselor also inquired about Morgan Fields's current emotional ratings, as well as any significant changes in thoughts, feelings or behavior since previous check in.  Morgan Fields presented for session on time and was alert, oriented x5, with no evidence or self-report of active SI/HI or A/V H.  Morgan Fields reported compliance with medication and denied use of alcohol or illicit substances.  Morgan Fields reported scores of 4/10 for depression, 8/10 for anxiety, and 0/10 for anger/irritability.  Morgan Fields denied any recent outbursts or panic attacks.  Morgan Fields reported that a recent success was having her son visit to celebrate her husband's birthday yesterday evening.  Morgan Fields reported that a recent struggle was having to call maintenance yesterday to do some work around the home, which postponed her nap.  Morgan Fields reported that her goal today is to do some light reading for self-care.       Second Therapeutic  Activity: Counselor introduced [employer], [place] Chaplain to provide psychoeducation on topic of Grief and Loss with members today.  Marchelle Folks began discussion by checking in with the group about their baseline mood today, general thoughts on what grief means to them and how it has affected them personally in the past.  Marchelle Folks provided information on how the process of grief/loss can differ depending upon one's unique culture, and categories of loss one could experience (i.e. loss of a person, animal, relationship, job, identity, etc).  Marchelle Folks encouraged members to be mindful of how pervasive loss can be, and how to recognize signs which could indicate that this is having an impact on one's overall mental health and wellbeing.  Intervention was effective, as evidenced by [facility] participating in discussion with speaker on the subject, reporting that when she was younger there was a loss of stability in her life, as her father left the home when she was age 10, and they had to move around a few times, which made it challenging to form strong friendships.  Morgan Fields reported that something which brings her joy and helps her cope with loss is cuddling up with interesting nonfiction books to read since they bring her comfort.    Third Therapeutic Activity: Counselor introduced topic of assertive communication today.  Counselor shared various handouts with members virtually in group to read along with on the subject.  These handouts defined assertive communication as a communication style in which a person stands up for their own needs and wants, while also taking into consideration the needs and wants of others, without behaving in a passive or aggressive way.  Traits of assertive communicators were  highlighted such as using appropriate speaking volume, maintaining eye contact, using confident language, and avoiding interruption.  Members were also provided with tips on how to improve communication, including  respecting oneself, expressing thoughts and feelings calmly, and saying "No" when necessary.  Members were given a variety of scenarios where they could practice using these tips to respond in an assertive manner.  Intervention was effective, as evidenced by [facility] participating in discussion on topic, reporting that she has a passive communication style due to traits such as being soft spoken/quiet, allowing others to take advantage, lacking confidence, and prioritizing the needs of others.  Morgan Fields reported that this has led to issues such as worsening depression and neglected needs, stating "I'm very passive because I was taught to be that way".  Morgan Fields showed more effective use of assertive communication skills through engagement in roleplay activities.    Assessment and Plan: Counselor recommends that Morgan Fields remain in IOP treatment to better manage mental health symptoms, ensure stability and pursue completion of treatment plan goals. Counselor recommends adherence to crisis/safety plan, taking medications as prescribed, and following up with medical professionals if any issues arise.    Follow Up Instructions: Counselor will send Webex link for session tomorrow.  Morgan Fields was advised to call back or seek an in-person evaluation if the symptoms worsen or if the condition fails to improve as anticipated.   Collaboration of Care:   Medication Management AEB Dr. Eliseo Gum or Hillery Jacks, NP                                          Case Manager AEB Jeri Modena, CNA   Patient/Guardian was advised Release of Information must be obtained prior to any record release in order to collaborate their care with an outside provider. Patient/Guardian was advised if they have not already done so to contact the registration department to sign all necessary forms in order for [place] to release information regarding their care.   Consent: Patient/Guardian gives verbal consent for treatment and assignment of benefits for  services provided during this visit. Patient/Guardian expressed understanding and agreed to proceed.  I provided 180 minutes of non-face-to-face time during this encounter.   Noralee Stain, [place], LCAS 12/27/22

## 2022-12-28 ENCOUNTER — Other Ambulatory Visit (HOSPITAL_COMMUNITY): Payer: Medicare Other

## 2022-12-28 ENCOUNTER — Ambulatory Visit: Payer: Medicare Other | Admitting: Nutrition

## 2022-12-29 ENCOUNTER — Other Ambulatory Visit (HOSPITAL_COMMUNITY): Payer: Medicare Other | Admitting: Psychiatry

## 2022-12-29 DIAGNOSIS — F4310 Post-traumatic stress disorder, unspecified: Secondary | ICD-10-CM

## 2022-12-29 DIAGNOSIS — F331 Major depressive disorder, recurrent, moderate: Secondary | ICD-10-CM

## 2022-12-29 NOTE — Progress Notes (Signed)
Virtual Visit via Video Note   I connected with Robbye K. Glotfelty, who prefers to go by "[name]" on 12/29/22 at  9:00 AM EDT by a video enabled telemedicine application and verified that I am speaking with the correct person using two identifiers.   At orientation to the IOP program, Case Manager discussed the limitations of evaluation and management by telemedicine and the availability of in person appointments. The patient expressed understanding and agreed to proceed with virtual visits throughout the duration of the program.   Location:  Patient: Patient Home Provider: Clinical Home Office   History of Present Illness: MDD and PTSD  Observations/Objective: Check In: Case Manager checked in with all participants to review discharge dates, insurance authorizations, work-related documents and needs from the treatment team regarding medications. [name] stated needs and engaged in discussion.    Initial Therapeutic Activity: Counselor facilitated a check-in with [name] to assess for safety, sobriety and medication compliance.  Counselor also inquired about [name]'s current emotional ratings, as well as any significant changes in thoughts, feelings or behavior since previous check in.  [name] presented for session on time and was alert, oriented x5, with no evidence or self-report of active SI/HI or A/V H.  [name] reported compliance with medication and denied use of alcohol or illicit substances.  [name] reported scores of 2/10 for depression, 1/10 for anxiety, and 0/10 for anger/irritability.  [name] denied any recent outbursts or panic attacks.  [name] reported that a recent success was starting new medications, which seem to be improving her mood and rest at night.  [name] reported that a recent struggle was feeling "Overloaded" by some of the material covered in [facility] recently, stating "I handled it well and know its going to help me in the long run".  [name] reported that her goal today is to  find something indoors to do for self-care, since the weather is expected to be stormy outside.         Second Therapeutic Activity: Counselor introduced topic of mental illness stigma today.  Counselor explained how stigma is defined as someone viewing another person in a negative way due to distinguishing characteristics or traits which are thought to be a disadvantage, including negative beliefs or attitudes associated with people who have a mental health condition such as major depression and/or generalized anxiety.  Counselor explained that stigma can lead to discrimination as well, and the harmful effects of pervasive stigma include reluctance to seek help or treatment, lack of understanding by family, friends, co-workers or peers, bullying/harassment, or negative impact on one's perceived ability to overcome challenges or succeed in life.  Counselor offered several suggestions for coping with stigma regarding mental illness, including staying engaged in treatment through linkage with a therapist, psychiatrist, and/or support group, avoiding isolation, and speaking out against stigma when encountered to reverse societal impact and increase acceptance and understanding.  Counselor inquired about members' experiences with this issue, as well as what they have done to minimize the impact it can have on their lives.  Intervention was effective, as evidenced by [facility] reporting that she had experienced stigma surrounding the subject of mental health growing up, as several family members had challenges with these issues, and she felt like she had to act 'normal' despite symptoms of depression and anxiety she attempted to ignore.  [name] reported that as she got older and could no longer mask these emotions, she sought help, and has found group to be helpful for being herself, and receiving understanding feedback  from peers.  [name] stated "I never really opened up about what I came from, because you weren't  allowed to. I know now that I don't have to be ashamed".    Assessment and Plan: Counselor recommends that [name] remain in IOP treatment to better manage mental health symptoms, ensure stability and pursue completion of treatment plan goals. Counselor recommends adherence to crisis/safety plan, taking medications as prescribed, and following up with medical professionals if any issues arise.    Follow Up Instructions: Counselor will send Webex link for session tomorrow.  [name] was advised to call back or seek an in-person evaluation if the symptoms worsen or if the condition fails to improve as anticipated.   Collaboration of Care:   Medication Management AEB Dr. Eliseo Gum or Hillery Jacks, NP                                          Case Manager AEB Jeri Modena, CNA   Patient/Guardian was advised Release of Information must be obtained prior to any record release in order to collaborate their care with an outside provider. Patient/Guardian was advised if they have not already done so to contact the registration department to sign all necessary forms in order for [place] to release information regarding their care.   Consent: Patient/Guardian gives verbal consent for treatment and assignment of benefits for services provided during this visit. Patient/Guardian expressed understanding and agreed to proceed.  I provided 180 minutes of non-face-to-face time during this encounter.   Noralee Stain, [place], LCAS 12/29/22

## 2022-12-30 ENCOUNTER — Other Ambulatory Visit (HOSPITAL_COMMUNITY): Payer: Medicare Other | Admitting: Licensed Clinical Social Worker

## 2022-12-30 DIAGNOSIS — F4310 Post-traumatic stress disorder, unspecified: Secondary | ICD-10-CM | POA: Diagnosis not present

## 2022-12-30 DIAGNOSIS — F331 Major depressive disorder, recurrent, moderate: Secondary | ICD-10-CM

## 2022-12-30 NOTE — Progress Notes (Signed)
Virtual Visit via Video Note   I connected with Morgan Fields, who prefers to go by "Morgan Fields" on 12/30/22 at  9:00 AM EDT by a video enabled telemedicine application and verified that I am speaking with the correct person using two identifiers.   At orientation to the IOP program, Case Manager discussed the limitations of evaluation and management by telemedicine and the availability of in person appointments. The patient expressed understanding and agreed to proceed with virtual visits throughout the duration of the program.   Location:  Patient: Patient Home Provider: Clinical Home Office   History of Present Illness: MDD and PTSD  Observations/Objective: Check In: Case Manager checked in with all participants to review discharge dates, insurance authorizations, work-related documents and needs from the treatment team regarding medications. Morgan Fields stated needs and engaged in discussion.    Initial Therapeutic Activity: Counselor facilitated a check-in with Morgan Fields to assess for safety, sobriety and medication compliance.  Counselor also inquired about Morgan Fields's current emotional ratings, as well as any significant changes in thoughts, feelings or behavior since previous check in.  Morgan Fields presented for session on time and was alert, oriented x5, with no evidence or self-report of active SI/HI or A/V H.  Morgan Fields reported compliance with medication and denied use of alcohol or illicit substances.  Morgan Fields reported scores of 2/10 for depression, 0/10 for anxiety, and 0/10 for anger/irritability.  Morgan Fields denied any recent outbursts or panic attacks.  Morgan Fields reported that a recent success has been enjoying group since starting [facility].  Morgan Fields reported that an ongoing issue is dealing with IBS, although she has noticed while in group these issues do not seem as severe.  Morgan Fields reported that her goal this weekend is to work on painting the inside and outside of the home depending on her energy, and how the  weather fairs, stating "I'm looking forward to it".         Second Therapeutic Activity: Counselor introduced topic of self-care today.  Counselor explained how this can be defined as the things one does to maintain good health and improve well-being.  Counselor provided members with a self-care assessment form to complete.  This handout featured various sub-categories of self-care, including physical, psychological/emotional, social, spiritual, and professional.  Members were asked to rank their engagement in the activities listed for each dimension on a scale of 1-3, with 1 indicating 'Poor', 2 indicating 'Ok', and 3 indicating 'Well'.  Counselor invited members to share results of their assessment, and inquired about which areas of self-care they are doing well in, as well as areas that require attention, and how they plan to begin addressing this during treatment.  Intervention was effective, as evidenced by Morgan Fields successfully completing initial 2 sections of assessment and actively engaging in discussion on subject, reporting that she is excelling in areas such as eating healthy foods, attending preventative medical appointments, getting away from distractions, learning new things, and recognizing her own strengths and achievements, but would benefit from focusing more on areas such as maintaining personal hygiene, eating regularly, participating in fun activities, taking vacations or daytrips, and doing comforting activities.  Morgan Fields reported that she would work to improve self-care deficits by improving overall hygiene by brushing her hair and hair regularly, focusing on eating smaller meals throughout the day to avoid IBS flareups while maintaining energy, taking walks more often now that the weather is warming up, making plans for a trip with her husband to [place] or [place], and setting aside more time  in her schedule for activities that relax her.      Assessment and Plan: Counselor recommends that  Morgan Fields remain in IOP treatment to better manage mental health symptoms, ensure stability and pursue completion of treatment plan goals. Counselor recommends adherence to crisis/safety plan, taking medications as prescribed, and following up with medical professionals if any issues arise.    Follow Up Instructions: Counselor will send Webex link for session tomorrow.  Morgan Fields was advised to call back or seek an in-person evaluation if the symptoms worsen or if the condition fails to improve as anticipated.   Collaboration of Care:   Medication Management AEB Dr. Eliseo Gum or Hillery Jacks, NP                                          Case Manager AEB Jeri Modena, CNA   Patient/Guardian was advised Release of Information must be obtained prior to any record release in order to collaborate their care with an outside provider. Patient/Guardian was advised if they have not already done so to contact the registration department to sign all necessary forms in order for [place] to release information regarding their care.   Consent: Patient/Guardian gives verbal consent for treatment and assignment of benefits for services provided during this visit. Patient/Guardian expressed understanding and agreed to proceed.  I provided 180 minutes of non-face-to-face time during this encounter.   Noralee Stain, [place], LCAS 12/30/22

## 2023-01-02 ENCOUNTER — Other Ambulatory Visit (HOSPITAL_COMMUNITY): Payer: Medicare Other | Admitting: Licensed Clinical Social Worker

## 2023-01-02 DIAGNOSIS — F331 Major depressive disorder, recurrent, moderate: Secondary | ICD-10-CM

## 2023-01-02 DIAGNOSIS — F4310 Post-traumatic stress disorder, unspecified: Secondary | ICD-10-CM | POA: Diagnosis not present

## 2023-01-02 NOTE — Progress Notes (Signed)
Virtual Visit via Video Note   I connected with Morgan Fields, who prefers to go by "Morgan Fields" on 01/02/23 at  9:00 AM EDT by a video enabled telemedicine application and verified that I am speaking with the correct person using two identifiers.   At orientation to the IOP program, Case Manager discussed the limitations of evaluation and management by telemedicine and the availability of in person appointments. The patient expressed understanding and agreed to proceed with virtual visits throughout the duration of the program.   Location:  Patient: Patient Home Provider: Clinical Home Office   History of Present Illness: MDD and PTSD  Observations/Objective: Check In: Case Manager checked in with all participants to review discharge dates, insurance authorizations, work-related documents and needs from the treatment team regarding medications. Morgan Fields stated needs and engaged in discussion.    Initial Therapeutic Activity: Counselor facilitated a check-in with Morgan Fields to assess for safety, sobriety and medication compliance.  Counselor also inquired about Morgan Fields's current emotional ratings, as well as any significant changes in thoughts, feelings or behavior since previous check in.  Morgan Fields presented for session on time and was alert, oriented x5, with no evidence or self-report of active SI/HI or A/V H.  Morgan Fields reported compliance with medication and denied use of alcohol or illicit substances.  Morgan Fields reported scores of 3/10 for depression, 4/10 for anxiety, and 0/10 for anger/irritability.  Morgan Fields denied any recent panic attacks.  Morgan Fields reported that a success was going to her downtown with her husband over the weekend and socializing.  Morgan Fields reported that a recent struggle was having an outburst over the weekend, stating "It feels like I'm more on edge during the day".  Morgan Fields reported that this may be triggered by hearing issues between she and her husband, which greatly impacts their  communication.  Morgan Fields reported that her goal today is to find something to do for self-care to reduce anxiety.         Second Therapeutic Activity: Counselor introduced topic of stress management today.  Counselor provided definition of stress as feeling tense, overwhelmed, worn out, and/or exhausted, and noted that in small amounts, stress can be motivating until things become too overwhelming to manage.  Counselor also explained how stress can be acute (brief but intense) or chronic (long-lasting) and this can impact the severity of symptoms one can experience in the physical, emotional, and behavioral categories.  Counselor inquired about members' specific stressors, how long they have been prevalent, and the various symptoms that tend to manifest as a result.  Counselor also offered several stress management strategies to help improve members' coping ability, including journaling, gratitude practice, relaxation techniques, and time management tips.  Counselor also explained that research has shown a strong support network composed of trusted family, friends, or community members can increase resilience in times of stress, and inquired about who members can reach out to for help in managing stressors.  Counselor encouraged members to consider discussing stressor 'red flags' with their close supports that can be monitored and strategies for assisting them in times of crisis.  Intervention was effective, as evidenced by Morgan Fields actively participating in discussion on subject, reporting that her most significant stressors include conflict with family, money worries, feeling pain, planning for retirement, and loneliness.  Morgan Fields was able to identify several warning signs related to stress, including digestive issues, teeth grinding, mutism, headaches, frustration, and worrying.  Morgan Fields reported that her stress management goal is to reduce average irritability from 4/10 down to 2/10  in the next 90 days by ensuring  that she gets enough rest and nutrition each day, since this can trigger outbursts.  Morgan Fields also expressed receptiveness to several stress management strategies practiced today in session, including practicing deep breathing, using a stress tracker each day to build insight into stressors, and practicing progressive muscle relaxation.     Assessment and Plan: Counselor recommends that Morgan Fields remain in IOP treatment to better manage mental health symptoms, ensure stability and pursue completion of treatment plan goals. Counselor recommends adherence to crisis/safety plan, taking medications as prescribed, and following up with medical professionals if any issues arise.    Follow Up Instructions: Counselor will send Webex link for session tomorrow.  Morgan Fields was advised to call back or seek an in-person evaluation if the symptoms worsen or if the condition fails to improve as anticipated.   Collaboration of Care:   Medication Management AEB Dr. Eliseo Gum or Hillery Jacks, NP                                          Case Manager AEB Jeri Modena, CNA   Patient/Guardian was advised Release of Information must be obtained prior to any record release in order to collaborate their care with an outside provider. Patient/Guardian was advised if they have not already done so to contact the registration department to sign all necessary forms in order for [place] to release information regarding their care.   Consent: Patient/Guardian gives verbal consent for treatment and assignment of benefits for services provided during this visit. Patient/Guardian expressed understanding and agreed to proceed.  I provided 180 minutes of non-face-to-face time during this encounter.   Noralee Stain, [place], LCAS 01/02/23

## 2023-01-03 ENCOUNTER — Ambulatory Visit
Admission: EM | Admit: 2023-01-03 | Discharge: 2023-01-03 | Disposition: A | Discharge status: Home / Self Care | Payer: Medicare Other | Attending: Family Medicine | Admitting: Family Medicine

## 2023-01-03 ENCOUNTER — Other Ambulatory Visit: Payer: Self-pay

## 2023-01-03 ENCOUNTER — Telehealth (HOSPITAL_COMMUNITY): Payer: Self-pay | Admitting: Student in an Organized Health Care Education/Training Program

## 2023-01-03 ENCOUNTER — Telehealth (HOSPITAL_COMMUNITY): Payer: Self-pay | Admitting: Psychiatry

## 2023-01-03 ENCOUNTER — Other Ambulatory Visit (HOSPITAL_COMMUNITY): Payer: Medicare Other

## 2023-01-03 ENCOUNTER — Encounter: Payer: Self-pay | Admitting: Emergency Medicine

## 2023-01-03 DIAGNOSIS — R112 Nausea with vomiting, unspecified: Secondary | ICD-10-CM

## 2023-01-03 DIAGNOSIS — R1030 Lower abdominal pain, unspecified: Secondary | ICD-10-CM | POA: Diagnosis not present

## 2023-01-03 DIAGNOSIS — R197 Diarrhea, unspecified: Secondary | ICD-10-CM

## 2023-01-03 HISTORY — DX: Diaphragmatic hernia without obstruction or gangrene: K44.9

## 2023-01-03 HISTORY — DX: Irritable bowel syndrome, unspecified: K58.9

## 2023-01-03 LAB — POCT URINALYSIS DIP (MANUAL ENTRY)
Bilirubin, UA: NEGATIVE
Blood, UA: NEGATIVE
Glucose, UA: NEGATIVE mg/dL
Ketones, POC UA: NEGATIVE mg/dL
Leukocytes, UA: NEGATIVE
Nitrite, UA: NEGATIVE
Protein Ur, POC: NEGATIVE mg/dL
Spec Grav, UA: 1.01 (ref 1.010–1.025)
Urobilinogen, UA: 0.2 E.U./dL
pH, UA: 6 (ref 5.0–8.0)

## 2023-01-03 NOTE — Telephone Encounter (Signed)
Patient husband called Jeri Modena and was concerned about patient having really bad diarrhea and vomiting In the last 24hrs.  Provider called patient back, and patient endorsed that she is having violent diarrhea that started last night. Patient reports that she was having severe pain in her lower abdomen last night. She then had her first of episode of emesis later in the night. She reports that it was projectile like. She reports that the pain in her abdomen is sharp that increased in intensity. She reports that she got relief from vomiting. She reports that the pain I shooting to the left groin area and was moving to the left side underneath her rib.   She reports that he abdomen is hard more in the center and pain to palpitations. She denies BRBPR she reports that her stool is dark but this is unchanged. She reports that this AM the toll became more bile and yellowish diarrhea.   She took her antidiarrheal this AM. She reports that she started her Prozac last week around 4/8 and she actually felt her stomach was calming down and her depression was improving. She did not notice any changes in her bowels really at this time. She reports that she started the quetiapine after on the 4/10.    Patient reports that she did use some butter last night and she has not used this in some time. She has been told that she may have lactose intolerance. She has been avoiding lactose in the last 3-4 months.    Recommend going to urgent care for physical exam also recommend getting K+ and lipase checked. Patient does not think her medication caused this.   PGY-3 Eliseo Gum, MD

## 2023-01-03 NOTE — Discharge Instructions (Addendum)
You have been seen today for abdominal pain. Your evaluation was not suggestive of any emergent condition requiring medical intervention at this time. However, some abdominal problems make take more time to appear. Therefore, it is very important for you to pay attention to any new symptoms or worsening of your current condition.  Please return here or to the Emergency Department immediately should you begin to feel worse in any way or have any of the following symptoms: increasing or different abdominal pain, persistent vomiting, inability to drink fluids, fevers, or shaking chills.   You have had labs (blood tests) sent today. We will call you with any significant abnormalities or if there is need to begin or change treatment or pursue further follow up.  You may also review your test results online through MyChart. If you do not have a MyChart account, instructions to sign up should be on your discharge paperwork.  

## 2023-01-03 NOTE — ED Triage Notes (Signed)
Pt reports new left lower abdominal pain,nausea, emesis since last night. Pt reports diagnosed with IBS and continues to have diarrhea since August 2023 and is unsure if related. States abdomen was "tight" last night.   Denies any fevers.

## 2023-01-03 NOTE — Telephone Encounter (Signed)
D:  Pt's husband phoned stating that pt wouldn't be attending virtual MH-IOP today d/t illness.  According to Mr. Peabody, pt has been up all night with stomach issues (ie. Severe abdominal pain, vomiting, and diarrhea).  "I believe it may be that new medication that you all started her on."  A:  Redirected pt/husband to contact GI doctor b/c pt was having stomach issues before any new medication.  Also, redirected pt/husband back to Dr. Elna Breslow and Dr. Eliseo Gum, since they said it was something she had started.  Inform Dr. Eliseo Gum and Dr. Elna Breslow.  R:  Pt receptive.

## 2023-01-03 NOTE — ED Provider Notes (Signed)
[facility]   [id number] 01/03/23 Arrival Time: 1258  ASSESSMENT & PLAN:  1. Lower abdominal pain   2. Diarrhea, unspecified type   3. Nausea and vomiting, unspecified vomiting type    Reports she is feeling much better today vs last evening. Is comfortable with home observation. Declines ED evaluation. Agrees to ED evaluation should she experience any worsening of pain or new symptoms or fever. Cannot r/o acute diverticulitis.  Labs Reviewed  POCT URINALYSIS DIP (MANUAL ENTRY) - Abnormal; Notable for the following components:      Result Value   Color, UA light yellow (*)    All other components within normal limits  CBC WITH DIFFERENTIAL/PLATELET  COMPREHENSIVE METABOLIC PANEL  LIPASE, BLOOD   Blood work pending. Will notify of any significant results. OTC Tylenol if needed. Declines Rx Zofran; has some at home.    Discharge Instructions      You have been seen today for abdominal pain. Your evaluation was not suggestive of any emergent condition requiring medical intervention at this time. However, some abdominal problems make take more time to appear. Therefore, it is very important for you to pay attention to any new symptoms or worsening of your current condition.  Please return here or to the Emergency Department immediately should you begin to feel worse in any way or have any of the following symptoms: increasing or different abdominal pain, persistent vomiting, inability to drink fluids, fevers, or shaking chills.   You have had labs (blood tests) sent today. We will call you with any significant abnormalities or if there is need to begin or change treatment or pursue further follow up.  You may also review your test results online through MyChart. If you do not have a MyChart account, instructions to sign up should be on your discharge paperwork.     Follow-up Information     Schedule an appointment as soon as possible for a visit  with Nsumanganyi,  Colleen Can, NP.   Contact information: [address] [place] [address] [phone]         [facility] Department at [facility].   Specialty: Emergency Medicine Why: If symptoms worsen in any way. Contact information: [address] [id number] [place] [address] [phone]                Reviewed expectations re: course of current medical issues. Questions answered. Outlined signs and symptoms indicating need for more acute intervention. Patient verbalized understanding. After Visit Summary given.   SUBJECTIVE: History from: patient. Morgan Fields is a 72 y.o. female who presents with complaint of Pt reports new left lower abdominal pain, nausea, emesis since last night. Pt reports diagnosed with IBS and continues to have non-bloody diarrhea since August 2023 and is unsure if related. States abdomen was "tight" last night.  Denies any fevers.  No tx PTA.  No LMP recorded. Patient is postmenopausal.  Past Surgical History:  Procedure Laterality Date   BIOPSY  11/08/2022   Procedure: BIOPSY;  Surgeon: Dolores Frame, MD;  [facility];  Service: Gastroenterology;;   BIOPSY  12/14/2022   Procedure: BIOPSY;  Surgeon: Dolores Frame, MD;  [facility];  Service: Gastroenterology;;   BREAST ENHANCEMENT SURGERY     CERVICAL BIOPSY     precancerous years ago   CHOLECYSTECTOMY  2011   COLONOSCOPY N/A 05/08/2013   Procedure: COLONOSCOPY;  Surgeon: Malissa Hippo, MD;  Location: [facility];  Service: Endoscopy;  Laterality: N/A;  200   COLONOSCOPY WITH PROPOFOL N/A 12/14/2022   Procedure: COLONOSCOPY WITH PROPOFOL;  Surgeon: Dolores Frame, MD;  Location: [facility];  Service: Gastroenterology;  Laterality: N/A;  9:30am;ASA 2   ESOPHAGOGASTRODUODENOSCOPY (EGD) WITH PROPOFOL N/A 11/08/2022   Procedure: ESOPHAGOGASTRODUODENOSCOPY (EGD) WITH PROPOFOL;   Surgeon: Dolores Frame, MD;  [facility];  Service: Gastroenterology;  Laterality: N/A;  10:15 AM   HYSTEROSCOPY WITH D & C N/A 10/05/2016   Procedure: HYSTEROSCOPY; UTERINE CURETTAGE;  Surgeon: Lazaro Arms, MD;  Location: [facility];  Service: Gynecology;  Laterality: N/A;   POLYPECTOMY N/A 10/05/2016   Procedure: REMOVAL OF ENDOMETRIAL POLYP;  Surgeon: Lazaro Arms, MD;  Location: [facility];  Service: Gynecology;  Laterality: N/A;   TUBAL LIGATION       OBJECTIVE:  Vitals:   01/03/23 1311  BP: 118/79  Pulse: 95  Resp: 20  Temp: 98 F (36.7 C)  TempSrc: Oral  SpO2: 94%    General appearance: alert, oriented, no acute distress HEENT: [place]; AT; oropharynx moist Lungs: unlabored respirations Abdomen: soft; without distention; mild  and poorly localized tenderness to palpation over lower abdomen ; normal bowel sounds; without masses or organomegaly; without guarding or rebound tenderness Back: without reported CVA tenderness; FROM at waist Extremities: without LE edema; symmetrical; without gross deformities Skin: warm and dry Neurologic: normal gait Psychological: alert and cooperative; normal mood and affect  Labs: Results for orders placed or performed during the hospital encounter of 01/03/23  POCT urinalysis dipstick  Result Value Ref Range   Color, UA light yellow (A) yellow   Clarity, UA clear clear   Glucose, UA negative negative mg/dL   Bilirubin, UA negative negative   Ketones, POC UA negative negative mg/dL   Spec Grav, UA 1.610 9.604 - 1.025   Blood, UA negative negative   pH, UA 6.0 5.0 - 8.0   Protein Ur, POC negative negative mg/dL   Urobilinogen, UA 0.2 0.2 or 1.0 E.U./dL   Nitrite, UA Negative Negative   Leukocytes, UA Negative Negative   Labs Reviewed  POCT URINALYSIS DIP (MANUAL ENTRY) - Abnormal; Notable for the following components:      Result Value   Color, UA light yellow (*)    All other components within normal limits  CBC  WITH DIFFERENTIAL/PLATELET  COMPREHENSIVE METABOLIC PANEL  LIPASE, BLOOD    No Known Allergies                                             Past Medical History:  Diagnosis Date   Complication of anesthesia    takes a long time to wake up from anesthesia   Depression    Hiatal hernia    HLD (hyperlipidemia) 07/22/2019   IBS (irritable bowel syndrome)    Osteoporosis 07/22/2019   PMB (postmenopausal bleeding) 03/11/2016   Post-menopause on HRT (hormone replacement therapy) 03/11/2016   Skin cancer    Recent to anterior chest; cervical cancer   Thickened endometrium 03/16/2016   Vaginal Pap smear, abnormal    Vitamin D deficiency disease 07/22/2019    Social History   Socioeconomic History   Marital status: Married    Spouse name: Not on file   Number of children: Not on file   Years of education: Not on file  Highest education level: Not on file  Occupational History   Not on file  Tobacco Use   Smoking status: Every Day    Packs/day: 0.50    Years: 37.00    Additional pack years: 0.00    Total pack years: 18.50    Types: Cigarettes    Passive exposure: Current   Smokeless tobacco: Never   Tobacco comments:    smokes 5 cig daily  Vaping Use   Vaping Use: Never used  Substance and Sexual Activity   Alcohol use: Not Currently    Comment: Quit September 2019   Drug use: No   Sexual activity: Yes    Birth control/protection: Post-menopausal  Other Topics Concern   Not on file  Social History Narrative   Married for 49 years.Retired,used to work at [employer].   Social Determinants of Health   Financial Resource Strain: Low Risk  (01/09/2020)   Overall Financial Resource Strain (CARDIA)    Difficulty of Paying Living Expenses: Not hard at all  Food Insecurity: No Food Insecurity (01/09/2020)   Hunger Vital Sign    Worried About Running Out of Food in the Last Year: Never true    Ran Out of Food in the Last Year: Never true  Transportation Needs:  No Transportation Needs (01/09/2020)   PRAPARE - Administrator, Civil Service (Medical): No    Lack of Transportation (Non-Medical): No  Physical Activity: Sufficiently Active (01/09/2020)   Exercise Vital Sign    Days of Exercise per Week: 5 days    Minutes of Exercise per Session: 30 min  Stress: No Stress Concern Present (01/09/2020)   [employer] of Occupational Health - Occupational Stress Questionnaire    Feeling of Stress : Only a little  Social Connections: Socially Integrated (01/09/2020)   Social Connection and Isolation Panel [NHANES]    Frequency of Communication with Friends and Family: Once a week    Frequency of Social Gatherings with Friends and Family: More than three times a week    Attends Religious Services: More than 4 times per year    Active Member of [employer] or Organizations: Yes    Attends Engineer, structural: More than 4 times per year    Marital Status: Married  Catering manager Violence: Not At Risk (01/09/2020)   Humiliation, Afraid, Rape, and Kick questionnaire    Fear of Current or Ex-Partner: No    Emotionally Abused: No    Physically Abused: No    Sexually Abused: No    Family History  Problem Relation Age of Onset   Dementia Mother    Hyperlipidemia Mother    Thyroid disease Mother    Hypertension Mother    Schizophrenia Father    Alcohol abuse Father        Schizophrenia   Depression Brother    Mental illness Brother        Schizophrenia (2 brothers); schizophrenia and bipolar (1 brother)   Heart attack Brother    Schizophrenia Brother    Kidney failure Brother    Bipolar disorder Brother    Alcohol abuse Maternal Uncle    Alcohol abuse Paternal Uncle    Alzheimer's disease Maternal Grandfather    Cancer Maternal Grandmother        colon   Diabetes Maternal Grandmother    Heart attack Paternal Grandfather    Heart disease Paternal Grandfather    Diabetes Paternal Grandfather    Mental illness Paternal  Grandmother  Polycystic ovary syndrome Daughter    Other Daughter        knee replacement   Thyroid disease Daughter        had radiation on thyroid     Mardella Layman, MD 01/04/23 1124

## 2023-01-04 ENCOUNTER — Other Ambulatory Visit (HOSPITAL_COMMUNITY): Payer: Medicare Other | Admitting: Licensed Clinical Social Worker

## 2023-01-04 DIAGNOSIS — F4310 Post-traumatic stress disorder, unspecified: Secondary | ICD-10-CM | POA: Diagnosis not present

## 2023-01-04 DIAGNOSIS — F331 Major depressive disorder, recurrent, moderate: Secondary | ICD-10-CM

## 2023-01-04 NOTE — Progress Notes (Signed)
Virtual Visit via Video Note   I connected with Morgan Fields, who prefers to go by "Morgan Fields" on 01/04/23 at  9:00 AM EDT by a video enabled telemedicine application and verified that I am speaking with the correct person using two identifiers.   At orientation to the IOP program, Case Manager discussed the limitations of evaluation and management by telemedicine and the availability of in person appointments. The patient expressed understanding and agreed to proceed with virtual visits throughout the duration of the program.   Location:  Patient: Patient Home Provider: [facility]   History of Present Illness: MDD and PTSD  Observations/Objective: Check In: Case Manager checked in with all participants to review discharge dates, insurance authorizations, work-related documents and needs from the treatment team regarding medications. Morgan Fields stated needs and engaged in discussion.    Initial Therapeutic Activity: Counselor facilitated a check-in with Morgan Fields to assess for safety, sobriety and medication compliance.  Counselor also inquired about Morgan Fields's current emotional ratings, as well as any significant changes in thoughts, feelings or behavior since previous check in.  Morgan Fields presented for session on time and was alert, oriented x5, with no evidence or self-report of active SI/HI or A/V H.  Morgan Fields reported compliance with medication and denied use of alcohol or illicit substances.  Morgan Fields reported scores of 1/10 for depression, 1/10 for anxiety, and 0/10 for anger/irritability.  Morgan Fields denied any recent outbursts or panic attacks.  Morgan Fields reported that a recent success has been making an effort to practice her coping skills more frequently when feeling distressed.  Morgan Fields reported that a recent struggle was having to go to urgent care yesterday due to diverticulitis, which caused her to miss group.  Morgan Fields reported that her goal today is to work on her garden for awhile before it rains.          Second Therapeutic Activity: Counselor utilized a Cabin crew with group members today to guide discussion on topic of codependency.  This handout defined codependency as excessive emotional or psychological reliance upon someone who requires support on account of an illness or addiction.  It also explained how this issue presents in dysfunctional family systems, including behavior such as denying existence of problems, rigid boundaries on communication, strained trust, lack of individuality, and reinforcement of unhealthy coping mechanisms such as substance use.  Characteristics of co-dependent people were listed for assistance with identification, such as extreme need for approval/recognition, difficulty identifying feelings, poor communication, and more.  Members were also tasked with completing a questionnaire in order to identify signs of codependency and results were discussed afterward.  This handout also offered strategies for resolving co-dependency within one's network, including increased use of assertive communication skills in order to set appropriate boundaries.  Intervention was effective, as evidenced by Morgan Fields actively participating in discussion on the subject, and completing codependency questionnaire, with 20 out of 20 positive responses.  Morgan Fields reported that she could relate to numerous codependent traits that were listed, and felt that these patterns began in her childhood due to a dysfunctional household.  Morgan Fields stated "I'm very codependent.  I found that out from therapy in the past.  When I grew up my family was very codependent.  My mom had to take care of [place] and didn't have a lot of help so I learned by watching".  She reported that her goal will be to work on improving communication skills and self-esteem during [facility] so that she can begin expressing her feelings more directly  with supports that may have taken advantage of her kindness at times.  She stated "Talking to the  group like this gives me confidence".     Assessment and Plan: Counselor recommends that Morgan Fields remain in IOP treatment to better manage mental health symptoms, ensure stability and pursue completion of treatment plan goals. Counselor recommends adherence to crisis/safety plan, taking medications as prescribed, and following up with medical professionals if any issues arise.    Follow Up Instructions: Counselor will send Webex link for session tomorrow.  Morgan Fields was advised to call back or seek an in-person evaluation if the symptoms worsen or if the condition fails to improve as anticipated.   Collaboration of Care:   Medication Management AEB Dr. Eliseo Gum or Hillery Jacks, NP                                          Case Manager AEB Jeri Modena, CNA   Patient/Guardian was advised Release of Information must be obtained prior to any record release in order to collaborate their care with an outside provider. Patient/Guardian was advised if they have not already done so to contact the registration department to sign all necessary forms in order for [place] to release information regarding their care.   Consent: Patient/Guardian gives verbal consent for treatment and assignment of benefits for services provided during this visit. Patient/Guardian expressed understanding and agreed to proceed.  I provided 180 minutes of non-face-to-face time during this encounter.   Noralee Stain, [place], LCAS 01/04/23

## 2023-01-05 ENCOUNTER — Other Ambulatory Visit (HOSPITAL_COMMUNITY): Payer: Medicare Other | Admitting: Licensed Clinical Social Worker

## 2023-01-05 DIAGNOSIS — F331 Major depressive disorder, recurrent, moderate: Secondary | ICD-10-CM

## 2023-01-05 DIAGNOSIS — F4310 Post-traumatic stress disorder, unspecified: Secondary | ICD-10-CM

## 2023-01-05 LAB — CBC WITH DIFFERENTIAL/PLATELET
Basophils Absolute: 0.1 10*3/uL (ref 0.0–0.2)
Basos: 0 %
EOS (ABSOLUTE): 0.2 10*3/uL (ref 0.0–0.4)
Eos: 1 %
Hematocrit: 42.2 % (ref 34.0–46.6)
Hemoglobin: 14.1 g/dL (ref 11.1–15.9)
Immature Grans (Abs): 0.1 10*3/uL (ref 0.0–0.1)
Immature Granulocytes: 1 %
Lymphocytes Absolute: 2.4 10*3/uL (ref 0.7–3.1)
Lymphs: 14 %
MCH: 30 pg (ref 26.6–33.0)
MCHC: 33.4 g/dL (ref 31.5–35.7)
MCV: 90 fL (ref 79–97)
Monocytes Absolute: 1.3 10*3/uL — ABNORMAL HIGH (ref 0.1–0.9)
Monocytes: 7 %
Neutrophils Absolute: 13.3 10*3/uL — ABNORMAL HIGH (ref 1.4–7.0)
Neutrophils: 77 %
Platelets: 314 10*3/uL (ref 150–450)
RBC: 4.7 x10E6/uL (ref 3.77–5.28)
RDW: 12 % (ref 11.7–15.4)
WBC: 17.3 10*3/uL — ABNORMAL HIGH (ref 3.4–10.8)

## 2023-01-05 LAB — COMPREHENSIVE METABOLIC PANEL
ALT: 53 IU/L — ABNORMAL HIGH (ref 0–32)
AST: 55 IU/L — ABNORMAL HIGH (ref 0–40)
Albumin/Globulin Ratio: 2 (ref 1.2–2.2)
Albumin: 4.7 g/dL (ref 3.9–4.9)
Alkaline Phosphatase: 80 IU/L (ref 44–121)
BUN/Creatinine Ratio: 10 — ABNORMAL LOW (ref 12–28)
BUN: 9 mg/dL (ref 8–27)
Bilirubin Total: 0.4 mg/dL (ref 0.0–1.2)
CO2: 19 mmol/L — ABNORMAL LOW (ref 20–29)
Calcium: 9.9 mg/dL (ref 8.7–10.3)
Chloride: 103 mmol/L (ref 96–106)
Creatinine, Ser: 0.86 mg/dL (ref 0.57–1.00)
Globulin, Total: 2.4 g/dL (ref 1.5–4.5)
Glucose: 105 mg/dL — ABNORMAL HIGH (ref 70–99)
Potassium: 3.7 mmol/L (ref 3.5–5.2)
Sodium: 139 mmol/L (ref 134–144)
Total Protein: 7.1 g/dL (ref 6.0–8.5)
eGFR: 73 mL/min/{1.73_m2} (ref >59)

## 2023-01-05 NOTE — Progress Notes (Signed)
Virtual Visit via Video Note   I connected with Morgan Fields, who prefers to go by "Morgan Fields" on 01/05/23 at  9:00 AM EDT by a video enabled telemedicine application and verified that I am speaking with the correct person using two identifiers.   At orientation to the IOP program, Case Manager discussed the limitations of evaluation and management by telemedicine and the availability of in person appointments. The patient expressed understanding and agreed to proceed with virtual visits throughout the duration of the program.   Location:  Patient: Patient Home Provider: [facility]   History of Present Illness: MDD and PTSD  Observations/Objective: Check In: Case Manager checked in with all participants to review discharge dates, insurance authorizations, work-related documents and needs from the treatment team regarding medications. Morgan Fields stated needs and engaged in discussion.    Initial Therapeutic Activity: Counselor facilitated a check-in with Morgan Fields to assess for safety, sobriety and medication compliance.  Counselor also inquired about Morgan Fields's current emotional ratings, as well as any significant changes in thoughts, feelings or behavior since previous check in.  Morgan Fields presented for session on time and was alert, oriented x5, with no evidence or self-report of active SI/HI or A/V H.  Morgan Fields reported compliance with medication and denied use of alcohol or illicit substances.  Morgan Fields reported scores of 2/10 for depression, 3/10 for anxiety, and 0/10 for anger/irritability.  Morgan Fields denied any recent outbursts or panic attacks.  Morgan Fields reported that a recent struggle was discovering how significant of an issue that codependency is for her based upon the material covered in group yesterday, although she was able to practice her coping skills to manage anxiety and depression related to this.  Morgan Fields reported that an additional success was noticing an improvement in communication with her  husband.   Second Therapeutic Activity: Counselor introduced topic of self-esteem today and defined this as the value an individual places on oneself, based upon assessment of personal worth as a human being and approval/disapproval of one's behavior. Counselor asked members to assess their level of self-esteem at this time based upon common indicators of high self-esteem, including: accepting oneself unconditionally;  having self-respect and deep seated belief that one matters; being unaffected by other people's opinions/criticisms; and showing good control over emotions.  Counselor also explained concept of one's inner critic which serves to highlight faults and minimize strengths, directly influencing low sense of self-esteem.  Counselor then provided handout on 'strengths and qualities', which featured questions to guide discussion and increase awareness of each member's unique individual abilities which could reinforce higher self-esteem. Examples of questions included: 'things I am good at', 'challenges I have overcome', and 'what I like about myself'.  Intervention was effective, as evidenced by Morgan Fields actively engaging in discussion on topic, and completing a self-esteem assessment, receiving a score of 17, which indicated a 'moderate' level of self-esteem at this time due to mixed traits such as taking responsibility for her life choices, feeling proud of accomplishments, but acting in a passive manner around others at time, and having a poorly defined self-identity and changing personality to fit different situations.  Morgan Fields stated "I thought my self-esteem score would be lower.  I think it has gotten better as I've gotten older and matured".  Morgan Fields was receptive to several strategies offered today for increasing self-esteem during treatment, including more time in her schedule for self-care activities like showers, makeup and dressing nicely; and building up her support network to include more  positive people that  uplift her.  Assessment and Plan: Counselor recommends that Morgan Fields remain in IOP treatment to better manage mental health symptoms, ensure stability and pursue completion of treatment plan goals. Counselor recommends adherence to crisis/safety plan, taking medications as prescribed, and following up with medical professionals if any issues arise.    Follow Up Instructions: Counselor will send Webex link for session tomorrow.  Morgan Fields was advised to call back or seek an in-person evaluation if the symptoms worsen or if the condition fails to improve as anticipated.   Collaboration of Care:   Medication Management AEB Dr. Eliseo Gum or Hillery Jacks, NP                                          Case Manager AEB Jeri Modena, CNA   Patient/Guardian was advised Release of Information must be obtained prior to any record release in order to collaborate their care with an outside provider. Patient/Guardian was advised if they have not already done so to contact the registration department to sign all necessary forms in order for [place] to release information regarding their care.   Consent: Patient/Guardian gives verbal consent for treatment and assignment of benefits for services provided during this visit. Patient/Guardian expressed understanding and agreed to proceed.  I provided 180 minutes of non-face-to-face time during this encounter.   Noralee Stain, [place], LCAS 01/05/23

## 2023-01-06 ENCOUNTER — Other Ambulatory Visit (HOSPITAL_COMMUNITY): Payer: Medicare Other | Admitting: Licensed Clinical Social Worker

## 2023-01-06 DIAGNOSIS — F331 Major depressive disorder, recurrent, moderate: Secondary | ICD-10-CM

## 2023-01-06 DIAGNOSIS — F4310 Post-traumatic stress disorder, unspecified: Secondary | ICD-10-CM

## 2023-01-06 NOTE — Progress Notes (Signed)
Virtual Visit via Video Note   I connected with Morgan Fields, who prefers to go by "Morgan Fields" on 01/06/23 at  9:00 AM EDT by a video enabled telemedicine application and verified that I am speaking with the correct person using two identifiers.   At orientation to the IOP program, Case Manager discussed the limitations of evaluation and management by telemedicine and the availability of in person appointments. The patient expressed understanding and agreed to proceed with virtual visits throughout the duration of the program.   Location:  Patient: Patient Home Provider: [facility]   History of Present Illness: MDD and PTSD  Observations/Objective: Check In: Case Manager checked in with all participants to review discharge dates, insurance authorizations, work-related documents and needs from the treatment team regarding medications. Morgan Fields stated needs and engaged in discussion.    Initial Therapeutic Activity: Counselor facilitated a check-in with Morgan Fields to assess for safety, sobriety and medication compliance.  Counselor also inquired about Morgan Fields's current emotional ratings, as well as any significant changes in thoughts, feelings or behavior since previous check in.  Morgan Fields presented for session on time and was alert, oriented x5, with no evidence or self-report of active SI/HI or A/V H.  Morgan Fields reported compliance with medication and denied use of alcohol or illicit substances.  Morgan Fields reported scores of 0/10 for depression, 1/10 for anxiety, and 0/10 for anger/irritability.  Morgan Fields denied any recent outbursts or panic attacks.  Morgan Fields reported that a recent success was taking a shower, doing some gratitude practice, and spending time on her back porch yesterday for self-care since the weather was nice.  Morgan Fields denied any new challenges at this time.  Morgan Fields reported that her goal this weekend is to work on turning her storage room into a crafting area with her husband.       Second  Therapeutic Activity: Counselor introduced topic of creating mental health maintenance plan today.  Counselor provided handout on subject to members, which stressed the importance of maintaining one's mental health in a similar way to using diet and exercise to ensure physical health.  Counselor walked members through process of identifying triggers which could worsen symptoms, including specific people, places, and things one needs to avoid.  Members were also tasked with identifying warning signs such as thoughts, feelings, or behaviors which could indicate mental health is at increased risk.  Counselor also facilitated conversation on self-care activities and coping strategies which members have previously utilized in the past, are currently using in daily routine, or plan to use soon to assist with managing problems or symptoms when/if they appear.  Counselor encouraged members to revisit their maintenance plan often and make changes as needed to ensure day to day stability.  Intervention was effective, as evidenced by [facility] participating in activity and creating a comprehensive plan, including identification of triggers such as getting unexpected phone calls or texts when she is busy, feeling overwhelmed, or having to repeat herself or clarify boundaries, and warning signs including experiencing digestive upset, chest pain, struggling to focus, isolating from others, or having outbursts.  Morgan Fields also reported that she would make an effort to set aside time for self-care activities such as staying hydrated, following a healthy diet, taking showers regularly and use coping skills such as deep breathing, or physical grounding techniques to manage stressors.    Third Therapeutic Activity: Psycho-educational portion of group was provided by Virgina Evener, Interior and spatial designer of community education with [employer].  [name] provided information on history of her  local agency, mission statement, and the variety  of unique services offered which group members might find beneficial to engage in, including both virtual and in-person support groups, as well as peer support program for mentoring.  [name] offered time to answer member's questions regarding services and encouraged them to consider utilizing these services to assist in working towards their individual wellness goals.  Intervention effectiveness could not be measured, as client did not participate.    Assessment and Plan: Counselor recommends that Morgan Fields remain in IOP treatment to better manage mental health symptoms, ensure stability and pursue completion of treatment plan goals. Counselor recommends adherence to crisis/safety plan, taking medications as prescribed, and following up with medical professionals if any issues arise.    Follow Up Instructions: Counselor will send Webex link for session tomorrow.  Morgan Fields was advised to call back or seek an in-person evaluation if the symptoms worsen or if the condition fails to improve as anticipated.   Collaboration of Care:   Medication Management AEB Dr. Eliseo Gum or Hillery Jacks, NP                                          Case Manager AEB Jeri Modena, CNA   Patient/Guardian was advised Release of Information must be obtained prior to any record release in order to collaborate their care with an outside provider. Patient/Guardian was advised if they have not already done so to contact the registration department to sign all necessary forms in order for [place] to release information regarding their care.   Consent: Patient/Guardian gives verbal consent for treatment and assignment of benefits for services provided during this visit. Patient/Guardian expressed understanding and agreed to proceed.  I provided 180 minutes of non-face-to-face time during this encounter.   Noralee Stain, [place], LCAS 01/06/23

## 2023-01-09 ENCOUNTER — Other Ambulatory Visit (HOSPITAL_COMMUNITY): Payer: Medicare Other | Attending: Licensed Clinical Social Worker | Admitting: Licensed Clinical Social Worker

## 2023-01-09 DIAGNOSIS — F4310 Post-traumatic stress disorder, unspecified: Secondary | ICD-10-CM | POA: Insufficient documentation

## 2023-01-09 DIAGNOSIS — F331 Major depressive disorder, recurrent, moderate: Secondary | ICD-10-CM

## 2023-01-09 DIAGNOSIS — F329 Major depressive disorder, single episode, unspecified: Secondary | ICD-10-CM | POA: Diagnosis present

## 2023-01-09 NOTE — Progress Notes (Signed)
Virtual Visit via Video Note   I connected with Morgan Fields, who prefers to go by "Morgan Fields" on 01/09/23 at  9:00 AM EDT by a video enabled telemedicine application and verified that I am speaking with the correct person using two identifiers.   At orientation to the IOP program, Case Manager discussed the limitations of evaluation and management by telemedicine and the availability of in person appointments. The patient expressed understanding and agreed to proceed with virtual visits throughout the duration of the program.   Location:  Patient: Patient Home Provider: [facility]   History of Present Illness: MDD and PTSD  Observations/Objective: Check In: Case Manager checked in with all participants to review discharge dates, insurance authorizations, work-related documents and needs from the treatment team regarding medications. Morgan Fields stated needs and engaged in discussion.    Initial Therapeutic Activity: Counselor facilitated a check-in with Morgan Fields to assess for safety, sobriety and medication compliance.  Counselor also inquired about Morgan Fields's current emotional ratings, as well as any significant changes in thoughts, feelings or behavior since previous check in.  Morgan Fields presented for session on time and was alert, oriented x5, with no evidence or self-report of active SI/HI or A/V H.  Morgan Fields reported compliance with medication and denied use of alcohol or illicit substances.  Morgan Fields reported scores of 0/10 for depression, 1/10 for anxiety, and 0/10 for anger/irritability.  Morgan Fields denied any recent outbursts or panic attacks.  Morgan Fields reported that a recent success was spending time over the weekend doing some painting in the kitchen.  She reported that an additional success was setting new boundaries with some of her supports, which was empowering.  Morgan Fields reported that a recent struggle was getting lab results from her doctor that were concerning, so she will need to miss group  tomorrow at attend an appointment.  Morgan Fields reported that her goal today is to find self-care activities to engage in to distract herself from ruminating on tomorrow's appointment.       Second Therapeutic Activity: Counselor covered topic of core beliefs with group today.  Counselor virtually shared a handout on the subject, which explained how everyone looks at the world differently, and two people can have the same experience, but have different interpretations of what happened.  Members were encouraged to think of these like sunglasses with different "shades" influencing perception towards positive or negative outcomes.  Examples of negative core beliefs were provided, such as "I'm unlovable", "I'm not good enough", and "I'm a bad person".  Members were asked to share which one(s) they could relate to, and then identify evidence which contradicts these beliefs.  Counselor also provided psychoeducation on positive affirmations today.  Counselor explained how these are positive statements which can be spoken out loud or recited mentally to challenge negative thoughts and/or core beliefs to improve mood and outlook each day.  Counselor shared a comprehensive list of affirmations virtually to members with different categories, including ones for health, confidence, success, and happiness.  Counselor invited members to look through this list and identify any which resonated with them, and practice saying them out loud with sincerity.  Intervention was effective, as evidenced by Shaune Pascal successfully participating in discussion on the subject and reporting that she could relate to several negative core beliefs listed on the handout, such as "I'm not worthy", "I am trapped", "I will end up alone", "The world is dangerous", and "I am bad".  Kristene was able to successfully challenge the core belief "The world is  dangerous" by listing evidence that contradicted it, reporting that since she set healthier boundaries with  social media, she has felt safer, and less worried about bad things happening each day.  Nattaly alsLexiported that she liked several of the positive affirmations listed, such as "I learn from my challenges and always find ways to overcome them", "I press on because I believe in my path", "I am not trying to fit in because I was born to stand out", and "I am safe and sound, all is well".    Assessment and Plan: Counselor recommends that Morgan Fields remain in IOP treatment to better manage mental health symptoms, ensure stability and pursue completion of treatment plan goals. Counselor recommends adherence to crisis/safety plan, taking medications as prescribed, and following up with medical professionals if any issues arise.    Follow Up Instructions: Counselor will send Webex link for session tomorrow.  Morgan Fields was advised to call back or seek an in-person evaluation if the symptoms worsen or if the condition fails to improve as anticipated.   Collaboration of Care:   Medication Management AEB Dr. Eliseo Gum or Hillery Jacks, NP                                          Case Manager AEB Jeri Modena, CNA   Patient/Guardian was advised Release of Information must be obtained prior to any record release in order to collaborate their care with an outside provider. Patient/Guardian was advised if they have not already done so to contact the registration department to sign all necessary forms in order for [place] to release information regarding their care.   Consent: Patient/Guardian gives verbal consent for treatment and assignment of benefits for services provided during this visit. Patient/Guardian expressed understanding and agreed to proceed.  I provided 180 minutes of non-face-to-face time during this encounter.   Noralee Stain, [place], LCAS 01/09/23

## 2023-01-10 ENCOUNTER — Other Ambulatory Visit (HOSPITAL_COMMUNITY): Payer: Medicare Other

## 2023-01-11 ENCOUNTER — Other Ambulatory Visit (HOSPITAL_COMMUNITY): Payer: Medicare Other | Attending: Licensed Clinical Social Worker | Admitting: Licensed Clinical Social Worker

## 2023-01-11 DIAGNOSIS — F4310 Post-traumatic stress disorder, unspecified: Secondary | ICD-10-CM | POA: Insufficient documentation

## 2023-01-11 DIAGNOSIS — F331 Major depressive disorder, recurrent, moderate: Secondary | ICD-10-CM | POA: Insufficient documentation

## 2023-01-11 NOTE — Progress Notes (Signed)
Virtual Visit via Video Note   I connected with Yocelyn K. Dehne, who prefers to go by "[name]" on 01/11/23 at  9:00 AM EDT by a video enabled telemedicine application and verified that I am speaking with the correct person using two identifiers.   At orientation to the IOP program, Case Manager discussed the limitations of evaluation and management by telemedicine and the availability of in person appointments. The patient expressed understanding and agreed to proceed with virtual visits throughout the duration of the program.   Location:  Patient: Patient Home Provider: [facility]   History of Present Illness: MDD and PTSD  Observations/Objective: Check In: Case Manager checked in with all participants to review discharge dates, insurance authorizations, work-related documents and needs from the treatment team regarding medications. [name] stated needs and engaged in discussion.    Initial Therapeutic Activity: Counselor facilitated a check-in with [name] to assess for safety, sobriety and medication compliance.  Counselor also inquired about [name]'s current emotional ratings, as well as any significant changes in thoughts, feelings or behavior since previous check in.  [name] presented for session on time and was alert, oriented x5, with no evidence or self-report of active SI/HI or A/V H.  [name] reported compliance with medication and denied use of alcohol or illicit substances.  [name] reported scores of 0/10 for depression, 1/10 for anxiety, and 0/10 for anger/irritability.  [name] denied any recent outbursts or panic attacks.  [name] reported that a recent struggle was having to miss group yesterday in order to attend a doctor's appointment, which led her to have a CAT scan ordered, and start antibiotics.  [name] reported that a recent success was noticing that she has felt more grounded and calm since starting group.  [name] reported that her goal today is to take time to rest.        Second Therapeutic Activity: Counselor introduced Peggye Fothergill, [employer] Pharmacist, to provide psychoeducation on topic of medication compliance with members today.  Michelle Nasuti provided psychoeducation on classes of medications such as antidepressants, antipsychotics, what symptoms they are intended to treat, and any side effects one might encounter while on a particular prescription.  Time was allowed for clients to ask any questions they might have of [facility] regarding this specialty.  Intervention effectiveness could not be measured, as client did not participate.    Third Therapeutic Activity: Counselor introduced Celine Mans, [employer], to provide psychoeducation on topic of nutrition with members today.  Jae Dire virtually shared a comprehensive PowerPoint presentation to guide discussion, which featured the various components of healthy living that influence one's wellbeing, including practicing mindfulness, staying physically active, calm in mood, well-rested, and more.  Jae Dire explained how good nutrition reinforces positive physical and mental health, and shared a video which explained how food intake affects brain functioning in particular.  Jae Dire provided advice on how to adjust diet in order to promote wellbeing during course of treatment, including achieving balanced daily intake along with regular exercise.  Jae Dire offered the 'plate method' as a tool for proper distribution of protein, grains, starches, vegetables, fruit, and low calorie drink, in addition to concept of 'mindful eating', and covering current Dietary Guidelines for Americans for more tips.  Jae Dire inquired about changes members would like to make to their nutrition in order to increase overall wellbeing based upon information shared today, and time was allowed to ask any questions they might have of Jae Dire regarding her specialty.  Intervention effectiveness was mixed, as [name] did not participate in  discussion, but she did participate in a  stretching activity with guidance from speaker to increase physical activity/exercise.    Assessment and Plan: Counselor recommends that [name] remain in IOP treatment to better manage mental health symptoms, ensure stability and pursue completion of treatment plan goals. Counselor recommends adherence to crisis/safety plan, taking medications as prescribed, and following up with medical professionals if any issues arise.    Follow Up Instructions: Counselor will send Webex link for session tomorrow.  [name] was advised to call back or seek an in-person evaluation if the symptoms worsen or if the condition fails to improve as anticipated.   Collaboration of Care:   Medication Management AEB Dr. Eliseo Gum or Hillery Jacks, NP                                          Case Manager AEB Jeri Modena, CNA   Patient/Guardian was advised Release of Information must be obtained prior to any record release in order to collaborate their care with an outside provider. Patient/Guardian was advised if they have not already done so to contact the registration department to sign all necessary forms in order for [place] to release information regarding their care.   Consent: Patient/Guardian gives verbal consent for treatment and assignment of benefits for services provided during this visit. Patient/Guardian expressed understanding and agreed to proceed.  I provided 180 minutes of non-face-to-face time during this encounter.   Noralee Stain, [place], LCAS 01/11/23

## 2023-01-12 ENCOUNTER — Other Ambulatory Visit (HOSPITAL_COMMUNITY): Payer: Medicare Other | Admitting: Licensed Clinical Social Worker

## 2023-01-12 DIAGNOSIS — F331 Major depressive disorder, recurrent, moderate: Secondary | ICD-10-CM

## 2023-01-12 DIAGNOSIS — F4310 Post-traumatic stress disorder, unspecified: Secondary | ICD-10-CM

## 2023-01-12 NOTE — Progress Notes (Signed)
Virtual Visit via Video Note   I connected with Morgan Fields, who prefers to go by "Morgan Fields" on 01/12/23 at  9:00 AM EDT by a video enabled telemedicine application and verified that I am speaking with the correct person using two identifiers.   At orientation to the IOP program, Case Manager discussed the limitations of evaluation and management by telemedicine and the availability of in person appointments. The patient expressed understanding and agreed to proceed with virtual visits throughout the duration of the program.   Location:  Patient: Patient Home Provider: [facility]   History of Present Illness: MDD and PTSD  Observations/Objective: Check In: Case Manager checked in with all participants to review discharge dates, insurance authorizations, work-related documents and needs from the treatment team regarding medications. Morgan Fields stated needs and engaged in discussion.    Initial Therapeutic Activity: Counselor facilitated a check-in with Morgan Fields to assess for safety, sobriety and medication compliance.  Counselor also inquired about Morgan Fields's current emotional ratings, as well as any significant changes in thoughts, feelings or behavior since previous check in.  Morgan Fields presented for session on time and was alert, oriented x5, with no evidence or self-report of active SI/HI or A/V H.  Morgan Fields reported compliance with medication and denied use of alcohol or illicit substances.  Morgan Fields reported scores of 4/10 for depression, 5/10 for anxiety, and 0/10 for anger/irritability.  Morgan Fields denied any recent outbursts or panic attacks.  Morgan Fields reported that an ongoing struggle is not feeling well, stating "I've lost some weight and I'm still feeling light headed and dizzy at times".  She reported that she would keep her husband updated on her condition and visit urgent care or call 911 if necessary.  Morgan Fields reported that a success has been having group to distract her during the morning, which  helps lift her spirits.  Morgan Fields reported that her goal today is to rest and avoid overexerting herself.         Second Therapeutic Activity: Counselor covered topic of attachment styles today.  Counselor virtually shared a handout with the group on this topic which defined attachment styles as how people think about and behave in relationships.  Styles were broken down by category, including secure attachment where one believes close relationships are trustworthy, compared to insecure attachment (i.e. anxious, avoidant, or anxious-avoidant) where one is distrusting or worries about their bond with others.  Counselor inquired about which attachment style members most related to, how this has influenced their mental health/well-being, and whether they intend to begin making any changes.  Intervention was effective, as evidenced by [facility] participating in discussion, and reporting that she most identified with the anxious/avoidant attachment style due to traits such as being afraid of abandonment, rejection and conflict, having a tendency toward emotional extremes, sensitive to criticism and hungry for approval.  Morgan Fields reported that her childhood had a big influence upon this issue, as her family dynamic changed forms and people left her life unexpectedly several times.  Morgan Fields reported that she plans to stay engaged in therapy to improve attachment, and work on her assertive communication so that she can more clearly express thoughts and feelings with loved ones rather than wearing an 'emotional mask' each day.    Assessment and Plan: Counselor recommends that Morgan Fields remain in IOP treatment to better manage mental health symptoms, ensure stability and pursue completion of treatment plan goals. Counselor recommends adherence to crisis/safety plan, taking medications as prescribed, and following up with medical professionals if any  issues arise.    Follow Up Instructions: Counselor will send Webex link for  session tomorrow.  Morgan Fields was advised to call back or seek an in-person evaluation if the symptoms worsen or if the condition fails to improve as anticipated.   Collaboration of Care:   Medication Management AEB Dr. Eliseo Gum or Hillery Jacks, NP                                          Case Manager AEB Jeri Modena, CNA   Patient/Guardian was advised Release of Information must be obtained prior to any record release in order to collaborate their care with an outside provider. Patient/Guardian was advised if they have not already done so to contact the registration department to sign all necessary forms in order for [place] to release information regarding their care.   Consent: Patient/Guardian gives verbal consent for treatment and assignment of benefits for services provided during this visit. Patient/Guardian expressed understanding and agreed to proceed.  I provided 180 minutes of non-face-to-face time during this encounter.   Noralee Stain, [place], LCAS 01/12/23

## 2023-01-13 ENCOUNTER — Other Ambulatory Visit (HOSPITAL_COMMUNITY): Payer: Medicare Other | Admitting: Licensed Clinical Social Worker

## 2023-01-13 DIAGNOSIS — F4310 Post-traumatic stress disorder, unspecified: Secondary | ICD-10-CM

## 2023-01-13 DIAGNOSIS — F331 Major depressive disorder, recurrent, moderate: Secondary | ICD-10-CM

## 2023-01-13 NOTE — Progress Notes (Signed)
Virtual Visit via Video Note   I connected with Morgan Fields, who prefers to go by "Morgan Fields" on 01/13/23 at  9:00 AM EDT by a video enabled telemedicine application and verified that I am speaking with the correct person using two identifiers.   At orientation to the IOP program, Case Manager discussed the limitations of evaluation and management by telemedicine and the availability of in person appointments. The patient expressed understanding and agreed to proceed with virtual visits throughout the duration of the program.   Location:  Patient: Patient Home Provider: [facility]   History of Present Illness: MDD and PTSD  Observations/Objective: Check In: Case Manager checked in with all participants to review discharge dates, insurance authorizations, work-related documents and needs from the treatment team regarding medications. Morgan Fields stated needs and engaged in discussion.    Initial Therapeutic Activity: Counselor facilitated a check-in with Morgan Fields to assess for safety, sobriety and medication compliance.  Counselor also inquired about Morgan Fields's current emotional ratings, as well as any significant changes in thoughts, feelings or behavior since previous check in.  Morgan Fields presented for session on time and was alert, oriented x5, with no evidence or self-report of active SI/HI or A/V H.  Morgan Fields reported compliance with medication and denied use of alcohol or illicit substances.  Morgan Fields reported scores of 0/10 for depression, 1/10 for anxiety, and 0/10 for anger/irritability.  Morgan Fields denied any recent outbursts or panic attacks.  Morgan Fields reported that a recent success was taking time to go outside on her porch and relax yesterday after group, stating "I think I got a little emotional and just needed to get outside in nature for a bit".  Morgan Fields reported that an ongoing struggle is dealing with physical health issues, although she hasn't noticed these symptoms getting worse.    Second  Therapeutic Activity: Counselor introduced topic of building a social support network today.  Counselor explained how this can be defined as having a having a group of healthy people in one's life you can talk to, spend time with, and get help from to improve both mental and physical health.  Counselor noted that some barriers can make it difficult to connect with other people, including the presence of anxiety or depression, or moving to an unfamiliar area.  Group members were asked to assess the current state of their support network, and identify ways that this could be improved.  Tips were given on how to address previously noted barriers, such as strengthening social skills, using relaxation techniques to reduce anxiety, scheduling social time each week, and/or exploring social events nearby which could increase chances of meeting new supports.  Members were also encouraged to consider getting closer to people they already know through suggestions such as outreaching someone by text, email or phone call if they haven't spoken in awhile, doing something nice for a friend/family member unexpectedly, and/or inviting someone over for a game/movie/dinner night.  Intervention was effective, as evidenced by Morgan Fields actively participating in discussion on the subject, and reporting that she had difficulty establishing a strong friend group in adulthood due to her husband's military service leading them to move around frequently.  She stated "You just can't make lasting friendships like that".  She reported that her childhood also led her to conceal true feelings from people due to fear of being a burden, which has led to lack of true connection and understanding in many cases.  Morgan Fields reported that her goal will be to open up more honestly  with those closest to her to set healthier boundaries and increase overall support, stating "My daughter in law called last night and asked if there was anything I needed.  I told her  just checking in on my meant the world, and she said she would get better at that.  Me and my husband had a wonderful conversation about that afterward".    Assessment and Plan: Counselor recommends that Morgan Fields remain in IOP treatment to better manage mental health symptoms, ensure stability and pursue completion of treatment plan goals. Counselor recommends adherence to crisis/safety plan, taking medications as prescribed, and following up with medical professionals if any issues arise.    Follow Up Instructions: Counselor will send Webex link for session tomorrow.  Morgan Fields was advised to call back or seek an in-person evaluation if the symptoms worsen or if the condition fails to improve as anticipated.   Collaboration of Care:   Medication Management AEB Dr. Eliseo Gum or Hillery Jacks, NP                                          Case Manager AEB Jeri Modena, CNA   Patient/Guardian was advised Release of Information must be obtained prior to any record release in order to collaborate their care with an outside provider. Patient/Guardian was advised if they have not already done so to contact the registration department to sign all necessary forms in order for [place] to release information regarding their care.   Consent: Patient/Guardian gives verbal consent for treatment and assignment of benefits for services provided during this visit. Patient/Guardian expressed understanding and agreed to proceed.  I provided 180 minutes of non-face-to-face time during this encounter.   Noralee Stain, [place], LCAS 01/13/23

## 2023-01-16 ENCOUNTER — Other Ambulatory Visit (HOSPITAL_COMMUNITY): Payer: Medicare Other | Admitting: Licensed Clinical Social Worker

## 2023-01-16 DIAGNOSIS — F331 Major depressive disorder, recurrent, moderate: Secondary | ICD-10-CM

## 2023-01-16 DIAGNOSIS — F4310 Post-traumatic stress disorder, unspecified: Secondary | ICD-10-CM

## 2023-01-16 NOTE — Progress Notes (Signed)
Virtual Visit via Video Note   I connected with Morgan Fields, who prefers to go by "Morgan Fields" on 01/16/23 at  9:00 AM EDT by a video enabled telemedicine application and verified that I am speaking with the correct person using two identifiers.   At orientation to the IOP program, Case Manager discussed the limitations of evaluation and management by telemedicine and the availability of in person appointments. The patient expressed understanding and agreed to proceed with virtual visits throughout the duration of the program.   Location:  Patient: Patient Home Provider: [facility]   History of Present Illness: MDD and PTSD  Observations/Objective: Check In: Case Manager checked in with all participants to review discharge dates, insurance authorizations, work-related documents and needs from the treatment team regarding medications. Morgan Fields stated needs and engaged in discussion.    Initial Therapeutic Activity: Counselor facilitated a check-in with Morgan Fields to assess for safety, sobriety and medication compliance.  Counselor also inquired about Morgan Fields's current emotional ratings, as well as any significant changes in thoughts, feelings or behavior since previous check in.  Morgan Fields presented for session on time and was alert, oriented x5, with no evidence or self-report of active SI/HI or A/V H.  Morgan Fields reported compliance with medication and denied use of alcohol or illicit substances.  Morgan Fields reported scores of 0/10 for depression, 0/10 for anxiety, and 0/10 for anger/irritability.  Morgan Fields denied any recent outbursts or panic attacks.  Morgan Fields reported that an ongoing struggle is experiencing physical health issues, although she was able to do some painting around the home, and yardwork when she had energy over the weekend.  She reported that she was also able to get a shower in one day.  Morgan Fields reported that her goal today is to spend time with her husband when he gets home, and sit outside on  the porch to enjoy the warm weather.         Second Therapeutic Activity: Counselor engaged the group in discussion on managing work/life balance today to improve mental health and wellness.  Counselor explained how finding balance between responsibilities at home and work place can be challenging, and lead to increased stress.  Counselor facilitated discussion on what challenges members are currently, or have historically faced.  Counselor also discussed strategies for improving work/life balance while members work on their mental health during treatment.  Some of these included keeping track of time management; creating a list of priorities and scaling importance; setting realistic, measurable goals each day; establishing boundaries; taking care of health needs; and nurturing relationships at home and work for support.  Counselor inquired about areas where members feel they are excelling, as well as areas they could focus on during treatment. Intervention was effective, as evidenced by Morgan Fields actively participating in discussion on topic and reporting that although she is currently retired, this made her reflect upon challenges faced when she was working and trying to balance day to day responsibilities.  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  Morgan Fields reported that she experienced several symptoms of burnout at some point in her life, including frequent headaches, change in sleep habits, feelings of self-doubt, feeling overwhelmed, withdrawing from responsibilities, and procrastination.  Morgan Fields was receptive to suggestions offered today for addressing work life imbalance, including working  on refusal skills to get better at saying "No" to establish boundaries against extra/unreasonable work tasks  at home, improving diet to alleviate recent gastro issues, and trying to get exercise when possible to improve health and ensure an outlet for stress.    Assessment and Plan: Counselor recommends that Morgan Fields remain in IOP treatment to better manage mental health symptoms, ensure stability and pursue completion of treatment plan goals. Counselor recommends adherence to crisis/safety plan, taking medications as prescribed, and following up with medical professionals if any issues arise.    Follow Up Instructions: Counselor will send Webex link for session tomorrow.  Morgan Fields was advised to call back or seek an in-person evaluation if the symptoms worsen or if the condition fails to improve as anticipated.   Collaboration of Care:   Medication Management AEB Dr. Eliseo Gum or Hillery Jacks, NP                                          Case Manager AEB Jeri Modena, CNA   Patient/Guardian was advised Release of Information must be obtained prior to any record release in order to collaborate their care with an outside provider. Patient/Guardian was advised if they have not already done so to contact the registration department to sign all necessary forms in order for [place] to release information regarding their care.   Consent: Patient/Guardian gives verbal consent for treatment and assignment of benefits for services provided during this visit. Patient/Guardian expressed understanding and agreed to proceed.  I provided 180 minutes of non-face-to-face time during this encounter.   Noralee Stain, [place], LCAS 01/16/23

## 2023-01-17 ENCOUNTER — Telehealth (HOSPITAL_COMMUNITY): Payer: Self-pay | Admitting: Emergency Medicine

## 2023-01-17 ENCOUNTER — Other Ambulatory Visit (HOSPITAL_COMMUNITY): Payer: Medicare Other | Admitting: Licensed Clinical Social Worker

## 2023-01-17 ENCOUNTER — Telehealth: Payer: Self-pay | Admitting: Emergency Medicine

## 2023-01-17 DIAGNOSIS — F331 Major depressive disorder, recurrent, moderate: Secondary | ICD-10-CM

## 2023-01-17 DIAGNOSIS — F4310 Post-traumatic stress disorder, unspecified: Secondary | ICD-10-CM | POA: Diagnosis not present

## 2023-01-17 NOTE — Progress Notes (Signed)
Virtual Visit via Video Note   I connected with Morgan Fields, who prefers to go by "Morgan Fields" on 01/17/23 at  9:00 AM EDT by a video enabled telemedicine application and verified that I am speaking with the correct person using two identifiers.   At orientation to the IOP program, Case Manager discussed the limitations of evaluation and management by telemedicine and the availability of in person appointments. The patient expressed understanding and agreed to proceed with virtual visits throughout the duration of the program.   Location:  Patient: Patient Home Provider: Clinical Home Office   History of Present Illness: MDD and PTSD  Observations/Objective: Check In: Case Manager checked in with all participants to review discharge dates, insurance authorizations, work-related documents and needs from the treatment team regarding medications. Morgan Fields stated needs and engaged in discussion.    Initial Therapeutic Activity: Counselor facilitated a check-in with Morgan Fields to assess for safety, sobriety and medication compliance.  Counselor also inquired about Morgan Fields's current emotional ratings, as well as any significant changes in thoughts, feelings or behavior since previous check in.  Morgan Fields presented for session on time and was alert, oriented x5, with no evidence or self-report of active SI/HI or A/V H.  Morgan Fields reported compliance with medication and denied use of alcohol or illicit substances.  Morgan Fields reported scores of 0/10 for depression, 0/10 for anxiety, and 0/10 for anger/irritability.  Morgan Fields denied any recent outbursts or panic attacks.  Morgan Fields reported that a recent success was soaking her feet yesterday, and then doing a pedicure for self-care.  Morgan Fields reported that she continues to experience nausea and dizziness each day, but she is keeping her support network updated, including her PCP.  Morgan Fields reported that her goal today is to sit on the back porch and have a nice lunch.        Second Therapeutic Activity: Counselor continued discussion upon topic of self-care today with group members.  Counselor virtually shared self-care assessment form with members via Webex to complete final sub-categories (i.e. social, spiritual, and professional).  Members were asked to rank their engagement in the activities listed for each dimension on a scale of 1-3, with 1 indicating 'Poor', 2 indicating 'Ok', and 3 indicating 'Well'.  Counselor invited members to share results of this assessment, and inquired about which areas of self-care they are doing well in, as well as areas that require attention, and how they plan to begin addressing this during treatment.  Intervention was effective, as evidenced by [facility] successfully completing final sections of assessment and actively engaging in discussion on subject, reporting that she is excelling in areas such as spending time with people she likes, having stimulating conversations, spending time alone with her husband, asking others for help when needed, doing enjoyable activities with friends, but would benefit from focusing more on areas such as meeting new people, and keeping in touch with old friends.  Morgan Fields reported that she would work to improve self-care deficits by using support groups to meet new people she can connect with on a deeper level, and making an effort to get out of the home and socialize in city events to make new connections when her physical health improves.      Assessment and Plan: Counselor recommends that Morgan Fields remain in IOP treatment to better manage mental health symptoms, ensure stability and pursue completion of treatment plan goals. Counselor recommends adherence to crisis/safety plan, taking medications as prescribed, and following up with medical professionals if any issues arise.  Follow Up Instructions: Counselor will send Webex link for session tomorrow.  Morgan Fields was advised to call back or seek an in-person  evaluation if the symptoms worsen or if the condition fails to improve as anticipated.   Collaboration of Care:   Medication Management AEB Dr. Eliseo Gum or Hillery Jacks, NP                                          Case Manager AEB Jeri Modena, CNA   Patient/Guardian was advised Release of Information must be obtained prior to any record release in order to collaborate their care with an outside provider. Patient/Guardian was advised if they have not already done so to contact the registration department to sign all necessary forms in order for [place] to release information regarding their care.   Consent: Patient/Guardian gives verbal consent for treatment and assignment of benefits for services provided during this visit. Patient/Guardian expressed understanding and agreed to proceed.  I provided 180 minutes of non-face-to-face time during this encounter.   Noralee Stain, [place], LCAS 01/17/23

## 2023-01-17 NOTE — Telephone Encounter (Signed)
Pt called and inquired about missing Lipase result. Spoke with provider, reviewed callback RN notes. Provider reported for pt to continue with current plan but that if pt wanted lipase level redrawn that would be possible.   Discussed options with pt and reported has followed up with pcp and has pending CT scan. Pt did not elect to have blood redrawn at this time.

## 2023-01-17 NOTE — Telephone Encounter (Signed)
Patient left voicemail looking for results of recent lipase.  Reviewed, no result listed yet, called [facility] to follow up, we will not be receiving a result.   Reviewed with Dr. Leonides Grills, states no change to plan at this time.   Attempted to reach patient to review, husband answered and states he will relay the message

## 2023-01-18 ENCOUNTER — Other Ambulatory Visit (HOSPITAL_COMMUNITY): Payer: Medicare Other | Attending: Licensed Clinical Social Worker | Admitting: Licensed Clinical Social Worker

## 2023-01-18 DIAGNOSIS — F4310 Post-traumatic stress disorder, unspecified: Secondary | ICD-10-CM

## 2023-01-18 DIAGNOSIS — F331 Major depressive disorder, recurrent, moderate: Secondary | ICD-10-CM | POA: Diagnosis not present

## 2023-01-18 DIAGNOSIS — F411 Generalized anxiety disorder: Secondary | ICD-10-CM | POA: Insufficient documentation

## 2023-01-18 NOTE — Progress Notes (Signed)
Virtual Visit via Video Note   I connected with Morgan Fields, who prefers to go by "Morgan Fields" on 01/18/23 at  9:00 AM EDT by a video enabled telemedicine application and verified that I am speaking with the correct person using two identifiers.   At orientation to the IOP program, Case Manager discussed the limitations of evaluation and management by telemedicine and the availability of in person appointments. The patient expressed understanding and agreed to proceed with virtual visits throughout the duration of the program.   Location:  Patient: Patient Home Provider: [facility]   History of Present Illness: MDD and PTSD  Observations/Objective: Check In: Case Manager checked in with all participants to review discharge dates, insurance authorizations, work-related documents and needs from the treatment team regarding medications. Morgan Fields stated needs and engaged in discussion.    Initial Therapeutic Activity: Counselor facilitated a check-in with Morgan Fields to assess for safety, sobriety and medication compliance.  Counselor also inquired about Morgan Fields's current emotional ratings, as well as any significant changes in thoughts, feelings or behavior since previous check in.  Morgan Fields presented for session on time and was alert, oriented x5, with no evidence or self-report of active SI/HI or A/V H.  Morgan Fields reported compliance with medication and denied use of alcohol or illicit substances.  Morgan Fields reported scores of 0/10 for depression, 1/10 for anxiety, and 0/10 for anger/irritability.  Morgan Fields denied any recent outbursts or panic attacks.  Morgan Fields reported that a recent success was doing more painting in the home yesterday to preoccupy her time.  Morgan Fields reported that an ongoing struggle has been dealing with lightheadedness/dizziness/nausea, which is worse today.  She reported that she is keeping her PCP updated and will contact 911 and/or go to urgent care if needed should condition worsen.   Morgan Fields  reported that her goal today is to take things easier and avoid overwhelming herself so symptoms will hopefully improve.       Second Therapeutic Activity: Counselor provided psychoeducation on subject of boundaries with group members today using a virtual handout.  This handout defined boundaries as the limits and rules that we set for ourselves within relationships, and featured a breakdown of the 3 common categories of boundaries (i.e. porous, rigid, and healthy), along with typical traits specific to each one for easy identification.  It was noted that most people have a mixture of different boundary types depending on setting, person, and culture.  Additional information was provided on the types of boundaries (i.e. physical, intellectual, emotional, sexual, material, and time) within relationships, and what could be considered healthy versus unhealthy. Counselor tasked members with identifying what types of boundaries they presently hold within her own support systems, the collective impact these boundaries have upon their mental health, and changes that could be made in order to more effectively communicate individual mental health needs.  Intervention was effective, as evidenced by Morgan Fields actively engaging in discussion on topic, reporting that she has rigid boundaries due to traits such as avoiding intimacy, avoiding asking for help, seeming detached from others, and being protective of personal information.  Morgan Fields reported that since starting group therapy, she has been able to improve her boundaries and learn to open up with others, share deep thoughts and feelings without fear of judgement.  She reported that she hopes to continue with some form of support group after completion of [facility]OP in order to reinforce this positive new approach.    Assessment and Plan: Counselor recommends that Morgan Fields remain in IOP  treatment to better manage mental health symptoms, ensure stability and pursue completion of  treatment plan goals. Counselor recommends adherence to crisis/safety plan, taking medications as prescribed, and following up with medical professionals if any issues arise.    Follow Up Instructions: Counselor will send Webex link for session tomorrow.  Morgan Fields was advised to call back or seek an in-person evaluation if the symptoms worsen or if the condition fails to improve as anticipated.   Collaboration of Care:   Medication Management AEB Dr. Eliseo Gum or Hillery Jacks, NP                                          Case Manager AEB Jeri Modena, CNA   Patient/Guardian was advised Release of Information must be obtained prior to any record release in order to collaborate their care with an outside provider. Patient/Guardian was advised if they have not already done so to contact the registration department to sign all necessary forms in order for [place] to release information regarding their care.   Consent: Patient/Guardian gives verbal consent for treatment and assignment of benefits for services provided during this visit. Patient/Guardian expressed understanding and agreed to proceed.  I provided 180 minutes of non-face-to-face time during this encounter.   Noralee Stain, LCSW, LCAS 01/18/23

## 2023-01-19 ENCOUNTER — Other Ambulatory Visit (HOSPITAL_COMMUNITY): Payer: Medicare Other | Admitting: Psychiatry

## 2023-01-19 DIAGNOSIS — F411 Generalized anxiety disorder: Secondary | ICD-10-CM

## 2023-01-19 DIAGNOSIS — F4310 Post-traumatic stress disorder, unspecified: Secondary | ICD-10-CM | POA: Diagnosis not present

## 2023-01-19 DIAGNOSIS — F331 Major depressive disorder, recurrent, moderate: Secondary | ICD-10-CM

## 2023-01-19 MED ORDER — PRAZOSIN HCL 1 MG PO CAPS: 1.0000 mg | ORAL_CAPSULE | Freq: Every day | ORAL | 0 refills | Status: DC

## 2023-01-19 MED ORDER — FLUOXETINE HCL 10 MG PO CAPS: 10.0000 mg | ORAL_CAPSULE | Freq: Every day | ORAL | 0 refills | Status: DC

## 2023-01-19 MED ORDER — QUETIAPINE FUMARATE 25 MG PO TABS
25.0000 mg | ORAL_TABLET | Freq: Every day | ORAL | 0 refills | Status: DC
Start: 2023-01-19 — End: 2023-01-26

## 2023-01-19 NOTE — Progress Notes (Signed)
Virtual Visit via Video Note  I connected with Morgan Fields on 01/19/23 at  9:00 AM EDT by a video enabled telemedicine application and verified that I am speaking with the correct person using two identifiers.  Location: Patient: Home Provider: Office   I discussed the limitations of evaluation and management by telemedicine and the availability of in person appointments. The patient expressed understanding and agreed to proceed.    I discussed the assessment and treatment plan with the patient. The patient was provided an opportunity to ask questions and all were answered. The patient agreed with the plan and demonstrated an understanding of the instructions.   The patient was advised to call back or seek an in-person evaluation if the symptoms worsen or if the condition fails to improve as anticipated.   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, no rehab THC- no No other illicit substances Cigarettes- 10 or less, when not anxious,  but when anxious can be more, has never been able to successfully quit trying to keep it at 5 cigs  Family of Origin Issues:  - 1 brother is bipolar -Father had schizophrenia - 1 brother schizophrenia - 1 brother depression (xanax currently)  Progress in Program Toward Treatment Goals: Met  Progress (rationale): Morgan Fields is a 72 year old Caucasian female, married, retired, lives in [place], has a history of PTSD, GAD, MDD, tobacco use disorder, arthritis, osteoporosis was evaluated in office today.   ___  Patient reports that she is doing really well. She is really happy about this. She feels like the medication adjustment to  Prozac has really helped. She reports feeling less "wired" and almost no low mood. She reports she no longer has to take additional propanolol. She reports that she has also really benefited from group and learning about coping skills and using them. Patient reports that she has also realized that some of her previous thoughts about mental health were harmful to her and she has unlearned things. She is also learning to put herself first.   She reports that she sleeps better with her night time meds. She reports that she is no longer having memorable dreams or distressing sleep. She reports that she is not having to take as many daytime, long naps. Patient reports that she is "excited."   Unfortunately, patient still has GI issues, she is still going 5-6x in the AM. She still has nausea as well. She has had an increase in appetite with treatment, and is eating more, but unfortunately she is still going to the bathroom. Shge does think her weight has been able to stabilize the last few weeks. Patient now has abnormal labs, that have led to PCP wanted more workup for her GI issues.  Patient denies SI, HI, and AVH.   Psychiatric Specialty Exam:   Review of Systems  Gastrointestinal:  Positive for diarrhea.  Psychiatric/Behavioral:  Negative for decreased concentration, hallucinations, sleep disturbance and suicidal ideas. The patient is not nervous/anxious.     There were no vitals taken for this visit.There is no height or weight on file to calculate BMI.  General Appearance: Well Groomed  Eye Contact:  Good  Speech:  Clear and Coherent  Volume:  Normal  Mood:  Euthymic  Affect:  Appropriate  Thought Process:  Coherent  Orientation:  Full (Time, Place, and Person)  Thought Content:  Logical  Suicidal Thoughts:  No  Homicidal Thoughts:  No  Memory:  Immediate;   Good Recent;   Good  Judgement:  Good  Insight:  Good  Psychomotor Activity:  Normal  Concentration:  Concentration: Good   Recall:  Good  Fund of Knowledge:  Good  Language:  Good  Akathisia:  NA  Handed:    AIMS (if indicated):     Assets:  Communication Skills Desire for Improvement Housing Resilience Social Support Transportation  ADL's:  Intact  Cognition:  WNL  Sleep:   Good     Collaboration of Care: Other F/U w/ Dr. Elna Breslow 01/26/2023 PTSD- improved MDD secondary to medical illness- resolved GAD- improved Somatic symptoms disorder - Continue Prozac 10mg  daily - D'c Propanolol 10mg  BID PRN - Continue Prazosin 1mg  QHS - Continue Seroquel 25mg  QHS  Patient/Guardian was advised Release of Information must be obtained prior to any record release in order to collaborate their care with an outside provider. Patient/Guardian was advised if they have not already done so to contact the registration department to sign all necessary forms in order for [place] to release information regarding their care.   Consent: Patient/Guardian gives verbal consent for treatment and assignment of benefits for services provided during this visit. Patient/Guardian expressed understanding and agreed to proceed.   PGY-3 Eliseo Gum, MD 01/19/2023

## 2023-01-19 NOTE — Patient Instructions (Signed)
D:  Patient will complete virtual MH-IOP tomorrow (01-20-23).  A:  Discharge 01-20-23.  Follow up with Dr. Elna Breslow on 01-26-23 @ 8:30 a.m and Suzan Garibaldi, LCSW on 02-02-23 @ 11 a.m.Marland Kitchen  Strongly recommend support groups through [facility] [phone].  R:  Patient receptive.

## 2023-01-19 NOTE — Progress Notes (Signed)
Virtual Visit via Video Note   I connected with Morgan Fields, who prefers to go by "Morgan Fields" on 01/19/23 at  9:00 AM EDT by a video enabled telemedicine application and verified that I am speaking with the correct person using two identifiers.   At orientation to the IOP program, Case Manager discussed the limitations of evaluation and management by telemedicine and the availability of in person appointments. The patient expressed understanding and agreed to proceed with virtual visits throughout the duration of the program.   Location:  Patient: Patient Home Provider: [facility]   History of Present Illness: MDD and PTSD  Observations/Objective: Check In: Case Manager checked in with all participants to review discharge dates, insurance authorizations, work-related documents and needs from the treatment team regarding medications. Morgan Fields stated needs and engaged in discussion.    Initial Therapeutic Activity: Counselor facilitated a check-in with Morgan Fields to assess for safety, sobriety and medication compliance.  Counselor also inquired about Morgan Fields's current emotional ratings, as well as any significant changes in thoughts, feelings or behavior since previous check in.  Morgan Fields presented for session on time and was alert, oriented x5, with no evidence or self-report of active SI/HI or A/V H.  Morgan Fields reported compliance with medication and denied use of alcohol or illicit substances.  Morgan Fields reported scores of 0/10 for depression, 0/10 for anxiety, and 0/10 for anger/irritability.  Morgan Fields denied any recent outbursts or panic attacks.  Morgan Fields reported that a recent success was noticing her nausea subside over the past day, which allowed her to do some more painting and gardening in the afternoon.  She reported that her husband got home late, but she was able to enjoy time to herself and not get upset, stating "I'm proud of myself for that.  It gave me time to reach out to old friends too".   Morgan Fields denied any new struggles.  Morgan Fields reported that her goal today is to find something to do for self-care, such as spending more time outside in nature.       Second Therapeutic Activity: Counselor introduced topic of grounding skills today.  Counselor defined these as simple strategies one can use to help detach from difficult thoughts or feelings temporarily by focusing on something else.  Counselor noted that grounding will not solve the problem at hand, but can provide the practitioner with time to regain control over their thoughts and/or feelings and prevent the situation from getting worse (i.e. interrupting a panic attack).  Counselor divided these into three categories (mental, physical, and soothing) and then provided examples of each which group members could practice during session.  Some of these included describing one's environment in detail or playing a categories game with oneself for mental category, taking a hot bath/shower, stretching, or carrying a grounding object for physical category, and saying kind statements, or visualizing people one cares about for soothing category.  Counselor inquired about which techniques members have used with success in the past, or will commit to learning, practicing, and applying now to improve coping abilities.  Intervention was effective, as evidenced by [facility] participating in discussion on the subject, trying out several of the techniques during session, and expressing interest in adding several to her available coping skills, such as describing her environment in great detail, reading silly jokes that can make her laugh, playing with her pets since they cheer up her mood, counting to 10, reciting the alphabet, practicing deep breathing, reciting kind statements to herself to combat negative thoughts,  and looking back at old photos of her and her husband that make her smile.    Assessment and Plan: Counselor recommends that Morgan Fields remain in IOP  treatment to better manage mental health symptoms, ensure stability and pursue completion of treatment plan goals. Counselor recommends adherence to crisis/safety plan, taking medications as prescribed, and following up with medical professionals if any issues arise.    Follow Up Instructions: Counselor will send Webex link for session tomorrow.  Morgan Fields was advised to call back or seek an in-person evaluation if the symptoms worsen or if the condition fails to improve as anticipated.   Collaboration of Care:   Medication Management AEB Dr. Eliseo Gum or Hillery Jacks, NP                                          Case Manager AEB Jeri Modena, CNA   Patient/Guardian was advised Release of Information must be obtained prior to any record release in order to collaborate their care with an outside provider. Patient/Guardian was advised if they have not already done so to contact the registration department to sign all necessary forms in order for [place] to release information regarding their care.   Consent: Patient/Guardian gives verbal consent for treatment and assignment of benefits for services provided during this visit. Patient/Guardian expressed understanding and agreed to proceed.  I provided 180 minutes of non-face-to-face time during this encounter.   Noralee Stain, LCSW, LCAS 01/19/23

## 2023-01-20 ENCOUNTER — Other Ambulatory Visit (HOSPITAL_COMMUNITY): Payer: Medicare Other | Admitting: Licensed Clinical Social Worker

## 2023-01-20 DIAGNOSIS — F331 Major depressive disorder, recurrent, moderate: Secondary | ICD-10-CM

## 2023-01-20 DIAGNOSIS — F4310 Post-traumatic stress disorder, unspecified: Secondary | ICD-10-CM

## 2023-01-20 NOTE — Progress Notes (Signed)
Virtual Visit via Video Note   I connected with Morgan Fields, who prefers to go by "Morgan Fields" on 01/20/23 at  9:00 AM EDT by a video enabled telemedicine application and verified that I am speaking with the correct person using two identifiers.   At orientation to the IOP program, Case Manager discussed the limitations of evaluation and management by telemedicine and the availability of in person appointments. The patient expressed understanding and agreed to proceed with virtual visits throughout the duration of the program.   Location:  Patient: Patient Home Provider: OPT BH Office   History of Present Illness: MDD and PTSD  Observations/Objective: Check In: Case Manager checked in with all participants to review discharge dates, insurance authorizations, work-related documents and needs from the treatment team regarding medications. Morgan Fields stated needs and engaged in discussion.    Initial Therapeutic Activity: Counselor facilitated a check-in with Morgan Fields to assess for safety, sobriety and medication compliance.  Counselor also inquired about Morgan Fields's current emotional ratings, as well as any significant changes in thoughts, feelings or behavior since previous check in.  Morgan Fields presented for session on time and was alert, oriented x5, with no evidence or self-report of active SI/HI or A/V H.  Morgan Fields reported compliance with medication and denied use of alcohol or illicit substances.  Morgan Fields reported scores of 0/10 for depression, 0/10 for anxiety, and 0/10 for anger/irritability.  Morgan Fields denied any recent outbursts or panic attacks.  Morgan Fields reported that a recent success was having lunch outside on the porch, catching up on laundry, and doing some landscaping yesterday.  Morgan Fields denied any new struggles at this time.  Morgan Fields reported that her goal this weekend is to find more indoors self-care activities to enjoy, since it is expected to rain.       Second Therapeutic Activity: Counselor  discussed topic of distress tolerance today with group.  Counselor shared virtual handout with members that offered a DBT approach represented by the acronym ACCEPTS, and outlined strategies for distracting oneself from distressing emotions, allowing appropriate time for these emotions to lesson in intensity and eventually fade away.  Strategies offered included engaging in positive activities, contributing to the wellbeing of others, comparing one's present situation to a previously difficult one to highlight resilience, using mental imagery, and physical grounding.  Counselor assisted members in creating their own realistic ACCEPTS plan for tackling a distressing emotion of their choice.  Intervention was effective, as evidenced by [facility] participating in creation of the plan, choosing empty as her emotion of focus, and identifying several helpful approaches for distraction, such as doing some gardening outside, calling a supportive friend, reflecting on difficult times she has shown resilience in the past, imagining herself floating on a lazy river to relax, or reciting positive affirmations.   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  Morgan Fields provided information on history of her local agency, mission statement, and the variety of unique services offered which group members might find beneficial to engage in, including both virtual and in-person support groups, as well as peer support program for mentoring.  Morgan Fields offered time to answer member's questions regarding services and encouraged them to consider utilizing these services to assist in working towards their individual wellness goals.  Intervention effectiveness could not be measured, as Morgan Fields did not participate.    Assessment and Plan: Morgan Fields has completed MHIOP and will be discharged today.  Counselor recommends adherence to  crisis/safety plan,  taking medications as prescribed, and following up with medical professionals if any issues arise.    Follow Up Instructions: Morgan Fields was advised to call back or seek an in-person evaluation if the symptoms worsen or if the condition fails to improve as anticipated.   Collaboration of Care:   Medication Management AEB Dr. Eliseo Fields or Morgan Jacks, NP                                          Case Manager AEB Morgan Modena, CNA   Patient/Guardian was advised Release of Information must be obtained prior to any record release in order to collaborate their care with an outside provider. Patient/Guardian was advised if they have not already done so to contact the registration department to sign all necessary forms in order for [place] to release information regarding their care.   Consent: Patient/Guardian gives verbal consent for treatment and assignment of benefits for services provided during this visit. Patient/Guardian expressed understanding and agreed to proceed.  I provided 180 minutes of non-face-to-face time during this encounter.   Morgan Stain, LCSW, LCAS 01/20/23

## 2023-01-20 NOTE — Progress Notes (Signed)
Virtual Visit via Video Note  I connected with Morgan Fields on @TODAY @ at  9:00 AM EDT by a video enabled telemedicine application and verified that I am speaking with the correct person using two identifiers.  Location: Patient: at home Provider: at home office   I discussed the limitations of evaluation and management by telemedicine and the availability of in person appointments. The patient expressed understanding and agreed to proceed.   I discussed the assessment and treatment plan with the patient. The patient was provided an opportunity to ask questions and all were answered. The patient agreed with the plan and demonstrated an understanding of the instructions.   The patient was advised to call back or seek an in-person evaluation if the symptoms worsen or if the condition fails to improve as anticipated.  I provided 30 minutes of non-face-to-face time during this encounter.   Cala Kruckenberg, RITA, M.Ed,CNA   D: CC: previous CCA Note for hx. Dr. Elna Breslow referred pt due to worsening anxiety and depressive sx's.  Pt has been seeing Dr. Elna Breslow for a yr and therapist Suzan Garibaldi, [place] since 2023), per pt states.  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  Stressor:  1) Medical:  In August 2023 started having stomach issues; has been on various meds.  Dx'd with IBS; after having a lot of tests done.  Has been confined to her home b/c of stomach issues.  Has started having very violent dreams again. Hx of very traumatic childhood. Pt denies SI/HI or A/V hallucinations.  Pt attended seventeen MH-IOP days.  Reports feeling "great" today.  States the past three days have been good ones.  "My appetite has increased, I believe it's d/t the medicine."  Pt states she still struggles off and on with physical sx's.  Pt is awaiting a CT Scan appointment. On a scale of 1-10 (10 being the worst); pt rates her anxiety and depression at a 0.  Denies SI/HI or A/V hallucinations.  A:  D/C pt  today.  F/U with Dr. Elna Breslow on 01-26-23 @ 8:30 a.m and Suzan Garibaldi, LCSW on 02-02-23 @ 11 a.m.Marland Kitchen  Strongly recommend support groups through [facility].   Pt was advised of ROI must be obtained prior to any records release in order to collaborate her care with an outside provider.  Pt was advised if she has not already done so to contact the front desk to sign all necessary forms in order for MH-IOP to release info re: her care.  Consent:  Pt gives verbal consent for tx and assignment of benefits for services provided during this telehealth group process.  Pt expressed understanding and agreed to proceed. Collaboration of care:  Collaborate with Dr. Eliseo Gum AEB and Noralee Stain, LCSW AEB, Dr. Leighton Parody and Suzan Garibaldi, LCSW AEB. R:  Pt receptive.  Jeri Modena, M.Ed,CNA

## 2023-01-24 ENCOUNTER — Other Ambulatory Visit (HOSPITAL_COMMUNITY): Payer: Self-pay | Admitting: Obstetrics & Gynecology

## 2023-01-24 ENCOUNTER — Other Ambulatory Visit (HOSPITAL_COMMUNITY): Payer: Self-pay | Admitting: Adult Health

## 2023-01-24 DIAGNOSIS — Z1231 Encounter for screening mammogram for malignant neoplasm of breast: Secondary | ICD-10-CM

## 2023-01-24 DIAGNOSIS — R109 Unspecified abdominal pain: Secondary | ICD-10-CM

## 2023-01-26 ENCOUNTER — Encounter: Payer: Self-pay | Admitting: Psychiatry

## 2023-01-26 ENCOUNTER — Ambulatory Visit (INDEPENDENT_AMBULATORY_CARE_PROVIDER_SITE_OTHER): Payer: Medicare Other | Admitting: Psychiatry

## 2023-01-26 VITALS — BP 118/78 | HR 84 | Temp 98.5°F | Ht 62.0 in | Wt 112.8 lb

## 2023-01-26 DIAGNOSIS — F4310 Post-traumatic stress disorder, unspecified: Secondary | ICD-10-CM | POA: Diagnosis not present

## 2023-01-26 DIAGNOSIS — F411 Generalized anxiety disorder: Secondary | ICD-10-CM | POA: Diagnosis not present

## 2023-01-26 DIAGNOSIS — F3342 Major depressive disorder, recurrent, in full remission: Secondary | ICD-10-CM

## 2023-01-26 DIAGNOSIS — F331 Major depressive disorder, recurrent, moderate: Secondary | ICD-10-CM

## 2023-01-26 DIAGNOSIS — F17200 Nicotine dependence, unspecified, uncomplicated: Secondary | ICD-10-CM

## 2023-01-26 MED ORDER — FLUOXETINE HCL 10 MG PO CAPS: 10.0000 mg | ORAL_CAPSULE | Freq: Every day | ORAL | 0 refills | Status: DC

## 2023-01-26 MED ORDER — QUETIAPINE FUMARATE 25 MG PO TABS: 25.0000 mg | ORAL_TABLET | Freq: Every day | ORAL | 0 refills | Status: DC

## 2023-01-26 MED ORDER — PRAZOSIN HCL 1 MG PO CAPS: 1.0000 mg | ORAL_CAPSULE | Freq: Every day | ORAL | 0 refills | Status: DC

## 2023-01-26 NOTE — Progress Notes (Signed)
BH MD OP Progress Note  01/26/2023 9:04 AM Morgan Fields  MRN:  [id number]  Chief Complaint:  Chief Complaint  Patient presents with   Follow-up   Anxiety   Depression   Medication Refill   HPI: Morgan Fields is a 72 year old Caucasian female, married, retired, lives in [place], has a history of PTSD, GAD, MDD, tobacco use disorder, arthritis, osteoporosis was evaluated in the office today.  Patient completed MH IOP at [facility], [facility] outpatient clinic-01/20/2023.  Patient reports that IOP program was very helpful.  She has learned a lot of coping skills and is currently applying it in her day-to-day life.  Patient reports she is currently compliant on her medications.  Cymbalta was discontinued and she was started on Prozac.  She is currently on Prozac 10 mg.  Since being on the Prozac she has felt much better with regards to her anxiety and internal restlessness.  She continues to have GI problems.  This morning had diarrhea 5-6 times.  She also has nausea.  She however reports her appetite has improved.  Likely Seroquel may have helped with appetite.  She however continues to feel tired all the time due to her GI problems especially diarrhea.  She continues to follow-up with her providers, GI specialist.  Has a CT scan scheduled for Friday.  Looks forward to that.  Patient denies any suicidality, homicidality or perceptual disturbances.  Patient reports sleep is overall good.  Denies any significant nightmares.  Currently compliant on the prazosin.  Patient reports the relationship with her husband has improved.  She is able to focus on taking care of herself better than before.  Patient denies any other concerns today. Visit Diagnosis:    ICD-10-CM   1. PTSD (post-traumatic stress disorder)  F43.10 QUEtiapine (SEROQUEL) 25 MG tablet    prazosin (MINIPRESS) 1 MG capsule    FLUoxetine (PROZAC) 10 MG capsule    2. GAD (generalized anxiety disorder)  F41.1 QUEtiapine  (SEROQUEL) 25 MG tablet    prazosin (MINIPRESS) 1 MG capsule    FLUoxetine (PROZAC) 10 MG capsule    3. MDD (major depressive disorder), recurrent, in full remission (HCC)  F33.42 QUEtiapine (SEROQUEL) 25 MG tablet    FLUoxetine (PROZAC) 10 MG capsule    4. Tobacco use disorder  F17.200       Past Psychiatric History: I have reviewed past psychiatric history from progress note on 02/15/2022.  Past trials of Lexapro, hydroxyzine, Cymbalta  Past Medical History:  Past Medical History:  Diagnosis Date   Complication of anesthesia    takes a long time to wake up from anesthesia   Depression    Hiatal hernia    HLD (hyperlipidemia) 07/22/2019   IBS (irritable bowel syndrome)    Osteoporosis 07/22/2019   PMB (postmenopausal bleeding) 03/11/2016   Post-menopause on HRT (hormone replacement therapy) 03/11/2016   Skin cancer    Recent to anterior chest; cervical cancer   Thickened endometrium 03/16/2016   Vaginal Pap smear, abnormal    Vitamin D deficiency disease 07/22/2019    Past Surgical History:  Procedure Laterality Date   BIOPSY  11/08/2022   Procedure: BIOPSY;  Surgeon: Dolores Frame, MD;  [facility];  Service: Gastroenterology;;   BIOPSY  12/14/2022   Procedure: BIOPSY;  Surgeon: Dolores Frame, MD;  [facility];  Service: Gastroenterology;;   BREAST ENHANCEMENT SURGERY     CERVICAL BIOPSY     precancerous years ago   CHOLECYSTECTOMY  2011   COLONOSCOPY N/A 05/08/2013   Procedure: COLONOSCOPY;  Surgeon: Malissa Hippo, MD;  [facility]: [facility];  Service: Endoscopy;  Laterality: N/A;  200   COLONOSCOPY WITH PROPOFOL N/A 12/14/2022   Procedure: COLONOSCOPY WITH PROPOFOL;  Surgeon: Dolores Frame, MD;  [facility];  Service: Gastroenterology;  Laterality: N/A;  9:30am;ASA 2   ESOPHAGOGASTRODUODENOSCOPY (EGD) WITH PROPOFOL N/A 11/08/2022   Procedure: ESOPHAGOGASTRODUODENOSCOPY (EGD) WITH PROPOFOL;   Surgeon: Dolores Frame, MD;  [facility];  Service: Gastroenterology;  Laterality: N/A;  10:15 AM   HYSTEROSCOPY WITH D & C N/A 10/05/2016   Procedure: HYSTEROSCOPY; UTERINE CURETTAGE;  Surgeon: Lazaro Arms, MD;  Location: [facility];  Service: Gynecology;  Laterality: N/A;   POLYPECTOMY N/A 10/05/2016   Procedure: REMOVAL OF ENDOMETRIAL POLYP;  Surgeon: Lazaro Arms, MD;  Location: [facility];  Service: Gynecology;  Laterality: N/A;   TUBAL LIGATION      Family Psychiatric History: I have reviewed family psychiatric history from progress note on 02/15/2022.  Family History:  Family History  Problem Relation Age of Onset   Dementia Mother    Hyperlipidemia Mother    Thyroid disease Mother    Hypertension Mother    Schizophrenia Father    Alcohol abuse Father        Schizophrenia   Depression Brother    Mental illness Brother        Schizophrenia (2 brothers); schizophrenia and bipolar (1 brother)   Heart attack Brother    Schizophrenia Brother    Kidney failure Brother    Bipolar disorder Brother    Alcohol abuse Maternal Uncle    Alcohol abuse Paternal Uncle    Alzheimer's disease Maternal Grandfather    Cancer Maternal Grandmother        colon   Diabetes Maternal Grandmother    Heart attack Paternal Grandfather    Heart disease Paternal Grandfather    Diabetes Paternal Grandfather    Mental illness Paternal Grandmother    Polycystic ovary syndrome Daughter    Other Daughter        knee replacement   Thyroid disease Daughter        had radiation on thyroid    Social History: I have reviewed social history from progress note on 02/15/2022. Social History   Socioeconomic History   Marital status: Married    Spouse name: Not on file   Number of children: Not on file   Years of education: Not on file   Highest education level: Not on file  Occupational History   Not on file  Tobacco Use   Smoking status: Every Day    Packs/day: 0.25    Years:  37.00    Additional pack years: 0.00    Total pack years: 9.25    Types: Cigarettes    Passive exposure: Current   Smokeless tobacco: Never   Tobacco comments:    smokes 5 cig daily- cutting back  Vaping Use   Vaping Use: Never used  Substance and Sexual Activity   Alcohol use: Not Currently    Comment: Quit September 2019   Drug use: No   Sexual activity: Yes    Birth control/protection: Post-menopausal  Other Topics Concern   Not on file  Social History Narrative   Married for 49 years.Retired,used to work at [employer].   Social Determinants of Health   Financial Resource Strain: Low Risk  (01/09/2020)   Overall Physicist, medical Strain (  CARDIA)    Difficulty of Paying Living Expenses: Not hard at all  Food Insecurity: No Food Insecurity (01/09/2020)   Hunger Vital Sign    Worried About Running Out of Food in the Last Year: Never true    Ran Out of Food in the Last Year: Never true  Transportation Needs: No Transportation Needs (01/09/2020)   PRAPARE - Administrator, Civil Service (Medical): No    Lack of Transportation (Non-Medical): No  Physical Activity: Sufficiently Active (01/09/2020)   Exercise Vital Sign    Days of Exercise per Week: 5 days    Minutes of Exercise per Session: 30 min  Stress: No Stress Concern Present (01/09/2020)   [employer] of Occupational Health - Occupational Stress Questionnaire    Feeling of Stress : Only a little  Social Connections: Socially Integrated (01/09/2020)   Social Connection and Isolation Panel [NHANES]    Frequency of Communication with Friends and Family: Once a week    Frequency of Social Gatherings with Friends and Family: More than three times a week    Attends Religious Services: More than 4 times per year    Active Member of Clubs or Organizations: Yes    Attends Engineer, structural: More than 4 times per year    Marital Status: Married    Allergies: No Known  Allergies  Metabolic Disorder Labs: No results found for: "HGBA1C", "MPG" No results found for: "PROLACTIN" Lab Results  Component Value Date   CHOL 239 (H) 01/21/2021   TRIG 86 01/21/2021   HDL 65 01/21/2021   CHOLHDL 3.7 01/21/2021   LDLCALC 155 (H) 01/21/2021   LDLCALC 134 (H) 05/28/2020   Lab Results  Component Value Date   TSH 1.070 03/07/2022    Therapeutic Level Labs: No results found for: "LITHIUM" No results found for: "VALPROATE" No results found for: "CBMZ"  Current Medications: Current Outpatient Medications  Medication Sig Dispense Refill   cholestyramine (QUESTRAN) 4 g packet Take 2 packets (8 g total) by mouth 2 (two) times daily. 180 each 2   famotidine (PEPCID) 40 MG tablet Take 1 tablet (40 mg total) by mouth 2 (two) times daily. 60 tablet 3   meloxicam (MOBIC) 7.5 MG tablet Take 15 mg by mouth daily.     simvastatin (ZOCOR) 5 MG tablet Take 5 mg by mouth at bedtime.     FLUoxetine (PROZAC) 10 MG capsule Take 1 capsule (10 mg total) by mouth daily. 90 capsule 0   prazosin (MINIPRESS) 1 MG capsule Take 1 capsule (1 mg total) by mouth at bedtime. 90 capsule 0   QUEtiapine (SEROQUEL) 25 MG tablet Take 1 tablet (25 mg total) by mouth at bedtime. 90 tablet 0   sucralfate (CARAFATE) 1 g tablet Take 1 tablet (1 g total) by mouth 4 (four) times daily -  with meals and at bedtime for 21 days. 84 tablet 0   No current facility-administered medications for this visit.     Musculoskeletal: Strength & Muscle Tone: within normal limits Gait & Station: normal Patient leans: N/A  Psychiatric Specialty Exam: Review of Systems  Constitutional:  Positive for fatigue.  Gastrointestinal:  Positive for diarrhea and nausea.  Psychiatric/Behavioral:  The patient is nervous/anxious.   All other systems reviewed and are negative.   Blood pressure 118/78, pulse 84, temperature 98.5 F (36.9 C), temperature source Temporal, height 5\' 2"  (1.575 m), weight 112 lb 12.8 oz  (51.2 kg).Body mass index is 20.63 kg/m.  General Appearance:  Casual  Eye Contact:  Good  Speech:  Clear and Coherent  Volume:  Normal  Mood:  Anxious  Affect:  Appropriate  Thought Process:  Goal Directed and Descriptions of Associations: Intact  Orientation:  Full (Time, Place, and Person)  Thought Content: Logical   Suicidal Thoughts:  No  Homicidal Thoughts:  No  Memory:  Immediate;   Fair Recent;   Fair Remote;   Fair  Judgement:  Fair  Insight:  Fair  Psychomotor Activity:  Normal  Concentration:  Concentration: Fair and Attention Span: Fair  Recall:  [employer] of Knowledge: Fair  Language: Fair  Akathisia:  No  Handed:  Right  AIMS (if indicated): done  Assets:  Communication Skills Desire for Improvement Housing Intimacy Social Support Talents/Skills Transportation  ADL's:  Intact  Cognition: WNL  Sleep:  Fair   Screenings: Geneticist, molecular Office Visit from 01/26/2023 in [facility] Office Visit from 05/11/2022 in [facility]  AIMS Total Score 0 0      GAD-7    Flowsheet Row Office Visit from 01/26/2023 in [facility] Office Visit from 12/21/2022 in [facility] Office Visit from 08/24/2022 in [facility] Counselor from 06/02/2022 in [facility] [place] Office Visit from 05/11/2022 in [facility]  Total GAD-7 Score 1 14 7 20 5       PHQ2-9    Flowsheet Row Office Visit from 01/26/2023 in [facility] Counselor from 12/26/2022 in [facility] Office Visit from 12/21/2022 in [facility] Nutrition from 10/27/2022 in [facility] [place] Office Visit  from 08/24/2022 in [facility]  PHQ-2 Total Score 0 5 5 0 3  PHQ-9 Total Score 3 18 20  -- 10      Flowsheet Row Office Visit from 01/26/2023 in [facility] ED from 01/03/2023 in [facility] [place] Counselor from 12/26/2022 in BEHAVIORAL HEALTH INTENSIVE PSYCH  C-SSRS RISK CATEGORY No Risk No Risk Error: Question 6 not populated        Assessment and Plan: Morgan Fields is a 72 year old Caucasian female who has a history of PTSD, MDD, GAD was evaluated in office today.  Patient with continued GI problems, diarrhea which does have an impact on her energy level although she is making progress with regards to her mood symptoms, completed MH IOP.  Discussed plan as noted below.  Plan PTSD-improving Prozac 10 mg p.o. daily Seroquel 25 mg p.o. nightly Will consider repeating EKG. Patient completed MH IOP.  GAD-improving Prozac 10 mg p.o. daily Continue CBT with Mr. Suzan Garibaldi  MDD in remission Seroquel 25 mg p.o. nightly Prozac 10 mg p.o. daily Patient completed MH IOP  Tobacco use disorder-improving Provided counseling for 1 minute.   Reviewed and discussed labs-including most recent CMP-AST/ALT elevated-unknown if secondary to simvastatin.  Patient to have a discussion with primary provider ,CBC-WBC elevated at 17.3.  Patient to discuss with primary care provider. Patient will also need labs like lipid panel, hemoglobin A1c, prolactin level.  Patient advised to sign an ROI to obtain labs from primary physician.  If not done will order the above labs since patient is on Seroquel.  Discussed drug to drug interaction between Seroquel  and medications like Mobic, increasing bleeding risk.  Patient to monitor.  Collaboration of Care: Collaboration of Care: Other I have reviewed notes per MH IOP-Mr. Denyse Amass Bates-01/20/2023.  Patient advised to continue follow-up with Mr.  Montez Morita  Patient/Guardian was advised Release of Information must be obtained prior to any record release in order to collaborate their care with an outside provider. Patient/Guardian was advised if they have not already done so to contact the registration department to sign all necessary forms in order for [place] to release information regarding their care.   Consent: Patient/Guardian gives verbal consent for treatment and assignment of benefits for services provided during this visit. Patient/Guardian expressed understanding and agreed to proceed.   Follow-up in clinic in 2 months or sooner if needed.  This note was generated in part or whole with voice recognition software. Voice recognition is usually quite accurate but there are transcription errors that can and very often do occur. I apologize for any typographical errors that were not detected and corrected.    Jomarie Longs, MD 01/27/2023, 11:45 AM

## 2023-01-27 DIAGNOSIS — F3342 Major depressive disorder, recurrent, in full remission: Secondary | ICD-10-CM | POA: Insufficient documentation

## 2023-01-30 ENCOUNTER — Ambulatory Visit (HOSPITAL_COMMUNITY)
Admission: RE | Admit: 2023-01-30 | Discharge: 2023-01-30 | Disposition: A | Discharge status: Home / Self Care | Payer: Medicare Other | Source: Ambulatory Visit | Attending: Obstetrics & Gynecology | Admitting: Obstetrics & Gynecology

## 2023-01-30 DIAGNOSIS — Z1231 Encounter for screening mammogram for malignant neoplasm of breast: Secondary | ICD-10-CM | POA: Insufficient documentation

## 2023-02-02 ENCOUNTER — Ambulatory Visit (INDEPENDENT_AMBULATORY_CARE_PROVIDER_SITE_OTHER): Payer: Medicare Other | Admitting: Clinical

## 2023-02-02 DIAGNOSIS — F17200 Nicotine dependence, unspecified, uncomplicated: Secondary | ICD-10-CM | POA: Diagnosis not present

## 2023-02-02 DIAGNOSIS — F331 Major depressive disorder, recurrent, moderate: Secondary | ICD-10-CM | POA: Diagnosis not present

## 2023-02-02 DIAGNOSIS — F419 Anxiety disorder, unspecified: Secondary | ICD-10-CM | POA: Diagnosis not present

## 2023-02-02 DIAGNOSIS — F4310 Post-traumatic stress disorder, unspecified: Secondary | ICD-10-CM | POA: Diagnosis not present

## 2023-02-02 NOTE — Progress Notes (Signed)
IN PERSON   I connected with Morgan Fields on 02/02/23 at  11:00 AM EDT in person and verified that I am speaking with the correct person using two identifiers.   Location: Patient: Office Provider: Office   I discussed the limitations of evaluation and management by telemedicine and the availability of in person appointments. The patient expressed understanding and agreed to proceed. ( IN PERSON)     THERAPIST PROGRESS NOTE   Session Time: 11:00 AM-11:55 AM   Participation Level: Active   Behavioral Response: CasualAlertDepressed   Type of Therapy: Individual Therapy   Treatment Goals addressed: Coping   Interventions: CBT, DBT, Solution Focused, Strength-based and Supportive   Summary: Morgan Fields is a 72 y.o. female who presents with PTSD as well as  Depression with Anxiety. The OPT therapist worked with the patient for her OPT treatment. The OPT therapist utilized Motivational Interviewing to assist in creating therapeutic repore. The patient in the session was engaged and work in collaboration giving feedback about her stressors over the past few weeks. The patient identified changes that she has been implementing including not putting herself on the back-burner as much and utilizing a time Financial trader (planner) which has helped her to section her day, journal, and balance work/leisure time. The patient spoke about her experience and take away's from the IOP program. The OPT therapist utilized Cognitive Behavioral Therapy through cognitive restructuring as well as worked with the patient on coping strategies to assist in management of mood including ongoing motivation and empowerment for the patient to be as active as possible with her current health limitations and to dedicate part of her time to her own leisure and self care. The patient spoke about being more active in challenging negative self thought and not looking outward for validation. The patient spoke  about her willingness to work on identifying and challenging negative thoughts to reduce escalation of MH symptoms. The patient spoke about medical imaging scheduled for tomorrow with her physical health provider.    Suicidal/Homicidal: Nowithout intent/plan   Therapist Response: The OPT therapist worked with the patient for the patients scheduled session. The patient was engaged in her session and gave feedback in relation to triggers, symptoms, and behavior responses over the past few weeks.The OPT therapist worked with the patient utilizing an in session Cognitive Behavioral Therapy exercise. The patient was responsive in the session and verbalized, " I realize I am the one who has to be in control of my own life and not be focused on changing or agreeing to thinks in order to make everyone else happy". The OPT therapist worked with the patient on balancing her stressors with leisure and utilizing her support network. Managing different sections of her life. The patient is currently focusing on her leisure time more consistently to help her manage her overall general stress. The patient spoke about ongoing collaboration/work with physical health providers to manage her IBS , the patient has cat scan imaging scheduled for tomorrow with her health provider. The OPT therapist will continue MH treatment work with the patient in her next scheduled session.     Plan: Return again in 2 weeks.   Diagnosis:      Axis I:  PTSD/Major depressive disorder, recurrent episode, moderate with anxious distress /Tobacco Use                           Axis II: No diagnosis  Collaboration of Care: Overview of patient involvement in the med management program with Dr. Elna Breslow.   Patient/Guardian was advised Release of Information must be obtained prior to any record release in order to collaborate their care with an outside provider. Patient/Guardian was advised if they have not already done so to contact the  registration department to sign all necessary forms in order for [place] to release information regarding their care.    Consent: Patient/Guardian gives verbal consent for treatment and assignment of benefits for services provided during this visit. Patient/Guardian expressed understanding and agreed to proceed.    I discussed the assessment and treatment plan with the patient. The patient was provided an opportunity to ask questions and all were answered. The patient agreed with the plan and demonstrated an understanding of the instructions.   The patient was advised to call back or seek an in-person evaluation if the symptoms worsen or if the condition fails to improve as anticipated.   I provided 55 minutes of face-to-face time during this encounter.   Winfred Burn, LCSW   02/02/2023

## 2023-02-03 ENCOUNTER — Ambulatory Visit
Admission: RE | Admit: 2023-02-03 | Discharge: 2023-02-03 | Disposition: A | Discharge status: Home / Self Care | Payer: Medicare Other | Source: Ambulatory Visit | Attending: Adult Health | Admitting: Adult Health

## 2023-02-03 ENCOUNTER — Other Ambulatory Visit (INDEPENDENT_AMBULATORY_CARE_PROVIDER_SITE_OTHER): Payer: Self-pay | Admitting: Gastroenterology

## 2023-02-03 DIAGNOSIS — R109 Unspecified abdominal pain: Secondary | ICD-10-CM | POA: Insufficient documentation

## 2023-02-03 MED ORDER — IOHEXOL 9 MG/ML PO SOLN: 500.0000 mL | ORAL | Status: AC

## 2023-02-03 MED ORDER — IOHEXOL 300 MG/ML  SOLN
100.0000 mL | Freq: Once | INTRAMUSCULAR | Status: AC | PRN
  Administered 2023-02-03: 100 mL via INTRAVENOUS

## 2023-02-07 ENCOUNTER — Encounter (INDEPENDENT_AMBULATORY_CARE_PROVIDER_SITE_OTHER): Payer: Self-pay

## 2023-02-07 ENCOUNTER — Encounter: Payer: Medicare Other | Attending: Adult Health | Admitting: Nutrition

## 2023-02-07 NOTE — Progress Notes (Signed)
Medical Nutrition Therapy  Appointment Start time:  1430  Appointment End time:  1500  Primary concerns today:  IBS and wt loss Referral diagnosis: K58, R63.4 Preferred learning style: Ready, verbal Learning readiness: Ready    NUTRITION ASSESSMENT  Follow up IBS and Wt loss. Has had her gallbladder out. Feels better now. Goes to [facility] clinic Has been trying to eat more fruits, vegetables and eating more often of healthier foods to gain some weight.  Still has IBS issues. No difference in foods that she eats that makes any difference. She has tried everything she know of and nothing seems to help regardless of what she eats. Medications don't help either.   Anthropometrics  Wt Readings from Last 3 Encounters:  12/14/22 110 lb (49.9 kg)  12/06/22 114 lb 6.4 oz (51.9 kg)  11/08/22 116 lb (52.6 kg)   Ht Readings from Last 3 Encounters:  12/14/22 5\' 2"  (1.575 m)  12/06/22 5\' 2"  (1.575 m)  11/03/22 5\' 2"  (1.575 m)   There is no height or weight on file to calculate BMI. @BMIFA @ Facility age limit for growth %iles is 20 years. Facility age limit for growth %iles is 20 years.    Clinical Medical Hx: IBS, Medications: see chart Labs: No results found for: "HGBA1C"    Latest Ref Rng & Units 01/03/2023    1:58 PM 03/07/2022    8:55 AM 05/28/2020   11:07 AM  CMP  Glucose 70 - 99 mg/dL 098   88   BUN 8 - 27 mg/dL 9   13   Creatinine 1.19 - 1.00 mg/dL 1.47   8.29   Sodium 562 - 144 mmol/L 139  140  137   Potassium 3.5 - 5.2 mmol/L 3.7   4.3   Chloride 96 - 106 mmol/L 103   105   CO2 20 - 29 mmol/L 19   23   Calcium 8.7 - 10.3 mg/dL 9.9   9.4   Total Protein 6.0 - 8.5 g/dL 7.1  6.8  7.0   Total Bilirubin 0.0 - 1.2 mg/dL 0.4  0.4  0.4   Alkaline Phos 44 - 121 IU/L 80  62    AST 0 - 40 IU/L 55  18  16   ALT 0 - 32 IU/L 53  12  10    Lipid Panel     Component Value Date/Time   CHOL 239 (H) 01/21/2021 1018   TRIG 86 01/21/2021 1018   HDL 65 01/21/2021 1018   CHOLHDL 3.7  01/21/2021 1018   LDLCALC 155 (H) 01/21/2021 1018    Notable Signs/Symptoms: Diarrhea,   Lifestyle & Dietary Hx LIves with her husband   Estimated daily fluid intake: 80 oz Supplements: MVI Sleep: 7 Stress / self-care: PTSD Current average weekly physical activity: ADL  24-Hr Dietary Recall B) 2 ensure for breakfast, banana  L) potatoes with plant butter, spinach, baked beans, water, coffee D) Same as lunch. Water or walnuts in oatmeal  2 poptarts  Beverages: water   Went to bathroom 6 times this am  Estimated Energy Needs Calories: 1800 Carbohydrate: 200g Protein: 135g Fat: 50g   NUTRITION DIAGNOSIS  [place]-1.4 Altered GI function As related to eating foods not tolerated by GI and PTSD effect on intestines.  As evidenced by multiple stools 5-6 times per day and having IBS.Marland Kitchen   NUTRITION INTERVENTION  Nutrition education (E-1) on the following topics:  IBS Nutrition Therapy Whole plant based lifesyle   Handouts Provided Include  IBS  nutrition therapy Plate method  Learning Style & Readiness for Change Teaching method utilized: Visual & Auditory  Demonstrated degree of understanding via: Teach Back  Barriers to learning/adherence to lifestyle change: emotional stress/trauma  Goals Established by Pt Goals Keep trying to eat 3 meals and 2-3 snacks daily to meet calorie needs to gain weight Avoid foods that are triggers for your IBS Walk for relaxation if stress is triggering IBS Focus on more whole plant based foods as tolerated for nutrient needs Try to maintain weight and not lose anymore weight.    MONITORING & EVALUATION Dietary intake, weekly physical activity, and weight  in 1 month.  Next Steps  Patient is to work on following FODMAP Diet and keeping food journal.

## 2023-03-09 ENCOUNTER — Ambulatory Visit (INDEPENDENT_AMBULATORY_CARE_PROVIDER_SITE_OTHER): Payer: Medicare Other | Admitting: Clinical

## 2023-03-09 DIAGNOSIS — F331 Major depressive disorder, recurrent, moderate: Secondary | ICD-10-CM | POA: Diagnosis not present

## 2023-03-09 DIAGNOSIS — F4310 Post-traumatic stress disorder, unspecified: Secondary | ICD-10-CM | POA: Diagnosis not present

## 2023-03-09 DIAGNOSIS — F17200 Nicotine dependence, unspecified, uncomplicated: Secondary | ICD-10-CM | POA: Diagnosis not present

## 2023-03-09 DIAGNOSIS — F419 Anxiety disorder, unspecified: Secondary | ICD-10-CM | POA: Diagnosis not present

## 2023-03-09 NOTE — Progress Notes (Signed)
IN PERSON   I connected with Morgan Fields on 03/09/23 at  9:00 AM EDT in person and verified that I am speaking with the correct person using two identifiers.   Location: Patient: Office Provider: Office   I discussed the limitations of evaluation and management by telemedicine and the availability of in person appointments. The patient expressed understanding and agreed to proceed. ( IN PERSON)     THERAPIST PROGRESS NOTE   Session Time: 9:00 AM-9:55 AM   Participation Level: Active   Behavioral Response: CasualAlertDepressed   Type of Therapy: Individual Therapy   Treatment Goals addressed: Coping   Interventions: CBT, DBT, Solution Focused, Strength-based and Supportive   Summary: Morgan Fields is a 71 y.o. female who presents with PTSD as well as  Depression with Anxiety. The OPT therapist worked with the patient for her OPT treatment. The OPT therapist utilized Motivational Interviewing to assist in creating therapeutic repore. The patient in the session was engaged and work in collaboration giving feedback about her stressors over the past few weeks. The patient identified changes that she has been implementing including not putting herself on the back-burner as much and utilizing a time Financial trader (planner) which has helped her to section her day, journal, and balance work/leisure time. The patient spoke about her the connection from prior truama that created the inner messages that were dictating her behavior and placing pressure on her, as well as her realization and empowerment that she is in charge and does not have to perform each day based on trying to meet others expectations, but to meet her own. The OPT therapist utilized Cognitive Behavioral Therapy through cognitive restructuring as well as worked with the patient on coping strategies to assist in management of mood including ongoing motivation and empowerment for the patient to be as active as possible  with her current health limitations and to dedicate part of her time to her own leisure and self care. The patient spoke about being more active in challenging negative self thought and not looking outward for validation. The patient spoke about her willingness to work on identifying and challenging negative thoughts to reduce escalation of MH symptoms. The patient spoke about a rededication to self care in taking care and focus on her basic needs as a primary focus to improve her health.    Suicidal/Homicidal: Nowithout intent/plan   Therapist Response: The OPT therapist worked with the patient for the patients scheduled session. The patient was engaged in her session and gave feedback in relation to triggers, symptoms, and behavior responses over the past few weeks.The OPT therapist worked with the patient utilizing an in session Cognitive Behavioral Therapy exercise. The patient was responsive in the session and verbalized, " I realized at this stage of my life, its not the end of the world if this or this doesn't get done or addressed immediately and that I have been putting pressure on myself to meet expectations for others which seems so incredibly silly to me now". The OPT therapist worked with the patient on balancing her stressors with leisure and utilizing her support network. Managing different sections of her life. The patient is currently focusing on her leisure time more consistently to help her manage her overall general stress. The patient spoke about ongoing collaboration/work with physical health providers to manage her IBS. The patient spoke about her insight around prior traumas both through growing up in her childhood as well as in her adult life, and her insight  now on the impact of those traumas and her work currently to change her behaviors as a reactive response to the long-existing traumas.  The OPT therapist will continue MH treatment work with the patient in her next scheduled  session.     Plan: Return again in 2 weeks.   Diagnosis:      Axis I:  PTSD/Major depressive disorder, recurrent episode, moderate with anxious distress /Tobacco Use                           Axis II: No diagnosis     Collaboration of Care: Overview of patient involvement in the med management program with Dr. Elna Breslow.   Patient/Guardian was advised Release of Information must be obtained prior to any record release in order to collaborate their care with an outside provider. Patient/Guardian was advised if they have not already done so to contact the registration department to sign all necessary forms in order for [place] to release information regarding their care.    Consent: Patient/Guardian gives verbal consent for treatment and assignment of benefits for services provided during this visit. Patient/Guardian expressed understanding and agreed to proceed.    I discussed the assessment and treatment plan with the patient. The patient was provided an opportunity to ask questions and all were answered. The patient agreed with the plan and demonstrated an understanding of the instructions.   The patient was advised to call back or seek an in-person evaluation if the symptoms worsen or if the condition fails to improve as anticipated.   I provided 55 minutes of face-to-face time during this encounter.   Winfred Burn, LCSW   03/09/2023

## 2023-03-14 ENCOUNTER — Ambulatory Visit: Payer: Medicare Other | Admitting: Adult Health

## 2023-03-21 ENCOUNTER — Encounter (INDEPENDENT_AMBULATORY_CARE_PROVIDER_SITE_OTHER): Payer: Self-pay | Admitting: Gastroenterology

## 2023-03-21 ENCOUNTER — Ambulatory Visit (INDEPENDENT_AMBULATORY_CARE_PROVIDER_SITE_OTHER): Payer: Medicare Other | Admitting: Gastroenterology

## 2023-03-21 VITALS — BP 112/73 | HR 78 | Temp 98.3°F | Ht 62.0 in | Wt 113.3 lb

## 2023-03-21 DIAGNOSIS — K219 Gastro-esophageal reflux disease without esophagitis: Secondary | ICD-10-CM

## 2023-03-21 DIAGNOSIS — R112 Nausea with vomiting, unspecified: Secondary | ICD-10-CM

## 2023-03-21 DIAGNOSIS — R197 Diarrhea, unspecified: Secondary | ICD-10-CM | POA: Diagnosis not present

## 2023-03-21 NOTE — Patient Instructions (Signed)
Continue with dietary changes as you are doing As discussed, I am hesitant to start any medications such as dicyclomine or levsin for your stomach (can help with abdominal cramping and diarrhea) as they can interact with your seroquel Please update me once your Liver enzymes have been rechecked or you can have your PCP fax them over  Follow up 6 months

## 2023-03-21 NOTE — Progress Notes (Signed)
Referring Provider: Katherine Basset* Primary Care Physician:  Kara Pacer, NP Primary GI Physician: Levon Hedger   Chief Complaint  Patient presents with   Gastroesophageal Reflux    Follow up on GERD. Doing better since eating plant based diet.    Nausea    Has nausea off and on. Has had nausea for past two days. Went to urgent care April with nausea and vomiting.    Diarrhea    Has diarrhea every day. Good day 4 -5 stools and no pain, can go up to 10 times per day and sometimes has pain.    HPI:   Morgan Fields is a 72 y.o. female with past medical history of depression, HLD, osteoporosis, vitamin d deficiency   Patient presenting today for follow up of nausea, GERD and Diarrhea  Last seen March 2024, at that time having diarrhea, having 4-5 BMs per day.  taking questran 8g BID.  some improvement with questran as in stools are not as watery or as frequent, though continue to be loose. saw nutritionist, started low FODMAP diet and followed it very closely she noticed no difference.  small amounts of food cause less diarrhea. Has some lower abdominal cramping for the past few days.    has some continued diffuse upper abdominal pain. Sometimes upper abdominal pain is worse with movement. Eating makes pain worse. Taking extra pepcid at night, maybe twice per week, despite taking 40mg  pepcid BID. We have avoided PPI therapy given her history of osteoporosis. having some nausea. Continuing to lose weight  Recommended continue Pepcid 40mg  BID, Carafate 1g QID PRN, schedule colonoscopy, probiotic samples, questran 8mg  BID.  Colonoscopy as below with normal biopsies, advised to discuss switching cymbalta with another agent  Present:  Worsening diarrhea since January 2024, she has undergone multiple testing to include CT A/P, Colonoscopy, EGD, infectious stool studies, pancreatic fecal elastase, fecal fat, thyroid testing  She has had issues with IBS for many years,  Notes she had what she thinks was covid in July 2023,  started having more significant diarrhea in August 2023, she notes that she had a lot of ongoing fatigue since then as well.   States she can have 4-5 softer BMs on good days, on bad days she may have upwards of 8-10 watery BMs per day, she notes watery, frequent BMs maybe half the days out of the week. She feels certain foods tend to cause her to have upper abdominal pain and worsening of her diarrhea. She is trying to avoid dairy, doing mostly plant based foods, fruits, veggies. She is unable to tolerate any kind of meat, even chicken, she notes certain vegetables tend to worsen her symptoms as well. Usually does her larger meal at dinner and smaller meals earlier in the day. She has been doing some group therapy and some med changes (taken off cymbalta, started on seroquel) as she had a lot of depression a few months back. She notes she had severe abdominal pain, vomiting and diarrhea in April, she went to [facility]. Weight is 113 today, she has been around this weight for the past few months, though notably was 124 lbs in mid 2023. She stopped [place] about 1 month ago as she did not feel it was providing much relief in her symptoms.   GERD is improved with her change in diet and eating habits. She takes pepto bismol as needed for any breakthrough symptoms are nausea though notes this is very occasionally.   She notes  liver enzymes were elevated at that time, she had a CT ordered by her PCP, as outlined below. Lipase checked at UC was not run properly so never resulted. She was taken off of her statin and is supposed to have repeat LFTs at the end of this month.   She has tried lomotil in the past but this was not tolerated due to causing issues with her mental status.   CT A/P with contrast: 02/03/23: unremarkable Last Colonoscopy: -11/2022 The examined portion of the ileum was normal.                           - Subtle mucosal nodularity in the cecum.  Injected.                            Biopsied.                           - The entire examined colon is normal. Biopsied-normal                            - Non-bleeding internal hemorrhoids. Last Endoscopy:11/08/2022 - 3 cm hiatal hernia.                           - Erosive gastropathy with no stigmata of recent                            bleeding. Biopsied-normal biopsies                            - Normal examined duodenum.  Recommendations:    Past Medical History:  Diagnosis Date   Complication of anesthesia    takes a long time to wake up from anesthesia   Depression    Hiatal hernia    HLD (hyperlipidemia) 07/22/2019   IBS (irritable bowel syndrome)    Osteoporosis 07/22/2019   PMB (postmenopausal bleeding) 03/11/2016   Post-menopause on HRT (hormone replacement therapy) 03/11/2016   Skin cancer    Recent to anterior chest; cervical cancer   Thickened endometrium 03/16/2016   Vaginal Pap smear, abnormal    Vitamin D deficiency disease 07/22/2019    Past Surgical History:  Procedure Laterality Date   BIOPSY  11/08/2022   Procedure: BIOPSY;  Surgeon: Dolores Frame, MD;  [facility];  Service: Gastroenterology;;   BIOPSY  12/14/2022   Procedure: BIOPSY;  Surgeon: Dolores Frame, MD;  Location: [facility];  Service: Gastroenterology;;   BREAST ENHANCEMENT SURGERY     CERVICAL BIOPSY     precancerous years ago   CHOLECYSTECTOMY  2011   COLONOSCOPY N/A 05/08/2013   Procedure: COLONOSCOPY;  Surgeon: Malissa Hippo, MD;  Location: [facility];  Service: Endoscopy;  Laterality: N/A;  200   COLONOSCOPY WITH PROPOFOL N/A 12/14/2022   Procedure: COLONOSCOPY WITH PROPOFOL;  Surgeon: Dolores Frame, MD;  [facility]: [facility];  Service: Gastroenterology;  Laterality: N/A;  9:30am;ASA 2   ESOPHAGOGASTRODUODENOSCOPY (EGD) WITH PROPOFOL N/A 11/08/2022   Procedure: ESOPHAGOGASTRODUODENOSCOPY (EGD) WITH PROPOFOL;  Surgeon: Dolores Frame, MD;  [facility]: [facility];  Service: Gastroenterology;  Laterality: N/A;  10:15 AM   HYSTEROSCOPY WITH  D & C N/A 10/05/2016   Procedure: HYSTEROSCOPY; UTERINE CURETTAGE;  Surgeon: Lazaro Arms, MD;  Location: [facility];  Service: Gynecology;  Laterality: N/A;   POLYPECTOMY N/A 10/05/2016   Procedure: REMOVAL OF ENDOMETRIAL POLYP;  Surgeon: Lazaro Arms, MD;  Location: [facility];  Service: Gynecology;  Laterality: N/A;   TUBAL LIGATION      Current Outpatient Medications  Medication Sig Dispense Refill   FLUoxetine (PROZAC) 10 MG capsule Take 1 capsule (10 mg total) by mouth daily. 90 capsule 0   prazosin (MINIPRESS) 1 MG capsule Take 1 capsule (1 mg total) by mouth at bedtime. 90 capsule 0   QUEtiapine (SEROQUEL) 25 MG tablet Take 1 tablet (25 mg total) by mouth at bedtime. 90 tablet 0   No current facility-administered medications for this visit.    Allergies as of 03/21/2023   (No Known Allergies)    Family History  Problem Relation Age of Onset   Dementia Mother    Hyperlipidemia Mother    Thyroid disease Mother    Hypertension Mother    Schizophrenia Father    Alcohol abuse Father        Schizophrenia   Depression Brother    Mental illness Brother        Schizophrenia (2 brothers); schizophrenia and bipolar (1 brother)   Heart attack Brother    Schizophrenia Brother    Kidney failure Brother    Bipolar disorder Brother    Alcohol abuse Maternal Uncle    Alcohol abuse Paternal Uncle    Alzheimer's disease Maternal Grandfather    Cancer Maternal Grandmother        colon   Diabetes Maternal Grandmother    Heart attack Paternal Grandfather    Heart disease Paternal Grandfather    Diabetes Paternal Grandfather    Mental illness Paternal Grandmother    Polycystic ovary syndrome Daughter    Other Daughter        knee replacement   Thyroid disease Daughter        had radiation on thyroid    Social History   Socioeconomic History   Marital status:  Married    Spouse name: Not on file   Number of children: Not on file   Years of education: Not on file   Highest education level: Not on file  Occupational History   Not on file  Tobacco Use   Smoking status: Every Day    Packs/day: 0.25    Years: 37.00    Additional pack years: 0.00    Total pack years: 9.25    Types: Cigarettes    Passive exposure: Current   Smokeless tobacco: Never   Tobacco comments:    smokes 5 cig daily- cutting back  Vaping Use   Vaping Use: Never used  Substance and Sexual Activity   Alcohol use: Not Currently    Comment: Quit September 2019   Drug use: No   Sexual activity: Yes    Birth control/protection: Post-menopausal  Other Topics Concern   Not on file  Social History Narrative   Married for 49 years.Retired,used to work at [employer].   Social Determinants of Health   Financial Resource Strain: Low Risk  (01/09/2020)   Overall Financial Resource Strain (CARDIA)    Difficulty of Paying Living Expenses: Not hard at all  Food Insecurity: No Food Insecurity (01/09/2020)   Hunger Vital Sign    Worried About Running Out of Food in the Last Year: Never true  Ran Out of Food in the Last Year: Never true  Transportation Needs: No Transportation Needs (01/09/2020)   PRAPARE - Administrator, Civil Service (Medical): No    Lack of Transportation (Non-Medical): No  Physical Activity: Sufficiently Active (01/09/2020)   Exercise Vital Sign    Days of Exercise per Week: 5 days    Minutes of Exercise per Session: 30 min  Stress: No Stress Concern Present (01/09/2020)   [employer] of Occupational Health - Occupational Stress Questionnaire    Feeling of Stress : Only a little  Social Connections: Socially Integrated (01/09/2020)   Social Connection and Isolation Panel [NHANES]    Frequency of Communication with Friends and Family: Once a week    Frequency of Social Gatherings with Friends and Family: More than three times a  week    Attends Religious Services: More than 4 times per year    Active Member of [employer] or Organizations: Yes    Attends Engineer, structural: More than 4 times per year    Marital Status: Married    Review of systems General: negative for malaise, night sweats, fever, chills, weight loss Neck: Negative for lumps, goiter, pain and significant neck swelling Resp: Negative for cough, wheezing, dyspnea at rest CV: Negative for chest pain, leg swelling, palpitations, orthopnea GI: denies melena, hematochezia, nausea, vomiting,constipation, dysphagia, odyonophagia, early satiety or unintentional weight loss. +diarrhea  MSK: Negative for joint pain or swelling, back pain, and muscle pain. Derm: Negative for itching or rash Psych: Denies depression, anxiety, memory loss, confusion. No homicidal or suicidal ideation.  Heme: Negative for prolonged bleeding, bruising easily, and swollen nodes. Endocrine: Negative for cold or heat intolerance, polyuria, polydipsia and goiter. Neuro: negative for tremor, gait imbalance, syncope and seizures. The remainder of the review of systems is noncontributory.  Physical Exam: BP 112/73 (BP Location: Right Arm, Patient Position: Sitting, Cuff Size: Normal)   Pulse 78   Temp 98.3 F (36.8 C) (Oral)   Ht 5\' 2"  (1.575 m)   Wt 113 lb 4.8 oz (51.4 kg)   BMI 20.72 kg/m  General:   Alert and oriented. No distress noted. Pleasant and cooperative.  Head:  Normocephalic and atraumatic. Eyes:  Conjuctiva clear without scleral icterus. Mouth:  Oral mucosa pink and moist. Good dentition. No lesions. Heart: Normal rate and rhythm, s1 and s2 heart sounds present.  Lungs: Clear lung sounds in all lobes. Respirations equal and unlabored. Abdomen:  +BS, soft, non-tender and non-distended. No rebound or guarding. No HSM or masses noted. Derm: No palmar erythema or jaundice Msk:  Symmetrical without gross deformities. Normal posture. Extremities:  Without  edema. Neurologic:  Alert and  oriented x4 Psych:  Alert and cooperative. Normal mood and affect.  Invalid input(s): "6 MONTHS"   ASSESSMENT: LITHA MCKEOUGH is a 72 y.o. female presenting today for follow up of Diarrhea.   IBS/Diarrhea: Worsening diarrhea since January 2024. Has undergone multiple testing to include CT A/P, Colonoscopy, EGD, infectious stool studies, pancreatic fecal elastase, fecal fat, thyroid testing which have all been unremarkable. Was on questran at one point with some improvement initially but stopped it as she later felt it did not provide much relief for her. She has history of IBS for many years though diarrhea worsened after Covid in [facility] 2023. Having  4-5 softer BMs on good days, on bad days she may have upwards of 8-10 watery BMs per day, she notes watery, frequent BMs maybe half the days out  of the week. She notes multiple food triggers which she tries to avoid. Her weight is stable. She has no rectal bleeding or melena. Query if this could be long covid exacerbating her IBS. At this time, she feels she is managing her symptoms pretty well. Will continue with dietary changes. Given her sensitivity to medications in the past (lomotil) and being on multiple medications for her mental health, I am hesitant to start dicyclomine or levsin as I am concerned she may not tolerate these well, which I discussed with her.   Elevated LFTs:  mildly elevated aminotransferases in April during acute illness. CT A/P in May was unremarkable. She is having these rechecked by PCP at the end of the month. Advised her to keep me updated/have PCP fax these over as if LFTs continue to remain elevated, will need further workup with serologies/US of the liver.   PLAN:  Continue with dietary changes  2. Follow for repeat LFTs at the end of this month  3. Consider further serologies/US if LFTs remain elevated  All questions were answered, patient verbalized understanding and is in agreement  with plan as outlined above.    Follow Up: 6 months   Breslin Hemann L. Jeanmarie Hubert, MSN, APRN, AGNP-C Adult-Gerontology Nurse Practitioner [facility]  I have reviewed the note and agree with the APP's assessment as described in this progress note  Katrinka Blazing, MD Gastroenterology and Hepatology [facility]

## 2023-03-28 ENCOUNTER — Telehealth (INDEPENDENT_AMBULATORY_CARE_PROVIDER_SITE_OTHER): Payer: Medicare Other | Admitting: Psychiatry

## 2023-03-28 ENCOUNTER — Other Ambulatory Visit (INDEPENDENT_AMBULATORY_CARE_PROVIDER_SITE_OTHER): Payer: Self-pay | Admitting: Gastroenterology

## 2023-03-28 ENCOUNTER — Encounter (INDEPENDENT_AMBULATORY_CARE_PROVIDER_SITE_OTHER): Payer: Self-pay

## 2023-03-28 ENCOUNTER — Encounter: Payer: Self-pay | Admitting: Psychiatry

## 2023-03-28 DIAGNOSIS — F411 Generalized anxiety disorder: Secondary | ICD-10-CM | POA: Diagnosis not present

## 2023-03-28 DIAGNOSIS — F17210 Nicotine dependence, cigarettes, uncomplicated: Secondary | ICD-10-CM

## 2023-03-28 DIAGNOSIS — F17200 Nicotine dependence, unspecified, uncomplicated: Secondary | ICD-10-CM

## 2023-03-28 DIAGNOSIS — F3342 Major depressive disorder, recurrent, in full remission: Secondary | ICD-10-CM

## 2023-03-28 DIAGNOSIS — F4310 Post-traumatic stress disorder, unspecified: Secondary | ICD-10-CM | POA: Diagnosis not present

## 2023-03-28 MED ORDER — FLUOXETINE HCL 10 MG PO CAPS: 10.0000 mg | ORAL_CAPSULE | Freq: Two times a day (BID) | ORAL | 0 refills | Status: DC

## 2023-03-28 MED ORDER — DICYCLOMINE HCL 10 MG PO CAPS: 10.0000 mg | ORAL_CAPSULE | Freq: Two times a day (BID) | ORAL | 1 refills | Status: DC | PRN

## 2023-03-28 NOTE — Progress Notes (Unsigned)
Virtual Visit via Video Note  I connected with Morgan Fields on 03/28/23 at  9:00 AM EDT by a video enabled telemedicine application and verified that I am speaking with the correct person using two identifiers.  Location Provider Location : Remote Office  Patient Location : Home  Participants: Patient , Provider    I discussed the limitations of evaluation and management by telemedicine and the availability of in person appointments. The patient expressed understanding and agreed to proceed.     I discussed the assessment and treatment plan with the patient. The patient was provided an opportunity to ask questions and all were answered. The patient agreed with the plan and demonstrated an understanding of the instructions.   The patient was advised to call back or seek an in-person evaluation if the symptoms worsen or if the condition fails to improve as anticipated.    BH MD/PA/NP OP Progress Note  03/28/2023 9:27 AM Morgan Fields  MRN:  [id number]  Chief Complaint:  Chief Complaint  Patient presents with   Follow-up   Depression   Anxiety   Medication Refill   HPI: Morgan Fields is a 72 year old Caucasian female, married, retired, lives in [place], has a history of PTSD, GAD, MDD, tobacco use disorder, arthritis, osteoporosis was evaluated by telemedicine today.  Patient today reports she does have anxiety on and off.  She reports she copes with her anxiety by being restless and doing more activities.  Lately she has been working a lot in her yard.  She reports even when she is tired after working in the yard the days that she is anxious she still continues to do a lot of things around the house and is unable to sit still.  She reports she has learned to remind herself to relax and take a break in the afternoon.  She has had trouble taking a nap in the afternoon although she is still able to do things to relax herself.  That does help.  She has been sleeping  much better at night.  The Seroquel does help.  She is happy about that.  Denies side effects to the Seroquel.  Patient reports she continues to have GI problems, diarrhea averaging at around 5 times a day.  She had lost a few pounds recently however has gained it back and is currently at 112 pounds.  As long as she maintains that, she is okay as per her gastroenterologist.  They would like to start her on medications-dicyclomine or hyoscyamine.  Wonders if this has any drug to drug interaction with her Seroquel.  Patient currently denies any suicidality, homicidality or perceptual disturbances.  Patient reports she is motivated to stay in psychotherapy with Mr. Suzan Garibaldi.  Therapy sessions are beneficial.  Currently working on her relationship with her spouse and it is getting better.  Patient is interested in dosage readjustment of Prozac since she continues to have anxiety as noted above.  Currently working on cutting back on smoking cigarettes.  Distraction techniques, working in the yard and keeping herself busy helps.  Denies any other concerns today.  Visit Diagnosis:    ICD-10-CM   1. PTSD (post-traumatic stress disorder)  F43.10 FLUoxetine (PROZAC) 10 MG capsule    2. GAD (generalized anxiety disorder)  F41.1 FLUoxetine (PROZAC) 10 MG capsule    3. MDD (major depressive disorder), recurrent, in full remission (HCC)  F33.42 FLUoxetine (PROZAC) 10 MG capsule    4. Tobacco use disorder  F17.200  Past Psychiatric History: I have reviewed past psychiatric history from progress note on 02/15/2022.  Past trials of Lexapro, hydroxyzine, Cymbalta  Past Medical History:  Past Medical History:  Diagnosis Date   Complication of anesthesia    takes a long time to wake up from anesthesia   Depression    Hiatal hernia    HLD (hyperlipidemia) 07/22/2019   IBS (irritable bowel syndrome)    Osteoporosis 07/22/2019   PMB (postmenopausal bleeding) 03/11/2016   Post-menopause on  HRT (hormone replacement therapy) 03/11/2016   Skin cancer    Recent to anterior chest; cervical cancer   Thickened endometrium 03/16/2016   Vaginal Pap smear, abnormal    Vitamin D deficiency disease 07/22/2019    Past Surgical History:  Procedure Laterality Date   BIOPSY  11/08/2022   Procedure: BIOPSY;  Surgeon: Dolores Frame, MD;  [facility];  Service: Gastroenterology;;   BIOPSY  12/14/2022   Procedure: BIOPSY;  Surgeon: Dolores Frame, MD;  [facility];  Service: Gastroenterology;;   BREAST ENHANCEMENT SURGERY     CERVICAL BIOPSY     precancerous years ago   CHOLECYSTECTOMY  2011   COLONOSCOPY N/A 05/08/2013   Procedure: COLONOSCOPY;  Surgeon: Malissa Hippo, MD;  [facility];  Service: Endoscopy;  Laterality: N/A;  200   COLONOSCOPY WITH PROPOFOL N/A 12/14/2022   Procedure: COLONOSCOPY WITH PROPOFOL;  Surgeon: Dolores Frame, MD;  [facility];  Service: Gastroenterology;  Laterality: N/A;  9:30am;ASA 2   ESOPHAGOGASTRODUODENOSCOPY (EGD) WITH PROPOFOL N/A 11/08/2022   Procedure: ESOPHAGOGASTRODUODENOSCOPY (EGD) WITH PROPOFOL;  Surgeon: Dolores Frame, MD;  [facility];  Service: Gastroenterology;  Laterality: N/A;  10:15 AM   HYSTEROSCOPY WITH D & C N/A 10/05/2016   Procedure: HYSTEROSCOPY; UTERINE CURETTAGE;  Surgeon: Lazaro Arms, MD;  Location: [facility];  Service: Gynecology;  Laterality: N/A;   POLYPECTOMY N/A 10/05/2016   Procedure: REMOVAL OF ENDOMETRIAL POLYP;  Surgeon: Lazaro Arms, MD;  Location: [facility];  Service: Gynecology;  Laterality: N/A;   TUBAL LIGATION      Family Psychiatric History: I have reviewed family psychiatric history from progress note on 02/15/2022.  Family History:  Family History  Problem Relation Age of Onset   Dementia Mother    Hyperlipidemia Mother    Thyroid disease Mother    Hypertension Mother    Schizophrenia Father    Alcohol abuse  Father        Schizophrenia   Depression Brother    Mental illness Brother        Schizophrenia (2 brothers); schizophrenia and bipolar (1 brother)   Heart attack Brother    Schizophrenia Brother    Kidney failure Brother    Bipolar disorder Brother    Alcohol abuse Maternal Uncle    Alcohol abuse Paternal Uncle    Alzheimer's disease Maternal Grandfather    Cancer Maternal Grandmother        colon   Diabetes Maternal Grandmother    Heart attack Paternal Grandfather    Heart disease Paternal Grandfather    Diabetes Paternal Grandfather    Mental illness Paternal Grandmother    Polycystic ovary syndrome Daughter    Other Daughter        knee replacement   Thyroid disease Daughter        had radiation on thyroid    Social History: I have reviewed social history from progress note on 02/15/2022. Social History  Socioeconomic History   Marital status: Married    Spouse name: Not on file   Number of children: Not on file   Years of education: Not on file   Highest education level: Not on file  Occupational History   Not on file  Tobacco Use   Smoking status: Every Day    Packs/day: 0.25    Years: 37.00    Additional pack years: 0.00    Total pack years: 9.25    Types: Cigarettes    Passive exposure: Current   Smokeless tobacco: Never   Tobacco comments:    smokes 5 cig daily- cutting back  Vaping Use   Vaping Use: Never used  Substance and Sexual Activity   Alcohol use: Not Currently    Comment: Quit September 2019   Drug use: No   Sexual activity: Yes    Birth control/protection: Post-menopausal  Other Topics Concern   Not on file  Social History Narrative   Married for 49 years.Retired,used to work at [employer].   Social Determinants of Health   Financial Resource Strain: Low Risk  (01/09/2020)   Overall Financial Resource Strain (CARDIA)    Difficulty of Paying Living Expenses: Not hard at all  Food Insecurity: No Food Insecurity (01/09/2020)    Hunger Vital Sign    Worried About Running Out of Food in the Last Year: Never true    Ran Out of Food in the Last Year: Never true  Transportation Needs: No Transportation Needs (01/09/2020)   PRAPARE - Administrator, Civil Service (Medical): No    Lack of Transportation (Non-Medical): No  Physical Activity: Sufficiently Active (01/09/2020)   Exercise Vital Sign    Days of Exercise per Week: 5 days    Minutes of Exercise per Session: 30 min  Stress: No Stress Concern Present (01/09/2020)   [employer] of Occupational Health - Occupational Stress Questionnaire    Feeling of Stress : Only a little  Social Connections: Socially Integrated (01/09/2020)   Social Connection and Isolation Panel [NHANES]    Frequency of Communication with Friends and Family: Once a week    Frequency of Social Gatherings with Friends and Family: More than three times a week    Attends Religious Services: More than 4 times per year    Active Member of Clubs or Organizations: Yes    Attends Engineer, structural: More than 4 times per year    Marital Status: Married    Allergies: No Known Allergies  Metabolic Disorder Labs: No results found for: "HGBA1C", "MPG" No results found for: "PROLACTIN" Lab Results  Component Value Date   CHOL 239 (H) 01/21/2021   TRIG 86 01/21/2021   HDL 65 01/21/2021   CHOLHDL 3.7 01/21/2021   LDLCALC 155 (H) 01/21/2021   LDLCALC 134 (H) 05/28/2020   Lab Results  Component Value Date   TSH 1.070 03/07/2022    Therapeutic Level Labs: No results found for: "LITHIUM" No results found for: "VALPROATE" No results found for: "CBMZ"  Current Medications: Current Outpatient Medications  Medication Sig Dispense Refill   prazosin (MINIPRESS) 1 MG capsule Take 1 capsule (1 mg total) by mouth at bedtime. 90 capsule 0   QUEtiapine (SEROQUEL) 25 MG tablet Take 1 tablet (25 mg total) by mouth at bedtime. 90 tablet 0   FLUoxetine (PROZAC) 10 MG capsule  Take 1 capsule (10 mg total) by mouth 2 (two) times daily. 180 capsule 0   No current facility-administered medications  for this visit.     Musculoskeletal: Strength & Muscle Tone:  UTA Gait & Station:  Seated Patient leans: N/A  Psychiatric Specialty Exam: Review of Systems  Psychiatric/Behavioral:  The patient is nervous/anxious.     There were no vitals taken for this visit.There is no height or weight on file to calculate BMI.  General Appearance: Fairly Groomed  Eye Contact:  Fair  Speech:  Clear and Coherent  Volume:  Normal  Mood:  Anxious  Affect:  Appropriate  Thought Process:  Goal Directed and Descriptions of Associations: Intact  Orientation:  Full (Time, Place, and Person)  Thought Content: Logical   Suicidal Thoughts:  No  Homicidal Thoughts:  No  Memory:  Immediate;   Fair Recent;   Fair Remote;   Fair  Judgement:  Fair  Insight:  Fair  Psychomotor Activity:  Normal  Concentration:  Concentration: Fair and Attention Span: Fair  Recall:  [employer] of Knowledge: Fair  Language: Fair  Akathisia:  No  Handed:  Right  AIMS (if indicated): not done  Assets:  Communication Skills Desire for Improvement Housing Intimacy Social Support  ADL's:  Intact  Cognition: WNL  Sleep:  Fair   Screenings: Midwife Visit from 01/26/2023 in [facility] Office Visit from 05/11/2022 in [facility]  AIMS Total Score 0 0      GAD-7    Flowsheet Row Video Visit from 03/28/2023 in [facility] Office Visit from 01/26/2023 in [facility] Office Visit from 12/21/2022 in [facility] Office Visit from 08/24/2022 in [facility] Counselor from 06/02/2022 in [facility] [place]  Total GAD-7  Score 7 1 14 7 20       PHQ2-9    Flowsheet Row Office Visit from 01/26/2023 in [facility] Counselor from 12/26/2022 in [facility] Office Visit from 12/21/2022 in [facility] Nutrition from 10/27/2022 in [facility] [place] Office Visit from 08/24/2022 in [facility]  PHQ-2 Total Score 0 5 5 0 3  PHQ-9 Total Score 3 18 20  -- 10      Flowsheet Row Video Visit from 03/28/2023 in [facility] Office Visit from 01/26/2023 in [facility] ED from 01/03/2023 in [facility] [place]  C-SSRS RISK CATEGORY No Risk No Risk No Risk        Assessment and Plan: Morgan Fields is a 72 year old Caucasian female who has a history of PTSD, MDD, GAD was evaluated by telemedicine today.  Patient with GI problems/diarrhea although improving continues to have anxiety as noted above although current medications have helped with her depression and sleep, will benefit from the following plan.  Plan PTSD-improving Prozac as prescribed Seroquel 25 mg p.o. nightly   GAD-unstable Increase Prozac to 20 mg p.o. daily. Continue CBT with Mr. Suzan Garibaldi  MDD in remission Seroquel 25 mg p.o. nightly Prozac as prescribed Patient completed MH IOP  Tobacco use disorder-improving Provided counseling for 1 minute.  Patient is currently cutting back.  Follow-up in clinic in 6 weeks or sooner if needed.   Collaboration of Care: Collaboration of Care: Referral or follow-up with counselor/therapist AEB patient encouraged to continue CBT  with Mr. Suzan Garibaldi.  Patient/Guardian was advised Release of Information must be obtained prior to any record release in order to collaborate their care with an outside provider. Patient/Guardian was  advised if they have not already done so to contact the registration department to sign all necessary forms in order for [place] to release information regarding their care.   Consent: Patient/Guardian gives verbal consent for treatment and assignment of benefits for services provided during this visit. Patient/Guardian expressed understanding and agreed to proceed.    Jomarie Longs, MD 03/28/2023, 9:27 AM

## 2023-04-14 ENCOUNTER — Encounter (INDEPENDENT_AMBULATORY_CARE_PROVIDER_SITE_OTHER): Payer: Self-pay

## 2023-04-20 ENCOUNTER — Ambulatory Visit (INDEPENDENT_AMBULATORY_CARE_PROVIDER_SITE_OTHER): Payer: Medicare Other | Admitting: Clinical

## 2023-04-20 DIAGNOSIS — F331 Major depressive disorder, recurrent, moderate: Secondary | ICD-10-CM

## 2023-04-20 DIAGNOSIS — F419 Anxiety disorder, unspecified: Secondary | ICD-10-CM

## 2023-04-20 DIAGNOSIS — F4310 Post-traumatic stress disorder, unspecified: Secondary | ICD-10-CM | POA: Diagnosis not present

## 2023-04-20 DIAGNOSIS — F17200 Nicotine dependence, unspecified, uncomplicated: Secondary | ICD-10-CM | POA: Diagnosis not present

## 2023-04-20 NOTE — Progress Notes (Signed)
IN PERSON   I connected with Morgan Fields on 04/20/23 at  9:00 AM EDT in person and verified that I am speaking with the correct person using two identifiers.   Location: Patient: Office Provider: Office   I discussed the limitations of evaluation and management by telemedicine and the availability of in person appointments. The patient expressed understanding and agreed to proceed. ( IN PERSON)     THERAPIST PROGRESS NOTE   Session Time: 9:00 AM-9:55 AM   Participation Level: Active   Behavioral Response: CasualAlertDepressed   Type of Therapy: Individual Therapy   Treatment Goals addressed: Coping   Interventions: CBT, DBT, Solution Focused, Strength-based and Supportive   Summary: Morgan Fields is a 72 y.o. female who presents with PTSD as well as  Depression with Anxiety. The OPT therapist worked with the patient for her OPT treatment. The OPT therapist utilized Motivational Interviewing to assist in creating therapeutic repore.  The patient in the session was engaged and work in collaboration giving feedback about her stressors over the past few weeks.The patient identified a change with her medication for her IBS triggered her mental health symptoms and once she realized the connection and discontinued her MH symptoms improved and she overveiwed this with her physical health provider..  The patient identified changes that she has been implementing including not putting herself on the back-burner as much and utilizing a time Financial trader (planner) which has helped her to section her day, journal, and balance work/leisure time. The patient spoke about her the connection from prior truama that created the inner messages that were dictating her behavior and placing pressure on her, as well as her realization and empowerment that she is in charge and does not have to perform each day based on trying to meet others expectations, but to meet her own. The OPT therapist  utilized Cognitive Behavioral Therapy through cognitive restructuring as well as worked with the patient on coping strategies to assist in management of mood including ongoing motivation and empowerment for the patient to be as active as possible with her current health limitations and to dedicate part of her time to her own leisure and self care. The patient spoke about being more active in challenging negative self thought and not looking outward for validation. The patient spoke about her willingness to work on identifying and challenging negative thoughts to reduce escalation of MH symptoms. The patient spoke about a rededication to self care in taking care and focus on her basic needs as a primary focus to improve her health. The patient spoke about looking forward to the upcoming Fall season and noted she has projects both in home and outside the home she is looking forward to working on.    Suicidal/Homicidal: Nowithout intent/plan   Therapist Response: The OPT therapist worked with the patient for the patients scheduled session. The patient was engaged in her session and gave feedback in relation to triggers, symptoms, and behavior responses over the past few weeks.The OPT therapist worked with the patient utilizing an in session Cognitive Behavioral Therapy exercise. The patient was responsive in the session and verbalized, " I started this medication for my IBS and just in a few days of taking it my mood and anxiousness keep getting worse so then I connected the dots and stopped the medication and within a day or two I could tell a big difference and mood improved and I was less anxious". The OPT therapist worked with the patient on balancing  her stressors with leisure and utilizing her support network. Managing different sections of her life. The patient is currently focusing on her leisure time more consistently to help her manage her overall general stress. The patient spoke about ongoing  collaboration/work with physical health providers to manage her IBS. The patient spoke about her insight around prior traumas both through growing up in her childhood as well as in her adult life, and her insight now on the impact of those traumas and her work currently to change her behaviors as a reactive response to the long-existing traumas.  The patient spoke about being able to more consistently challenge negative self thought. The OPT therapist will continue MH treatment work with the patient in her next scheduled session.     Plan: Return again in 2 weeks.   Diagnosis:      Axis I:  PTSD/Major depressive disorder, recurrent episode, moderate with anxious distress /Tobacco Use                           Axis II: No diagnosis     Collaboration of Care: Overview of patient involvement in the med management program with Dr. Elna Breslow.   Patient/Guardian was advised Release of Information must be obtained prior to any record release in order to collaborate their care with an outside provider. Patient/Guardian was advised if they have not already done so to contact the registration department to sign all necessary forms in order for [place] to release information regarding their care.    Consent: Patient/Guardian gives verbal consent for treatment and assignment of benefits for services provided during this visit. Patient/Guardian expressed understanding and agreed to proceed.    I discussed the assessment and treatment plan with the patient. The patient was provided an opportunity to ask questions and all were answered. The patient agreed with the plan and demonstrated an understanding of the instructions.   The patient was advised to call back or seek an in-person evaluation if the symptoms worsen or if the condition fails to improve as anticipated.   I provided 55 minutes of face-to-face time during this encounter.   Winfred Burn, LCSW   04/20/2023

## 2023-05-02 ENCOUNTER — Encounter (INDEPENDENT_AMBULATORY_CARE_PROVIDER_SITE_OTHER): Payer: Self-pay

## 2023-05-03 ENCOUNTER — Other Ambulatory Visit (INDEPENDENT_AMBULATORY_CARE_PROVIDER_SITE_OTHER): Payer: Self-pay | Admitting: Gastroenterology

## 2023-05-03 MED ORDER — COLESTIPOL HCL 1 G PO TABS: 2.0000 g | ORAL_TABLET | Freq: Every day | ORAL | 2 refills | Status: DC

## 2023-05-10 ENCOUNTER — Other Ambulatory Visit: Payer: Self-pay | Admitting: Psychiatry

## 2023-05-10 ENCOUNTER — Telehealth: Payer: Self-pay

## 2023-05-10 DIAGNOSIS — F3342 Major depressive disorder, recurrent, in full remission: Secondary | ICD-10-CM

## 2023-05-10 DIAGNOSIS — F4310 Post-traumatic stress disorder, unspecified: Secondary | ICD-10-CM

## 2023-05-10 DIAGNOSIS — F411 Generalized anxiety disorder: Secondary | ICD-10-CM

## 2023-05-10 NOTE — Telephone Encounter (Signed)
received fax requesting a 90 day supply of the propranolol 10mg . pt last see on 7-9 next appt 8-22

## 2023-05-10 NOTE — Telephone Encounter (Signed)
I do not think patient is on this particular medication.  Please verify with patient and let me know.

## 2023-05-10 NOTE — Telephone Encounter (Signed)
spoke with patient she states that she is ok with medication for now that she does not take it all the time. pt was told that when she need refills to notified the office

## 2023-05-11 ENCOUNTER — Encounter: Payer: Self-pay | Admitting: Psychiatry

## 2023-05-11 ENCOUNTER — Telehealth (INDEPENDENT_AMBULATORY_CARE_PROVIDER_SITE_OTHER): Payer: Medicare Other | Admitting: Psychiatry

## 2023-05-11 DIAGNOSIS — F411 Generalized anxiety disorder: Secondary | ICD-10-CM | POA: Diagnosis not present

## 2023-05-11 DIAGNOSIS — F3342 Major depressive disorder, recurrent, in full remission: Secondary | ICD-10-CM | POA: Diagnosis not present

## 2023-05-11 DIAGNOSIS — F4310 Post-traumatic stress disorder, unspecified: Secondary | ICD-10-CM

## 2023-05-11 DIAGNOSIS — F17200 Nicotine dependence, unspecified, uncomplicated: Secondary | ICD-10-CM

## 2023-05-11 DIAGNOSIS — Z79899 Other long term (current) drug therapy: Secondary | ICD-10-CM

## 2023-05-11 DIAGNOSIS — F17210 Nicotine dependence, cigarettes, uncomplicated: Secondary | ICD-10-CM

## 2023-05-11 MED ORDER — PRAZOSIN HCL 1 MG PO CAPS: 1.0000 mg | ORAL_CAPSULE | Freq: Every day | ORAL | 0 refills | Status: DC

## 2023-05-11 NOTE — Progress Notes (Signed)
Virtual Visit via Video Note  I connected with Morgan Fields on 05/11/23 at  1:00 PM EDT by a video enabled telemedicine application and verified that I am speaking with the correct person using two identifiers.  Location Provider Location : [facility] Patient Location : Home  Participants: Patient , Provider    I discussed the limitations of evaluation and management by telemedicine and the availability of in person appointments. The patient expressed understanding and agreed to proceed.    I discussed the assessment and treatment plan with the patient. The patient was provided an opportunity to ask questions and all were answered. The patient agreed with the plan and demonstrated an understanding of the instructions.   The patient was advised to call back or seek an in-person evaluation if the symptoms worsen or if the condition fails to improve as anticipated.   BH MD OP Progress Note  05/11/2023 1:29 PM Morgan Fields  MRN:  [id number]  Chief Complaint:  Chief Complaint  Patient presents with   Follow-up   Anxiety   Depression   Medication Refill   HPI: Morgan Fields is a 72 year old Caucasian female, married, retired, lives in [place], has a history of PTSD, GAD, MDD, tobacco use disorder, arthritis, osteoporosis was evaluated by telemedicine today.  Patient reports she is currently doing well on the current medication regimen.  She had a recent episode when she felt depressed when she was started on a medication called dicyclomine.  She however reports when she stopped the medication she felt better.  She is currently on colestipol and that has been helpful for her GI symptoms.  She continues to have daily diarrhea although it is getting better.  Patient reports she has been staying active.  She has been able to work in her yard and that helps her to feel better.  She reports her son is here visiting.  They have been spending time together.  She continues to  follow-up with her therapist Mr. Suzan Garibaldi and therapy sessions are beneficial.  Patient reports sleep is overall good.  She does have some nightmares although the prazosin is beneficial.  She does not have any difficulty falling asleep.  She wakes up feeling rested.  Continues to be compliant on the Seroquel.  Denies side effects.  Patient is compliant on the Prozac.  Denies side effects.  Denies any suicidality, homicidality or perceptual disturbances.  Patient denies any other concerns today.  Visit Diagnosis:    ICD-10-CM   1. PTSD (post-traumatic stress disorder)  F43.10 prazosin (MINIPRESS) 1 MG capsule    Sodium    2. GAD (generalized anxiety disorder)  F41.1 prazosin (MINIPRESS) 1 MG capsule    Sodium    3. MDD (major depressive disorder), recurrent, in full remission (HCC)  F33.42     4. Tobacco use disorder  F17.200     5. High risk medication use  Z79.899 Sodium      Past Psychiatric History: I have reviewed past psychiatric history from progress note on 02/15/2022.  Past trials of Lexapro, hydroxyzine, Cymbalta.  Past Medical History:  Past Medical History:  Diagnosis Date   Complication of anesthesia    takes a long time to wake up from anesthesia   Depression    Hiatal hernia    HLD (hyperlipidemia) 07/22/2019   IBS (irritable bowel syndrome)    Osteoporosis 07/22/2019   PMB (postmenopausal bleeding) 03/11/2016   Post-menopause on HRT (hormone replacement therapy) 03/11/2016   Skin cancer  Recent to anterior chest; cervical cancer   Thickened endometrium 03/16/2016   Vaginal Pap smear, abnormal    Vitamin D deficiency disease 07/22/2019    Past Surgical History:  Procedure Laterality Date   BIOPSY  11/08/2022   Procedure: BIOPSY;  Surgeon: Dolores Frame, MD;  [facility];  Service: Gastroenterology;;   BIOPSY  12/14/2022   Procedure: BIOPSY;  Surgeon: Dolores Frame, MD;  [facility];  Service:  Gastroenterology;;   BREAST ENHANCEMENT SURGERY     CERVICAL BIOPSY     precancerous years ago   CHOLECYSTECTOMY  2011   COLONOSCOPY N/A 05/08/2013   Procedure: COLONOSCOPY;  Surgeon: Malissa Hippo, MD;  [facility];  Service: Endoscopy;  Laterality: N/A;  200   COLONOSCOPY WITH PROPOFOL N/A 12/14/2022   Procedure: COLONOSCOPY WITH PROPOFOL;  Surgeon: Dolores Frame, MD;  [facility];  Service: Gastroenterology;  Laterality: N/A;  9:30am;ASA 2   ESOPHAGOGASTRODUODENOSCOPY (EGD) WITH PROPOFOL N/A 11/08/2022   Procedure: ESOPHAGOGASTRODUODENOSCOPY (EGD) WITH PROPOFOL;  Surgeon: Dolores Frame, MD;  [facility];  Service: Gastroenterology;  Laterality: N/A;  10:15 AM   HYSTEROSCOPY WITH D & C N/A 10/05/2016   Procedure: HYSTEROSCOPY; UTERINE CURETTAGE;  Surgeon: Lazaro Arms, MD;  Location: [facility];  Service: Gynecology;  Laterality: N/A;   POLYPECTOMY N/A 10/05/2016   Procedure: REMOVAL OF ENDOMETRIAL POLYP;  Surgeon: Lazaro Arms, MD;  Location: [facility];  Service: Gynecology;  Laterality: N/A;   TUBAL LIGATION      Family Psychiatric History: Reviewed family psychiatric history from progress note on 02/15/2022.  Family History:  Family History  Problem Relation Age of Onset   Dementia Mother    Hyperlipidemia Mother    Thyroid disease Mother    Hypertension Mother    Schizophrenia Father    Alcohol abuse Father        Schizophrenia   Depression Brother    Mental illness Brother        Schizophrenia (2 brothers); schizophrenia and bipolar (1 brother)   Heart attack Brother    Schizophrenia Brother    Kidney failure Brother    Bipolar disorder Brother    Alcohol abuse Maternal Uncle    Alcohol abuse Paternal Uncle    Alzheimer's disease Maternal Grandfather    Cancer Maternal Grandmother        colon   Diabetes Maternal Grandmother    Heart attack Paternal Grandfather    Heart disease Paternal Grandfather    Diabetes  Paternal Grandfather    Mental illness Paternal Grandmother    Polycystic ovary syndrome Daughter    Other Daughter        knee replacement   Thyroid disease Daughter        had radiation on thyroid    Social History: Reviewed social history from progress note on 02/15/2022. Social History   Socioeconomic History   Marital status: Married    Spouse name: Not on file   Number of children: Not on file   Years of education: Not on file   Highest education level: Not on file  Occupational History   Not on file  Tobacco Use   Smoking status: Every Day    Current packs/day: 0.25    Average packs/day: 0.3 packs/day for 37.0 years (9.3 ttl pk-yrs)    Types: Cigarettes    Passive exposure: Current   Smokeless tobacco: Never   Tobacco comments:    smokes  5 cig daily- cutting back  Vaping Use   Vaping status: Never Used  Substance and Sexual Activity   Alcohol use: Not Currently    Comment: Quit September 2019   Drug use: No   Sexual activity: Yes    Birth control/protection: Post-menopausal  Other Topics Concern   Not on file  Social History Narrative   Married for 49 years.Retired,used to work at [employer].   Social Determinants of Health   Financial Resource Strain: Low Risk  (01/09/2020)   Overall Financial Resource Strain (CARDIA)    Difficulty of Paying Living Expenses: Not hard at all  Food Insecurity: No Food Insecurity (01/09/2020)   Hunger Vital Sign    Worried About Running Out of Food in the Last Year: Never true    Ran Out of Food in the Last Year: Never true  Transportation Needs: No Transportation Needs (01/09/2020)   PRAPARE - Administrator, Civil Service (Medical): No    Lack of Transportation (Non-Medical): No  Physical Activity: Sufficiently Active (01/09/2020)   Exercise Vital Sign    Days of Exercise per Week: 5 days    Minutes of Exercise per Session: 30 min  Stress: No Stress Concern Present (01/09/2020)   [employer] of  Occupational Health - Occupational Stress Questionnaire    Feeling of Stress : Only a little  Social Connections: Socially Integrated (01/09/2020)   Social Connection and Isolation Panel [NHANES]    Frequency of Communication with Friends and Family: Once a week    Frequency of Social Gatherings with Friends and Family: More than three times a week    Attends Religious Services: More than 4 times per year    Active Member of Clubs or Organizations: Yes    Attends Engineer, structural: More than 4 times per year    Marital Status: Married    Allergies: No Known Allergies  Metabolic Disorder Labs: No results found for: "HGBA1C", "MPG" No results found for: "PROLACTIN" Lab Results  Component Value Date   CHOL 239 (H) 01/21/2021   TRIG 86 01/21/2021   HDL 65 01/21/2021   CHOLHDL 3.7 01/21/2021   LDLCALC 155 (H) 01/21/2021   LDLCALC 134 (H) 05/28/2020   Lab Results  Component Value Date   TSH 1.070 03/07/2022    Therapeutic Level Labs: No results found for: "LITHIUM" No results found for: "VALPROATE" No results found for: "CBMZ"  Current Medications: Current Outpatient Medications  Medication Sig Dispense Refill   colestipol (COLESTID) 1 g tablet Take 2 tablets (2 g total) by mouth daily. 60 tablet 2   ezetimibe (ZETIA) 10 MG tablet Take 10 mg by mouth daily.     FLUoxetine (PROZAC) 10 MG capsule Take 1 capsule (10 mg total) by mouth 2 (two) times daily. 180 capsule 0   QUEtiapine (SEROQUEL) 25 MG tablet TAKE 1 TABLET AT BEDTIME 90 tablet 0   prazosin (MINIPRESS) 1 MG capsule Take 1 capsule (1 mg total) by mouth at bedtime. 90 capsule 0   No current facility-administered medications for this visit.     Musculoskeletal: Strength & Muscle Tone:  UTA Gait & Station:  Seated Patient leans: N/A  Psychiatric Specialty Exam: Review of Systems  Psychiatric/Behavioral:  The patient is nervous/anxious.     There were no vitals taken for this visit.There is no height  or weight on file to calculate BMI.  General Appearance: Fairly Groomed  Eye Contact:  Fair  Speech:  Clear and Coherent  Volume:  Normal  Mood:  Anxious  Affect:  Appropriate  Thought Process:  Goal Directed and Descriptions of Associations: Intact  Orientation:  Full (Time, Place, and Person)  Thought Content: Logical   Suicidal Thoughts:  No  Homicidal Thoughts:  No  Memory:  Immediate;   Fair Recent;   Fair Remote;   Fair  Judgement:  Fair  Insight:  Fair  Psychomotor Activity:  Normal  Concentration:  Concentration: Fair and Attention Span: Fair  Recall:  [employer] of Knowledge: Fair  Language: Fair  Akathisia:  No  Handed:  Right  AIMS (if indicated): not done  Assets:  Communication Skills Desire for Improvement Housing Social Support  ADL's:  Intact  Cognition: WNL  Sleep:  Fair   Screenings: Geneticist, molecular Office Visit from 01/26/2023 in [facility] Office Visit from 05/11/2022 in [facility]  AIMS Total Score 0 0      GAD-7    Flowsheet Row Video Visit from 03/28/2023 in [facility] Office Visit from 01/26/2023 in [facility] Office Visit from 12/21/2022 in [facility] Office Visit from 08/24/2022 in [facility] Counselor from 06/02/2022 in [facility] [place]  Total GAD-7 Score 7 1 14 7 20       PHQ2-9    Flowsheet Row Video Visit from 05/11/2023 in [facility] Office Visit from 01/26/2023 in [facility] Counselor from 12/26/2022 in [facility] Office Visit from 12/21/2022 in [facility] Nutrition from 10/27/2022 in [facility]  Education Services at [facility] Total Score 0 0 5 5 0  PHQ-9 Total Score -- 3 18 20  --      Flowsheet Row Video Visit from 05/11/2023 in [facility] Video Visit from 03/28/2023 in [facility] Office Visit from 01/26/2023 in [facility]  C-SSRS RISK CATEGORY No Risk No Risk No Risk        Assessment and Plan: Morgan Fields is a 72 year old Caucasian female who has a history of PTSD, MDD, GAD was evaluated by telemedicine today.  Patient is currently stable on current medication regimen.   Plan PTSD-stable Prozac 20 mg p.o. daily Seroquel 25 mg p.o. nightly  GAD-improving Prozac 20 mg p.o. daily Continue CBT with Mr.Terry Montez Morita.  MDD in remission Seroquel 25 mg p.o. nightly Prozac as prescribed Continue CBT  Tobacco use disorder-improving Patient is currently cutting back.  Follow-up in clinic in 3 months or sooner if needed.   Collaboration of Care: Collaboration of Care: Referral or follow-up with counselor/therapist AEB patient encouraged to continue CBT.  Patient/Guardian was advised Release of Information must be obtained prior to any record release in order to collaborate their care with an outside provider. Patient/Guardian was advised if they have not already done so to contact the registration department to sign all necessary forms in order for [place] to release information regarding their care.   Consent: Patient/Guardian gives verbal consent for treatment and assignment of benefits for services provided during this visit. Patient/Guardian expressed understanding and agreed to proceed.   This note was generated in part or whole with voice recognition software. Voice recognition is usually quite accurate but there are transcription  errors that can and very often do occur. I apologize for any typographical errors that were not detected and  corrected.    Jomarie Longs, MD 05/12/2023, 8:29 AM

## 2023-06-01 ENCOUNTER — Ambulatory Visit (INDEPENDENT_AMBULATORY_CARE_PROVIDER_SITE_OTHER): Payer: Medicare Other | Admitting: Clinical

## 2023-06-01 DIAGNOSIS — F419 Anxiety disorder, unspecified: Secondary | ICD-10-CM | POA: Diagnosis not present

## 2023-06-01 DIAGNOSIS — F4310 Post-traumatic stress disorder, unspecified: Secondary | ICD-10-CM | POA: Diagnosis not present

## 2023-06-01 DIAGNOSIS — F331 Major depressive disorder, recurrent, moderate: Secondary | ICD-10-CM

## 2023-06-01 DIAGNOSIS — F17200 Nicotine dependence, unspecified, uncomplicated: Secondary | ICD-10-CM | POA: Diagnosis not present
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
## 2023-06-14 ENCOUNTER — Encounter: Payer: Self-pay | Admitting: Nutrition

## 2023-06-14 NOTE — Patient Instructions (Signed)
Keep trying to eat 3 meals and 2-3 snacks daily to meet calorie needs to gain weight Avoid foods that are triggers for your IBS Walk for relaxation if stress is triggering IBS Focus on more whole plant based foods as tolerated for nutrient needs Try to maintain weight and not lose anymore weight.

## 2023-07-13 ENCOUNTER — Ambulatory Visit (HOSPITAL_COMMUNITY): Payer: Medicare Other | Admitting: Clinical

## 2023-07-13 DIAGNOSIS — F17200 Nicotine dependence, unspecified, uncomplicated: Secondary | ICD-10-CM

## 2023-07-13 DIAGNOSIS — F419 Anxiety disorder, unspecified: Secondary | ICD-10-CM | POA: Diagnosis not present

## 2023-07-13 DIAGNOSIS — F331 Major depressive disorder, recurrent, moderate: Secondary | ICD-10-CM

## 2023-07-13 DIAGNOSIS — F4310 Post-traumatic stress disorder, unspecified: Secondary | ICD-10-CM | POA: Diagnosis not present

## 2023-07-13 NOTE — Progress Notes (Signed)
IN PERSON   I connected with Morgan Fields on 07/13/23 at  8:00 AM EDT in person and verified that I am speaking with the correct person using two identifiers.   Location: Patient: Office Provider: Office   I discussed the limitations of evaluation and management by telemedicine and the availability of in person appointments. The patient expressed understanding and agreed to proceed. ( IN PERSON)     THERAPIST PROGRESS NOTE   Session Time: 8:00 AM-9:00 AM   Participation Level: Active   Behavioral Response: CasualAlertDepressed   Type of Therapy: Individual Therapy   Treatment Goals addressed: Coping   Interventions: CBT, DBT, Solution Focused, Strength-based and Supportive   Summary: Morgan Fields is a 72 y.o. female who presents with PTSD as well as  Depression with Anxiety. The OPT therapist worked with the patient for her OPT treatment. The OPT therapist utilized Motivational Interviewing to assist in creating therapeutic repore.  The patient in the session was engaged and work in collaboration giving feedback about her stressors over the past few weeks.The patient  spoke about not putting herself on the back-burner as much and utilizing a time Financial trader (planner) which has helped her to section her day, journal, and balance work/leisure time. The patient spoke about her the connection from prior truama that created the inner messages that were dictating her behavior and placing pressure on her, as well as her realization and empowerment that she is in charge and does not have to perform each day based on trying to meet others expectations, but to meet her own. The patient spoke about the positive change and being able to communicate effectively with her partner and this helping to bring them closer together but also allowing her to let go of being so angry and resentful towards him. The OPT therapist utilized Cognitive Behavioral Therapy through cognitive  restructuring as well as worked with the patient on coping strategies to assist in management of mood including ongoing motivation and empowerment for the patient to be as active as possible with her current health limitations and to dedicate part of her time to her own leisure and self care. The patient spoke about being more active in challenging negative self thought and not looking outward for validation. The patient spoke about her ongoing willingness to work on identifying and challenging negative thoughts to reduce escalation of MH symptoms. The patient spoke about a ongoing commitment to self care in taking care and focus on her basic needs as a primary focus to improve her health. The patient spoke about taking advantage of the Fall season and getting out of the home more working in the yard. The patient has shown consistentcy in being able to meet her treatment plan goals and has been prepared leading up to session today for formal discharge. The patient and OPT therapist at this time agree for successful discharge.    Suicidal/Homicidal: Nowithout intent/plan   Therapist Response: The OPT therapist worked with the patient for the patients scheduled session. The patient was engaged in her session and gave feedback in relation to triggers, symptoms, and behavior responses over the past few weeks.The OPT therapist worked with the patient utilizing an in session Cognitive Behavioral Therapy exercise. The patient was responsive in the session and verbalized, " I feel like I have the tools and I am ready to try moving forward on my own and writing my next chapter". The OPT therapist worked with the patient on balancing her stressors with  leisure and utilizing her support network. Managing different sections of her life. The patient is currently focusing on her leisure time more consistently to help her manage her overall general stress.   The patient spoke about being able to consistently challenge negative  self thought and not longer base her actions and behavior on seeking approval from others and for validation of her own worth. The patient has shown consistency with her trend over the last few sessions and has been preparing for Discharge. The OPT therapist and patient agree with her consistency in being able to meet her treatment goal for formal successful Discharge at this time..     Plan: Successful Discharge    Diagnosis:      Axis I:  PTSD/Major depressive disorder, recurrent episode, moderate with anxious distress /Tobacco Use                           Axis II: No diagnosis     Collaboration of Care: Overview of patient involvement in the med management program with Dr. Elna Breslow.   Patient/Guardian was advised Release of Information must be obtained prior to any record release in order to collaborate their care with an outside provider. Patient/Guardian was advised if they have not already done so to contact the registration department to sign all necessary forms in order for [place] to release information regarding their care.    Consent: Patient/Guardian gives verbal consent for treatment and assignment of benefits for services provided during this visit. Patient/Guardian expressed understanding and agreed to proceed.    I discussed the assessment and treatment plan with the patient. The patient was provided an opportunity to ask questions and all were answered. The patient agreed with the plan and demonstrated an understanding of the instructions.   The patient was advised to call back or seek an in-person evaluation if the symptoms worsen or if the condition fails to improve as anticipated.   I provided 60 minutes of face-to-face time during this encounter.   Winfred Burn, LCSW   07/13/2023

## 2023-07-19 NOTE — Telephone Encounter (Signed)
Called patient to give her the fax number for the office

## 2023-07-28 ENCOUNTER — Other Ambulatory Visit (INDEPENDENT_AMBULATORY_CARE_PROVIDER_SITE_OTHER): Payer: Self-pay | Admitting: Gastroenterology

## 2023-07-28 NOTE — Telephone Encounter (Signed)
Last seen 03/21/23

## 2023-08-03 ENCOUNTER — Other Ambulatory Visit: Payer: Self-pay | Admitting: Psychiatry

## 2023-08-03 DIAGNOSIS — F4310 Post-traumatic stress disorder, unspecified: Secondary | ICD-10-CM

## 2023-08-03 DIAGNOSIS — F411 Generalized anxiety disorder: Secondary | ICD-10-CM

## 2023-08-03 DIAGNOSIS — F3342 Major depressive disorder, recurrent, in full remission: Secondary | ICD-10-CM

## 2023-08-03 NOTE — Telephone Encounter (Signed)
received fax requesting a refill on the fluoxetine, prazosin,, quetiapine pt was last seen on 8-22 next appt 11-21

## 2023-08-10 ENCOUNTER — Encounter: Payer: Self-pay | Admitting: Psychiatry

## 2023-08-10 ENCOUNTER — Telehealth: Payer: Medicare Other | Admitting: Psychiatry

## 2023-08-10 DIAGNOSIS — F17200 Nicotine dependence, unspecified, uncomplicated: Secondary | ICD-10-CM

## 2023-08-10 DIAGNOSIS — F4310 Post-traumatic stress disorder, unspecified: Secondary | ICD-10-CM

## 2023-08-10 DIAGNOSIS — F3342 Major depressive disorder, recurrent, in full remission: Secondary | ICD-10-CM

## 2023-08-10 DIAGNOSIS — Z79899 Other long term (current) drug therapy: Secondary | ICD-10-CM

## 2023-08-10 DIAGNOSIS — F17210 Nicotine dependence, cigarettes, uncomplicated: Secondary | ICD-10-CM | POA: Diagnosis not present

## 2023-08-10 DIAGNOSIS — Z634 Disappearance and death of family member: Secondary | ICD-10-CM | POA: Insufficient documentation

## 2023-08-10 DIAGNOSIS — F411 Generalized anxiety disorder: Secondary | ICD-10-CM

## 2023-08-10 MED ORDER — FLUOXETINE HCL 10 MG PO CAPS: 30.0000 mg | ORAL_CAPSULE | ORAL | 0 refills | Status: DC

## 2023-08-10 NOTE — Progress Notes (Signed)
Virtual Visit via Video Note  I connected with Morgan Fields on 08/10/23 at  1:00 PM EST by a video enabled telemedicine application and verified that I am speaking with the correct person using two identifiers.  Location Provider Location : [facility] Patient Location : Home  Participants: Patient , Provider    I discussed the limitations of evaluation and management by telemedicine and the availability of in person appointments. The patient expressed understanding and agreed to proceed.   I discussed the assessment and treatment plan with the patient. The patient was provided an opportunity to ask questions and all were answered. The patient agreed with the plan and demonstrated an understanding of the instructions.   The patient was advised to call back or seek an in-person evaluation if the symptoms worsen or if the condition fails to improve as anticipated.   BH MD OP Progress Note  08/11/2023 8:10 AM Morgan Fields  MRN:  [id number]  Chief Complaint:  Chief Complaint  Patient presents with   Follow-up   Depression   Anxiety   Medication Refill   HPI: Morgan Fields is a 72 year old Caucasian female married, retired, lives in [place], has a history of PTSD, GAD, MDD, tobacco use disorder, arthritis, osteoporosis was evaluated by telemedicine today.  Patient today reports she has had a few recent psychosocial stressors.  She reports her 22 year old dog had to be put down recently.  That has been extremely stressful.  She is currently grieving.  She reports her husband is currently going through health issues that also adds on to her stressors.  She hence has been having episodes of grieving the death of her pet, racing thoughts, rumination, worrying about things.  Patient reports sleep is overall good.  She continues to use prazosin which are beneficial for her nightmares.  She reports appetite is fair.  She used to follow up with Mr. Suzan Garibaldi however reports  she was released from therapy since she was doing fairly well.  She agrees to reach out to her therapist again to reestablish care.  Patient is currently compliant on the fluoxetine 20 mg which she takes in divided dosage.  Patient is interested in dosage increase.  She is also compliant on the Seroquel, denies side effects.  Patient reports she had labs done at her primary care provider's office and her sodium level came back at 143 which was within normal limits.  Patient agreeable to repeating sodium as well as platelet count since we are increasing the Prozac dosage.  Patient currently denies any suicidality, homicidality or perceptual disturbances.  Patient denies any other concerns today.    Visit Diagnosis:    ICD-10-CM   1. PTSD (post-traumatic stress disorder)  F43.10 FLUoxetine (PROZAC) 10 MG capsule    2. GAD (generalized anxiety disorder)  F41.1 FLUoxetine (PROZAC) 10 MG capsule    3. MDD (major depressive disorder), recurrent, in full remission (HCC)  F33.42 FLUoxetine (PROZAC) 10 MG capsule    4. Tobacco use disorder  F17.200     5. High risk medication use  Z79.899 Sodium    Platelet count      Past Psychiatric History: I have reviewed past psychiatric history from progress note on 02/15/2022.  Past trials of Lexapro, hydroxyzine, Cymbalta.  Past Medical History:  Past Medical History:  Diagnosis Date   Complication of anesthesia    takes a long time to wake up from anesthesia   Depression    Hiatal hernia    HLD (  hyperlipidemia) 07/22/2019   IBS (irritable bowel syndrome)    Osteoporosis 07/22/2019   PMB (postmenopausal bleeding) 03/11/2016   Post-menopause on HRT (hormone replacement therapy) 03/11/2016   Skin cancer    Recent to anterior chest; cervical cancer   Thickened endometrium 03/16/2016   Vaginal Pap smear, abnormal    Vitamin D deficiency disease 07/22/2019    Past Surgical History:  Procedure Laterality Date   BIOPSY  11/08/2022   Procedure:  BIOPSY;  Surgeon: Dolores Frame, MD;  [facility];  Service: Gastroenterology;;   BIOPSY  12/14/2022   Procedure: BIOPSY;  Surgeon: Dolores Frame, MD;  [facility];  Service: Gastroenterology;;   BREAST ENHANCEMENT SURGERY     CERVICAL BIOPSY     precancerous years ago   CHOLECYSTECTOMY  2011   COLONOSCOPY N/A 05/08/2013   Procedure: COLONOSCOPY;  Surgeon: Malissa Hippo, MD;  [facility];  Service: Endoscopy;  Laterality: N/A;  200   COLONOSCOPY WITH PROPOFOL N/A 12/14/2022   Procedure: COLONOSCOPY WITH PROPOFOL;  Surgeon: Dolores Frame, MD;  [facility];  Service: Gastroenterology;  Laterality: N/A;  9:30am;ASA 2   ESOPHAGOGASTRODUODENOSCOPY (EGD) WITH PROPOFOL N/A 11/08/2022   Procedure: ESOPHAGOGASTRODUODENOSCOPY (EGD) WITH PROPOFOL;  Surgeon: Dolores Frame, MD;  [facility];  Service: Gastroenterology;  Laterality: N/A;  10:15 AM   HYSTEROSCOPY WITH D & C N/A 10/05/2016   Procedure: HYSTEROSCOPY; UTERINE CURETTAGE;  Surgeon: Lazaro Arms, MD;  Location: [facility];  Service: Gynecology;  Laterality: N/A;   POLYPECTOMY N/A 10/05/2016   Procedure: REMOVAL OF ENDOMETRIAL POLYP;  Surgeon: Lazaro Arms, MD;  Location: [facility];  Service: Gynecology;  Laterality: N/A;   TUBAL LIGATION      Family Psychiatric History: I have reviewed family psychiatric history from progress note on 02/15/2022.  Family History:  Family History  Problem Relation Age of Onset   Dementia Mother    Hyperlipidemia Mother    Thyroid disease Mother    Hypertension Mother    Schizophrenia Father    Alcohol abuse Father        Schizophrenia   Depression Brother    Mental illness Brother        Schizophrenia (2 brothers); schizophrenia and bipolar (1 brother)   Heart attack Brother    Schizophrenia Brother    Kidney failure Brother    Bipolar disorder Brother    Alcohol abuse Maternal Uncle    Alcohol abuse  Paternal Uncle    Alzheimer's disease Maternal Grandfather    Cancer Maternal Grandmother        colon   Diabetes Maternal Grandmother    Heart attack Paternal Grandfather    Heart disease Paternal Grandfather    Diabetes Paternal Grandfather    Mental illness Paternal Grandmother    Polycystic ovary syndrome Daughter    Other Daughter        knee replacement   Thyroid disease Daughter        had radiation on thyroid    Social History: I have reviewed social history from progress note on 02/15/2022. Social History   Socioeconomic History   Marital status: Married    Spouse name: Not on file   Number of children: Not on file   Years of education: Not on file   Highest education level: Not on file  Occupational History   Not on file  Tobacco Use   Smoking status: Every Day    Current  packs/day: 0.25    Average packs/day: 0.3 packs/day for 37.0 years (9.3 ttl pk-yrs)    Types: Cigarettes    Passive exposure: Current   Smokeless tobacco: Never   Tobacco comments:    smokes 5 cig daily- cutting back  Vaping Use   Vaping status: Never Used  Substance and Sexual Activity   Alcohol use: Not Currently    Comment: Quit September 2019   Drug use: No   Sexual activity: Yes    Birth control/protection: Post-menopausal  Other Topics Concern   Not on file  Social History Narrative   Married for 49 years.Retired,used to work at [employer].   Social Determinants of Health   Financial Resource Strain: Low Risk  (01/09/2020)   Overall Financial Resource Strain (CARDIA)    Difficulty of Paying Living Expenses: Not hard at all  Food Insecurity: No Food Insecurity (01/09/2020)   Hunger Vital Sign    Worried About Running Out of Food in the Last Year: Never true    Ran Out of Food in the Last Year: Never true  Transportation Needs: No Transportation Needs (01/09/2020)   PRAPARE - Administrator, Civil Service (Medical): No    Lack of Transportation  (Non-Medical): No  Physical Activity: Sufficiently Active (01/09/2020)   Exercise Vital Sign    Days of Exercise per Week: 5 days    Minutes of Exercise per Session: 30 min  Stress: No Stress Concern Present (01/09/2020)   [employer] of Occupational Health - Occupational Stress Questionnaire    Feeling of Stress : Only a little  Social Connections: Socially Integrated (01/09/2020)   Social Connection and Isolation Panel [NHANES]    Frequency of Communication with Friends and Family: Once a week    Frequency of Social Gatherings with Friends and Family: More than three times a week    Attends Religious Services: More than 4 times per year    Active Member of Clubs or Organizations: Yes    Attends Engineer, structural: More than 4 times per year    Marital Status: Married    Allergies: No Known Allergies  Metabolic Disorder Labs: No results found for: "HGBA1C", "MPG" No results found for: "PROLACTIN" Lab Results  Component Value Date   CHOL 239 (H) 01/21/2021   TRIG 86 01/21/2021   HDL 65 01/21/2021   CHOLHDL 3.7 01/21/2021   LDLCALC 155 (H) 01/21/2021   LDLCALC 134 (H) 05/28/2020   Lab Results  Component Value Date   TSH 1.070 03/07/2022    Therapeutic Level Labs: No results found for: "LITHIUM" No results found for: "VALPROATE" No results found for: "CBMZ"  Current Medications: Current Outpatient Medications  Medication Sig Dispense Refill   methocarbamol (ROBAXIN) 500 MG tablet Take 500 mg by mouth 2 (two) times daily.     propranolol (INDERAL) 10 MG tablet Take 10 mg by mouth 2 (two) times daily as needed.     colestipol (COLESTID) 1 g tablet TAKE 2 TABLETS(2 GRAMS) BY MOUTH DAILY 60 tablet 2   ezetimibe (ZETIA) 10 MG tablet Take 10 mg by mouth daily.     FLUoxetine (PROZAC) 10 MG capsule Take 3 capsules (30 mg total) by mouth as directed. Take 2 tablets daily AM and 1 tablet PM 270 capsule 0   prazosin (MINIPRESS) 1 MG capsule TAKE 1 CAPSULE AT  BEDTIME 90 capsule 3   QUEtiapine (SEROQUEL) 25 MG tablet TAKE 1 TABLET AT BEDTIME 90 tablet 3   No current  facility-administered medications for this visit.     Musculoskeletal: Strength & Muscle Tone:  UTA Gait & Station: normal Patient leans: N/A  Psychiatric Specialty Exam: Review of Systems  Psychiatric/Behavioral:  The patient is nervous/anxious.        Grief    There were no vitals taken for this visit.There is no height or weight on file to calculate BMI.  General Appearance: Casual  Eye Contact:  Fair  Speech:  Clear and Coherent  Volume:  Normal  Mood:  Anxious,grief  Affect:  Congruent  Thought Process:  Goal Directed and Descriptions of Associations: Intact  Orientation:  Full (Time, Place, and Person)  Thought Content: Logical   Suicidal Thoughts:  No  Homicidal Thoughts:  No  Memory:  Immediate;   Fair Recent;   Fair Remote;   Fair  Judgement:  Fair  Insight:  Fair  Psychomotor Activity:  Normal  Concentration:  Concentration: Fair and Attention Span: Fair  Recall:  [employer] of Knowledge: Fair  Language: Fair  Akathisia:  No  Handed:  Right  AIMS (if indicated): not done  Assets:  Desire for Improvement Social Support Talents/Skills Transportation  ADL's:  Intact  Cognition: WNL  Sleep:  Fair   Screenings: Midwife Visit from 01/26/2023 in [facility] Office Visit from 05/11/2022 in [facility]  AIMS Total Score 0 0      GAD-7    Flowsheet Row Video Visit from 03/28/2023 in [facility] Office Visit from 01/26/2023 in [facility] Office Visit from 12/21/2022 in [facility] Office Visit from 08/24/2022 in [facility] Counselor from 06/02/2022 in [facility] at  [place]  Total GAD-7 Score 7 1 14 7 20       PHQ2-9    Flowsheet Row Video Visit from 05/11/2023 in [facility] Office Visit from 01/26/2023 in [facility] Counselor from 12/26/2022 in [facility] Office Visit from 12/21/2022 in [facility] Nutrition from 10/27/2022 in [facility] at [facility] Total Score 0 0 5 5 0  PHQ-9 Total Score -- 3 18 20  --      Flowsheet Row Video Visit from 08/10/2023 in [facility] Video Visit from 05/11/2023 in [facility] Video Visit from 03/28/2023 in [facility]  C-SSRS RISK CATEGORY No Risk No Risk No Risk        Assessment and Plan: Morgan Fields is a 72 year old Caucasian female who has a history of PTSD, MDD, GAD was evaluated by telemedicine today.  Patient is currently grieving the loss of her pet as well as does have anxiety due to situational stressors, will benefit from reestablishing care with therapist as well as dosage readjustment of her fluoxetine, plan as noted below.  Plan PTSD-stable Prozac as prescribed Seroquel 25 mg p.o. nightly  GAD-unstable Increase Prozac to 30 mg p.o. daily in divided dosage Continue CBT with Mr. Suzan Garibaldi patient to reestablish care.  MDD information Seroquel 25 mg p.o. nightly Prozac as prescribed Continue CBT  Tobacco use disorder-improving Will monitor closely.  High risk medication use-we will order sodium level, platelet count-patient agrees to get it done.  Follow-up in clinic in 4 weeks or sooner if needed.  Collaboration of Care: Collaboration of Care: Referral or follow-up with counselor/therapist AEB patient encouraged to establish care with therapist.  Patient/Guardian was  advised Release of Information must be obtained prior to any record release in order to collaborate their care with an outside provider. Patient/Guardian was advised if they have not already done so to contact the registration department to sign all necessary forms in order for [place] to release information regarding their care.   Consent: Patient/Guardian gives verbal consent for treatment and assignment of benefits for services provided during this visit. Patient/Guardian expressed understanding and agreed to proceed.   This note was generated in part or whole with voice recognition software. Voice recognition is usually quite accurate but there are transcription errors that can and very often do occur. I apologize for any typographical errors that were not detected and corrected.    Jomarie Longs, MD 08/11/2023, 8:10 AM

## 2023-08-30 ENCOUNTER — Encounter (INDEPENDENT_AMBULATORY_CARE_PROVIDER_SITE_OTHER): Payer: Self-pay | Admitting: Gastroenterology

## 2023-09-18 ENCOUNTER — Telehealth: Payer: Self-pay | Admitting: Psychiatry

## 2023-09-18 DIAGNOSIS — Z79899 Other long term (current) drug therapy: Secondary | ICD-10-CM

## 2023-09-18 NOTE — Telephone Encounter (Signed)
I have printed out lab slip for this patient will have staff fax it to [facility] of choice.

## 2023-09-18 NOTE — Telephone Encounter (Signed)
Lab order sent to [facility] via fax [phone] confirmation received

## 2023-09-18 NOTE — Telephone Encounter (Signed)
Patient called stating she went to [facility] in [place] and there was no order for her labs. They provided a fax number [phone], to send the lab slip to. Please advise.

## 2023-09-21 ENCOUNTER — Ambulatory Visit (INDEPENDENT_AMBULATORY_CARE_PROVIDER_SITE_OTHER): Payer: Medicare Other | Admitting: Gastroenterology

## 2023-09-22 ENCOUNTER — Ambulatory Visit (INDEPENDENT_AMBULATORY_CARE_PROVIDER_SITE_OTHER): Payer: Medicare Other | Admitting: Gastroenterology

## 2023-09-22 ENCOUNTER — Encounter (INDEPENDENT_AMBULATORY_CARE_PROVIDER_SITE_OTHER): Payer: Self-pay | Admitting: Gastroenterology

## 2023-09-22 VITALS — BP 113/75 | HR 82 | Temp 97.9°F | Ht 62.0 in | Wt 114.9 lb

## 2023-09-22 DIAGNOSIS — K529 Noninfective gastroenteritis and colitis, unspecified: Secondary | ICD-10-CM

## 2023-09-22 DIAGNOSIS — K589 Irritable bowel syndrome without diarrhea: Secondary | ICD-10-CM | POA: Insufficient documentation

## 2023-09-22 DIAGNOSIS — K580 Irritable bowel syndrome with diarrhea: Secondary | ICD-10-CM

## 2023-09-22 NOTE — Patient Instructions (Signed)
 Can take Imodium as needed for diarrhea if more than 3 bowel movements per day Proceed with SIBO breath test

## 2023-09-22 NOTE — Progress Notes (Signed)
 Toribio Fortune, M.D. Gastroenterology & Hepatology [facility]enter[facility] [address] [place], [place] [address]  Primary Care Physician: Benjamin Raina Elizabeth, NP [address] [place] [address]  I will communicate my assessment and recommendations to the referring MD via EMR.  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 female with past medical history of depression, HLD, osteoporosis, IBS-D, vitamin d  deficiency, coming for evaluation of chronic diarrhea.  The patient was last seen on 03/21/2023. At that time, the patient was advised to monitor her bowel movements as she was taking multiple medications for mental health.  Patient took dicyclomine  and felt very depressed with it . She stopped this medication as advised by [facility].   She then started taking colestipol  2 g per day, which led to improvement of her diarrhea down to 4-5 Bms. However, she states that she was feeling lightheaded and dehydated.  She is not currently taking any antidiarrheals.  She is currently having 4-6 Bms per day.  States this is the best she has been as she has had previous episode of severe persistent diarrhea in the past.  States stools have decreased in severity. She has not lost more weight - she is weighing 114 lb. She is not currently taking Imodium or anything for diarrhea.  She is not having any nighttime episodes of diarrhea at the moment.  Patient is currently on Prozac  and Seroquel .  Patient has previously tried colestipol  and Questran .  Did not tolerate these medications.  States having pain in her upper abdomen when she takes Zetia.  The patient denies having any nausea, vomiting, fever, chills, hematochezia, melena, hematemesis, jaundice, pruritus or weight loss.  CT A/P with contrast: 02/03/23: unremarkable Last Colonoscopy: -11/2022 The examined portion of the  ileum was normal.                           - Subtle mucosal nodularity in the cecum. Injected.                            Biopsied, normal tissue.                           - The entire examined colon is normal. Biopsied-normal                            - Non-bleeding internal hemorrhoids. Last Endoscopy:11/08/2022 - 3 cm hiatal hernia.                           - Erosive gastropathy with no stigmata of recent                            bleeding. Biopsied-normal biopsies                            - Normal examined duodenum.  Past Medical History: Past Medical History:  Diagnosis Date   Complication of anesthesia    takes a long time to wake up from anesthesia   Depression    Hiatal hernia    HLD (hyperlipidemia) 07/22/2019   IBS (irritable bowel syndrome)  Osteoporosis 07/22/2019   PMB (postmenopausal bleeding) 03/11/2016   Post-menopause on HRT (hormone replacement therapy) 03/11/2016   Skin cancer    Recent to anterior chest; cervical cancer   Thickened endometrium 03/16/2016   Vaginal Pap smear, abnormal    Vitamin D  deficiency disease 07/22/2019    Past Surgical History: Past Surgical History:  Procedure Laterality Date   BIOPSY  11/08/2022   Procedure: BIOPSY;  Surgeon: Eartha Angelia Sieving, MD;  [facility];  Service: Gastroenterology;;   BIOPSY  12/14/2022   Procedure: BIOPSY;  Surgeon: Eartha Angelia Sieving, MD;  [facility];  Service: Gastroenterology;;   BREAST ENHANCEMENT SURGERY     CERVICAL BIOPSY     precancerous years ago   CHOLECYSTECTOMY  2011   COLONOSCOPY N/A 05/08/2013   Procedure: COLONOSCOPY;  Surgeon: Claudis RAYMOND Rivet, MD;  [facility]: [facility];  Service: Endoscopy;  Laterality: N/A;  200   COLONOSCOPY WITH PROPOFOL  N/A 12/14/2022   Procedure: COLONOSCOPY WITH PROPOFOL ;  Surgeon: Eartha Angelia Sieving, MD;  [facility];  Service: Gastroenterology;  Laterality: N/A;  9:30am;ASA 2    ESOPHAGOGASTRODUODENOSCOPY (EGD) WITH PROPOFOL  N/A 11/08/2022   Procedure: ESOPHAGOGASTRODUODENOSCOPY (EGD) WITH PROPOFOL ;  Surgeon: Eartha Angelia Sieving, MD;  [facility];  Service: Gastroenterology;  Laterality: N/A;  10:15 AM   HYSTEROSCOPY WITH D & C N/A 10/05/2016   Procedure: HYSTEROSCOPY; UTERINE CURETTAGE;  Surgeon: Vonn VEAR Inch, MD;  Location: [facility];  Service: Gynecology;  Laterality: N/A;   POLYPECTOMY N/A 10/05/2016   Procedure: REMOVAL OF ENDOMETRIAL POLYP;  Surgeon: Vonn VEAR Inch, MD;  Location: [facility];  Service: Gynecology;  Laterality: N/A;   TUBAL LIGATION      Family History: Family History  Problem Relation Age of Onset   Dementia Mother    Hyperlipidemia Mother    Thyroid  disease Mother    Hypertension Mother    Schizophrenia Father    Alcohol abuse Father        Schizophrenia   Depression Brother    Mental illness Brother        Schizophrenia (2 brothers); schizophrenia and bipolar (1 brother)   Heart attack Brother    Schizophrenia Brother    Kidney failure Brother    Bipolar disorder Brother    Alcohol abuse Maternal Uncle    Alcohol abuse Paternal Uncle    Alzheimer's disease Maternal Grandfather    Cancer Maternal Grandmother        colon   Diabetes Maternal Grandmother    Heart attack Paternal Grandfather    Heart disease Paternal Grandfather    Diabetes Paternal Grandfather    Mental illness Paternal Grandmother    Polycystic ovary syndrome Daughter    Other Daughter        knee replacement   Thyroid  disease Daughter        had radiation on thyroid     Social History: Social History   Tobacco Use  Smoking Status Every Day   Current packs/day: 0.25   Average packs/day: 0.3 packs/day for 37.0 years (9.3 ttl pk-yrs)   Types: Cigarettes   Passive exposure: Current  Smokeless Tobacco Never  Tobacco Comments   smokes 5 cig daily- cutting back   Social History   Substance and Sexual Activity  Alcohol Use Not Currently    Comment: Quit September 2019   Social History   Substance and Sexual Activity  Drug Use No    Allergies: No Known Allergies  Medications: Current Outpatient  Medications  Medication Sig Dispense Refill   ezetimibe (ZETIA) 10 MG tablet Take 10 mg by mouth. One three times per week     famotidine  (PEPCID ) 20 MG tablet Take 20 mg by mouth. Takes as needed     FLUoxetine  (PROZAC ) 10 MG capsule Take 3 capsules (30 mg total) by mouth as directed. Take 2 tablets daily AM and 1 tablet PM 270 capsule 0   methocarbamol (ROBAXIN) 500 MG tablet Take 500 mg by mouth 2 (two) times daily.     prazosin  (MINIPRESS ) 1 MG capsule TAKE 1 CAPSULE AT BEDTIME 90 capsule 3   propranolol  (INDERAL ) 10 MG tablet Take 10 mg by mouth 2 (two) times daily as needed.     QUEtiapine  (SEROQUEL ) 25 MG tablet TAKE 1 TABLET AT BEDTIME 90 tablet 3   No current facility-administered medications for this visit.    Review of Systems: GENERAL: negative for malaise, night sweats HEENT: No changes in hearing or vision, no nose bleeds or other nasal problems. NECK: Negative for lumps, goiter, pain and significant neck swelling RESPIRATORY: Negative for cough, wheezing CARDIOVASCULAR: Negative for chest pain, leg swelling, palpitations, orthopnea GI: SEE HPI MUSCULOSKELETAL: Negative for joint pain or swelling, back pain, and muscle pain. SKIN: Negative for lesions, rash PSYCH: Negative for sleep disturbance, mood disorder and recent psychosocial stressors. HEMATOLOGY Negative for prolonged bleeding, bruising easily, and swollen nodes. ENDOCRINE: Negative for cold or heat intolerance, polyuria, polydipsia and goiter. NEURO: negative for tremor, gait imbalance, syncope and seizures. The remainder of the review of systems is noncontributory.   Physical Exam: BP 113/75   Pulse 82   Temp 97.9 F (36.6 C) (Oral)   Ht 5' 2 (1.575 m)   Wt 114 lb 14.4 oz (52.1 kg)   BMI 21.02 kg/m  GENERAL: The patient is AO x3, in no  acute distress. HEENT: Head is normocephalic and atraumatic. EOMI are intact. Mouth is well hydrated and without lesions. NECK: Supple. No masses LUNGS: Clear to auscultation. No presence of rhonchi/wheezing/rales. Adequate chest expansion HEART: RRR, normal s1 and s2. ABDOMEN: tender in epigastric area, no guarding, no peritoneal signs, and nondistended. BS +. No masses. EXTREMITIES: Without any cyanosis, clubbing, rash, lesions or edema. NEUROLOGIC: AOx3, no focal motor deficit. SKIN: no jaundice, no rashes  Imaging/Labs: as above  I personally reviewed and interpreted the available labs, imaging and endoscopic files.  Impression and Plan: JANNETTE COTHAM is a 73 y.o. female with past medical history of depression, HLD, osteoporosis, IBS-D, vitamin d  deficiency, coming for evaluation of chronic diarrhea.  Patient has presented multiple investigations to evaluate the etiology of her diarrhea, with negative testing for infectious disease, pancreatic etiologies, celiac disease, or microscopic colitis, among others.  As she has presented some bloating, we will rule out SIBO with breath testing.  Her bowel movements have returned to her baseline and she has not lost more weight..  For now, we will treat her diarrhea with Imodium as needed as she was not able to tolerate bowel salt binders.  It is possible that part of her diarrhea is related to her psychiatric medications.  -Can take Imodium as needed for diarrhea if more than 3 bowel movements per day -Proceed with SIBO breath test  All questions were answered.      Toribio Fortune, MD Gastroenterology and Hepatology [facility] Gastroenterology

## 2023-10-03 ENCOUNTER — Telehealth: Payer: Medicare Other | Admitting: Psychiatry

## 2023-10-20 ENCOUNTER — Telehealth (INDEPENDENT_AMBULATORY_CARE_PROVIDER_SITE_OTHER): Payer: Medicare Other | Admitting: Psychiatry

## 2023-10-20 ENCOUNTER — Encounter: Payer: Self-pay | Admitting: Psychiatry

## 2023-10-20 DIAGNOSIS — F411 Generalized anxiety disorder: Secondary | ICD-10-CM | POA: Diagnosis not present

## 2023-10-20 DIAGNOSIS — F4310 Post-traumatic stress disorder, unspecified: Secondary | ICD-10-CM

## 2023-10-20 DIAGNOSIS — F17210 Nicotine dependence, cigarettes, uncomplicated: Secondary | ICD-10-CM

## 2023-10-20 DIAGNOSIS — F330 Major depressive disorder, recurrent, mild: Secondary | ICD-10-CM | POA: Diagnosis not present

## 2023-10-20 DIAGNOSIS — F17200 Nicotine dependence, unspecified, uncomplicated: Secondary | ICD-10-CM

## 2023-10-20 MED ORDER — FLUOXETINE HCL 10 MG PO CAPS: 30.0000 mg | ORAL_CAPSULE | ORAL | 1 refills | Status: DC

## 2023-10-20 NOTE — Progress Notes (Signed)
Virtual Visit via Video Note  I connected with Morgan Fields on 10/20/23 at  9:00 AM EST by a video enabled telemedicine application and verified that I am speaking with the correct person using two identifiers.  Location Provider Location : [facility] Patient Location : Home  Participants: Patient , Provider    I discussed the limitations of evaluation and management by telemedicine and the availability of in person appointments. The patient expressed understanding and agreed to proceed.   I discussed the assessment and treatment plan with the patient. The patient was provided an opportunity to ask questions and all were answered. The patient agreed with the plan and demonstrated an understanding of the instructions.   The patient was advised to call back or seek an in-person evaluation if the symptoms worsen or if the condition fails to improve as anticipated.    [facility] MD OP Progress Note  10/20/2023 1:37 PM Morgan Fields  MRN:  [id number]  Chief Complaint:  Chief Complaint  Patient presents with   Anxiety   Follow-up   Depression   Medication Refill   HPI: Morgan Fields is a 73 year old Caucasian female, married, retired, lives in [place], has a history of PTSD, GAD, MDD, tobacco use disorder, arthritis, osteoporosis was evaluated by telemedicine today.  She has been experiencing an exacerbation of her mental health symptoms, including increased irritability and a sense of losing control, which she attributes to her current illness.  She has been struggling with flulike symptoms.  Prior to this, she was stable on her medication regimen, which includes Prozac, Seroquel, and prazosin.   For about a week and a half, she has had flu-like symptoms, initially thought to be allergies. Both she and her husband tested negative for COVID-19. Her symptoms began with a cold-like feeling and progressed to severe fatigue, difficulty breathing, and a persistent cough. Her husband  experienced severe vertigo at the onset.  She continues to take her prescribed medications, including Prozac, Seroquel, and prazosin.  She and her husband recently adopted two dogs, which has been a positive aspect of their lives, providing motivation and companionship. Despite the illness, she is trying to maintain a routine and care for the dogs.  Patient currently denies any suicidality, homicidality or perceptual disturbances.  She is currently not in psychotherapy since she felt she does not need it at this time.  Visit Diagnosis:    ICD-10-CM   1. PTSD (post-traumatic stress disorder)  F43.10 FLUoxetine (PROZAC) 10 MG capsule    2. GAD (generalized anxiety disorder)  F41.1 FLUoxetine (PROZAC) 10 MG capsule    3. MDD (major depressive disorder), recurrent episode, mild (HCC)  F33.0 FLUoxetine (PROZAC) 10 MG capsule    4. Tobacco use disorder  F17.200       Past Psychiatric History: I have reviewed past psychiatric history from progress note on 02/15/2022.  Past trials of Lexapro, hydroxyzine, Cymbalta  Past Medical History:  Past Medical History:  Diagnosis Date   Complication of anesthesia    takes a long time to wake up from anesthesia   Depression    Hiatal hernia    HLD (hyperlipidemia) 07/22/2019   IBS (irritable bowel syndrome)    Osteoporosis 07/22/2019   PMB (postmenopausal bleeding) 03/11/2016   Post-menopause on HRT (hormone replacement therapy) 03/11/2016   Skin cancer    Recent to anterior chest; cervical cancer   Thickened endometrium 03/16/2016   Vaginal Pap smear, abnormal    Vitamin D deficiency disease 07/22/2019  Past Surgical History:  Procedure Laterality Date   BIOPSY  11/08/2022   Procedure: BIOPSY;  Surgeon: Dolores Frame, MD;  [facility];  Service: Gastroenterology;;   BIOPSY  12/14/2022   Procedure: BIOPSY;  Surgeon: Dolores Frame, MD;  [facility];  Service: Gastroenterology;;   BREAST  ENHANCEMENT SURGERY     CERVICAL BIOPSY     precancerous years ago   CHOLECYSTECTOMY  2011   COLONOSCOPY N/A 05/08/2013   Procedure: COLONOSCOPY;  Surgeon: Malissa Hippo, MD;  [facility];  Service: Endoscopy;  Laterality: N/A;  200   COLONOSCOPY WITH PROPOFOL N/A 12/14/2022   Procedure: COLONOSCOPY WITH PROPOFOL;  Surgeon: Dolores Frame, MD;  [facility];  Service: Gastroenterology;  Laterality: N/A;  9:30am;ASA 2   ESOPHAGOGASTRODUODENOSCOPY (EGD) WITH PROPOFOL N/A 11/08/2022   Procedure: ESOPHAGOGASTRODUODENOSCOPY (EGD) WITH PROPOFOL;  Surgeon: Dolores Frame, MD;  [facility];  Service: Gastroenterology;  Laterality: N/A;  10:15 AM   HYSTEROSCOPY WITH D & C N/A 10/05/2016   Procedure: HYSTEROSCOPY; UTERINE CURETTAGE;  Surgeon: Lazaro Arms, MD;  Location: [facility];  Service: Gynecology;  Laterality: N/A;   POLYPECTOMY N/A 10/05/2016   Procedure: REMOVAL OF ENDOMETRIAL POLYP;  Surgeon: Lazaro Arms, MD;  Location: [facility];  Service: Gynecology;  Laterality: N/A;   TUBAL LIGATION      Family Psychiatric History: I have reviewed family psychiatric history from progress note on 02/15/2022.  Family History:  Family History  Problem Relation Age of Onset   Dementia Mother    Hyperlipidemia Mother    Thyroid disease Mother    Hypertension Mother    Schizophrenia Father    Alcohol abuse Father        Schizophrenia   Depression Brother    Mental illness Brother        Schizophrenia (2 brothers); schizophrenia and bipolar (1 brother)   Heart attack Brother    Schizophrenia Brother    Kidney failure Brother    Bipolar disorder Brother    Alcohol abuse Maternal Uncle    Alcohol abuse Paternal Uncle    Alzheimer's disease Maternal Grandfather    Cancer Maternal Grandmother        colon   Diabetes Maternal Grandmother    Heart attack Paternal Grandfather    Heart disease Paternal Grandfather    Diabetes Paternal Grandfather     Mental illness Paternal Grandmother    Polycystic ovary syndrome Daughter    Other Daughter        knee replacement   Thyroid disease Daughter        had radiation on thyroid    Social History: I have reviewed social history from progress note on 02/15/2022. Social History   Socioeconomic History   Marital status: Married    Spouse name: Not on file   Number of children: Not on file   Years of education: Not on file   Highest education level: Not on file  Occupational History   Not on file  Tobacco Use   Smoking status: Every Day    Current packs/day: 0.25    Average packs/day: 0.3 packs/day for 37.0 years (9.3 ttl pk-yrs)    Types: Cigarettes    Passive exposure: Current   Smokeless tobacco: Never   Tobacco comments:    smokes 5 cig daily- cutting back  Vaping Use   Vaping status: Never Used  Substance and Sexual Activity   Alcohol use: Not  Currently    Comment: Quit September 2019   Drug use: No   Sexual activity: Yes    Birth control/protection: Post-menopausal  Other Topics Concern   Not on file  Social History Narrative   Married for 49 years.Retired,used to work at [employer].   Social Drivers of Corporate investment banker Strain: Low Risk  (01/09/2020)   Overall Financial Resource Strain (CARDIA)    Difficulty of Paying Living Expenses: Not hard at all  Food Insecurity: No Food Insecurity (01/09/2020)   Hunger Vital Sign    Worried About Running Out of Food in the Last Year: Never true    Ran Out of Food in the Last Year: Never true  Transportation Needs: No Transportation Needs (01/09/2020)   PRAPARE - Administrator, Civil Service (Medical): No    Lack of Transportation (Non-Medical): No  Physical Activity: Sufficiently Active (01/09/2020)   Exercise Vital Sign    Days of Exercise per Week: 5 days    Minutes of Exercise per Session: 30 min  Stress: No Stress Concern Present (01/09/2020)   [employer] of Occupational Health -  Occupational Stress Questionnaire    Feeling of Stress : Only a little  Social Connections: Socially Integrated (01/09/2020)   Social Connection and Isolation Panel [NHANES]    Frequency of Communication with Friends and Family: Once a week    Frequency of Social Gatherings with Friends and Family: More than three times a week    Attends Religious Services: More than 4 times per year    Active Member of Clubs or Organizations: Yes    Attends Engineer, structural: More than 4 times per year    Marital Status: Married    Allergies: No Known Allergies  Metabolic Disorder Labs: No results found for: "HGBA1C", "MPG" No results found for: "PROLACTIN" Lab Results  Component Value Date   CHOL 239 (H) 01/21/2021   TRIG 86 01/21/2021   HDL 65 01/21/2021   CHOLHDL 3.7 01/21/2021   LDLCALC 155 (H) 01/21/2021   LDLCALC 134 (H) 05/28/2020   Lab Results  Component Value Date   TSH 1.070 03/07/2022    Therapeutic Level Labs: No results found for: "LITHIUM" No results found for: "VALPROATE" No results found for: "CBMZ"  Current Medications: Current Outpatient Medications  Medication Sig Dispense Refill   ezetimibe (ZETIA) 10 MG tablet Take 10 mg by mouth. One three times per week     famotidine (PEPCID) 20 MG tablet Take 20 mg by mouth. Takes as needed     FLUoxetine (PROZAC) 10 MG capsule Take 3 capsules (30 mg total) by mouth as directed. Take 2 tablets daily AM and 1 tablet PM 270 capsule 1   methocarbamol (ROBAXIN) 500 MG tablet Take 500 mg by mouth 2 (two) times daily.     prazosin (MINIPRESS) 1 MG capsule TAKE 1 CAPSULE AT BEDTIME 90 capsule 3   propranolol (INDERAL) 10 MG tablet Take 10 mg by mouth 2 (two) times daily as needed.     QUEtiapine (SEROQUEL) 25 MG tablet TAKE 1 TABLET AT BEDTIME 90 tablet 3   No current facility-administered medications for this visit.     Musculoskeletal: Strength & Muscle Tone:  UTA Gait & Station:  Seated Patient leans:  N/A  Psychiatric Specialty Exam: Review of Systems  Constitutional:  Positive for fatigue.  HENT:  Positive for congestion and sore throat.   Psychiatric/Behavioral:  Positive for sleep disturbance. The patient is nervous/anxious.  Irritable and sad     There were no vitals taken for this visit.There is no height or weight on file to calculate BMI.  General Appearance: Casual  Eye Contact:  Fair  Speech:  Clear and Coherent  Volume:  Normal  Mood:  Anxious, irritable, sad   Affect:  Congruent  Thought Process:  Goal Directed and Descriptions of Associations: Intact  Orientation:  Full (Time, Place, and Person)  Thought Content: Logical   Suicidal Thoughts:  No  Homicidal Thoughts:  No  Memory:  Immediate;   Fair Recent;   Fair Remote;   Fair  Judgement:  Good  Insight:  Fair  Psychomotor Activity:  Normal  Concentration:  Concentration: Fair and Attention Span: Fair  Recall:  [employer] of Knowledge: Fair  Language: Fair  Akathisia:  No  Handed:  Right  AIMS (if indicated): not done  Assets:  Desire for Improvement Housing Intimacy Social Support Transportation  ADL's:  Intact  Cognition: WNL  Sleep:  Poor due to being sick now    Screenings: Geneticist, molecular Office Visit from 01/26/2023 in [facility] Office Visit from 05/11/2022 in [facility]  AIMS Total Score 0 0      GAD-7    Flowsheet Row Video Visit from 03/28/2023 in [facility] Office Visit from 01/26/2023 in [facility] Office Visit from 12/21/2022 in [facility] Office Visit from 08/24/2022 in [facility] Counselor from 06/02/2022 in [facility] [place]  Total GAD-7 Score 7 1 14 7 20       PHQ2-9    Flowsheet Row Video  Visit from 05/11/2023 in [facility] Office Visit from 01/26/2023 in [facility] Counselor from 12/26/2022 [facility] Office Visit from 12/21/2022 in [facility] Nutrition from 10/27/2022 in [facility] Total Score 0 0 5 5 0  PHQ-9 Total Score -- 3 18 20  --      Flowsheet Row Video Visit from 10/20/2023 in [facility] Video Visit from 08/10/2023 in [facility] Video Visit from 05/11/2023 in [facility]  C-SSRS RISK CATEGORY No Risk No Risk No Risk        Assessment and Plan: Morgan Fields is a 73 year old Caucasian female who has a history of PTSD, MDD, GAD was evaluated by telemedicine today.  Patient is currently struggling with flulike symptoms likely due to viral illness, which does have an impact on her mood and sleep, discussed assessment and plan as noted below.  Generalized Anxiety Disorder-unstable Increased anxiety and irritability due to current illness and husband's upcoming CT scan. Emphasized coping strategies and avoiding panic. - Continue Prozac 30 mg p.o. daily. - Continue Propranolol 10 mg twice a day as needed. - Schedule follow-up with new therapist - Message front desk to call on Monday to set up therapy appointment  Major Depressive Disorder-unstable Previously well-managed on Prozac. Current illness exacerbating mental health symptoms, causing feelings of being overwhelmed and dependent. Emphasized continuing medication and close monitoring during illness. -Continue Prozac 30 mg p.o. daily -Continue Seroquel 25 mg at bedtime  PTSD-improving Previously well-managed on Prozac however current illness exacerbating sleep and mood  symptoms. -  Continue Prozac 30 mg daily - Continue prazosin 1 mg at bedtime - Patient was encouraged to establish care with therapist in the past.   Follow-up - Call primary care doctor today - Schedule follow-up with new therapist - Follow-up appointment on November 23, 2023,    Collaboration of Care: Collaboration of Care: Referral or follow-up with counselor/therapist AEB I have communicated with staff to schedule this patient with our therapist, staff to contact patient on Monday.  Patient/Guardian was advised Release of Information must be obtained prior to any record release in order to collaborate their care with an outside provider. Patient/Guardian was advised if they have not already done so to contact the registration department to sign all necessary forms in order for [place] to release information regarding their care.   Consent: Patient/Guardian gives verbal consent for treatment and assignment of benefits for services provided during this visit. Patient/Guardian expressed understanding and agreed to proceed.   This note was generated in part or whole with voice recognition software. Voice recognition is usually quite accurate but there are transcription errors that can and very often do occur. I apologize for any typographical errors that were not detected and corrected.    Jomarie Longs, MD 10/20/2023, 1:37 PM

## 2023-10-23 ENCOUNTER — Ambulatory Visit
Admission: EM | Admit: 2023-10-23 | Discharge: 2023-10-23 | Disposition: A | Discharge status: Home / Self Care | Payer: Medicare Other | Attending: Nurse Practitioner | Admitting: Nurse Practitioner

## 2023-10-23 ENCOUNTER — Ambulatory Visit (INDEPENDENT_AMBULATORY_CARE_PROVIDER_SITE_OTHER): Payer: Medicare Other

## 2023-10-23 DIAGNOSIS — R052 Subacute cough: Secondary | ICD-10-CM | POA: Diagnosis not present

## 2023-10-23 DIAGNOSIS — Z87891 Personal history of nicotine dependence: Secondary | ICD-10-CM | POA: Diagnosis not present

## 2023-10-23 DIAGNOSIS — J189 Pneumonia, unspecified organism: Secondary | ICD-10-CM | POA: Diagnosis not present

## 2023-10-23 MED ORDER — AMOXICILLIN-POT CLAVULANATE 875-125 MG PO TABS: 1.0000 | ORAL_TABLET | Freq: Two times a day (BID) | ORAL | 0 refills | Status: AC

## 2023-10-23 MED ORDER — BENZONATATE 100 MG PO CAPS: 100.0000 mg | ORAL_CAPSULE | Freq: Three times a day (TID) | ORAL | 0 refills | Status: DC | PRN

## 2023-10-23 MED ORDER — DOXYCYCLINE HYCLATE 100 MG PO CAPS: 100.0000 mg | ORAL_CAPSULE | Freq: Two times a day (BID) | ORAL | 0 refills | Status: AC

## 2023-10-23 NOTE — ED Notes (Signed)
Pt reports cough, congestion x 10 days, still having breathing difficulty cough and chest pain loss of appetite.

## 2023-10-23 NOTE — Discharge Instructions (Signed)
You have pneumonia in the middle lobe of your right lung.  Take the Augmentin and doxycycline as prescribed to treat it.  In addition, you can take the [facility] every 8 hours as needed to help with the cough.  Also recommend staying hydrated with plenty of water and/or Pedialyte, guaifenesin 600 mg every 12 hours to help break up the mucus.  Seek care if symptoms worsen despite treatment.  Recommend follow-up with PCP within 6 weeks for repeat imaging of your chest to ensure full resolution of pneumonia.

## 2023-10-23 NOTE — ED Provider Notes (Signed)
[facility]    CSN: [id number] Arrival date & time: 10/23/23  4098      History   Chief Complaint No chief complaint on file.   HPI Morgan Fields is a 73 y.o. female.   Patient presents today with 10-day history of dry cough, shortness of breath with talking or walking around, chest tightness, decreased appetite, and fatigue.  She also reports chills that she had at first that are now improved.  No recent fever, congested cough, wheezing, or chest pain.  No runny or stuffy nose, sore throat, headache, ear pain, abdominal pain, nausea/vomiting, or diarrhea.  She reports her husband is sick with similar symptoms.  Has taken over-the-counter sinus/allergy medicine with mild temporary improvement.  Patient reports she is a current smoker, however has not smoked since she has been sick.    Past Medical History:  Diagnosis Date   Complication of anesthesia    takes a long time to wake up from anesthesia   Depression    Hiatal hernia    HLD (hyperlipidemia) 07/22/2019   IBS (irritable bowel syndrome)    Osteoporosis 07/22/2019   PMB (postmenopausal bleeding) 03/11/2016   Post-menopause on HRT (hormone replacement therapy) 03/11/2016   Skin cancer    Recent to anterior chest; cervical cancer   Thickened endometrium 03/16/2016   Vaginal Pap smear, abnormal    Vitamin D deficiency disease 07/22/2019    Patient Active Problem List   Diagnosis Date Noted   IBS (irritable bowel syndrome) 09/22/2023   Bereavement 08/10/2023   MDD (major depressive disorder), recurrent, in full remission (HCC) 01/27/2023   Generalized abdominal pain 09/28/2022   Nausea and vomiting 09/28/2022   Gastroesophageal reflux disease 09/28/2022   Complication of anesthesia 09/27/2022   Recurrent moderate major depressive disorder with anxiety (HCC) 08/24/2022   MDD (major depressive disorder), recurrent episode, mild (HCC) 05/11/2022   Current moderate episode of major depressive disorder  without prior episode (HCC) 03/15/2022   PTSD (post-traumatic stress disorder) 02/15/2022   GAD (generalized anxiety disorder) 02/15/2022   Depression 02/15/2022   Tobacco use disorder 02/15/2022   High risk medication use 02/15/2022   Encounter for colorectal cancer screening 01/09/2020   Encounter for well woman exam with routine gynecological exam 01/09/2020   Sebaceous cyst 11/27/2019   Vitamin D deficiency disease 07/22/2019   Osteoporosis 07/22/2019   HLD (hyperlipidemia) 07/22/2019   Encounter for gynecological examination with Papanicolaou smear of cervix 11/28/2017   Thickened endometrium 03/16/2016   PMB (postmenopausal bleeding) 03/11/2016   Post-menopause on HRT (hormone replacement therapy) 03/11/2016   Loss of weight 04/15/2013   Chronic diarrhea 04/15/2013   Skin cancer 04/15/2013    Past Surgical History:  Procedure Laterality Date   BIOPSY  11/08/2022   Procedure: BIOPSY;  Surgeon: Dolores Frame, MD;  Location: [facility];  Service: Gastroenterology;;   BIOPSY  12/14/2022   Procedure: BIOPSY;  Surgeon: Dolores Frame, MD;  Location: [facility];  Service: Gastroenterology;;   BREAST ENHANCEMENT SURGERY     CERVICAL BIOPSY     precancerous years ago   CHOLECYSTECTOMY  2011   COLONOSCOPY N/A 05/08/2013   Procedure: COLONOSCOPY;  Surgeon: Malissa Hippo, MD;  Location: [facility];  Service: Endoscopy;  Laterality: N/A;  200   COLONOSCOPY WITH PROPOFOL N/A 12/14/2022   Procedure: COLONOSCOPY WITH PROPOFOL;  Surgeon: Dolores Frame, MD;  Location: [facility];  Service: Gastroenterology;  Laterality: N/A;  9:30am;ASA 2   ESOPHAGOGASTRODUODENOSCOPY (  EGD) WITH PROPOFOL N/A 11/08/2022   Procedure: ESOPHAGOGASTRODUODENOSCOPY (EGD) WITH PROPOFOL;  Surgeon: Dolores Frame, MD;  Location: [facility];  Service: Gastroenterology;  Laterality: N/A;  10:15 AM   HYSTEROSCOPY WITH D & C N/A 10/05/2016   Procedure: HYSTEROSCOPY;  UTERINE CURETTAGE;  Surgeon: Lazaro Arms, MD;  Location: [facility];  Service: Gynecology;  Laterality: N/A;   POLYPECTOMY N/A 10/05/2016   Procedure: REMOVAL OF ENDOMETRIAL POLYP;  Surgeon: Lazaro Arms, MD;  Location: [facility];  Service: Gynecology;  Laterality: N/A;   TUBAL LIGATION      OB History     Gravida  3   Para  2   Term  1   Preterm  1   AB  1   Living  2      SAB  1   IAB      Ectopic      Multiple      Live Births               Home Medications    Prior to Admission medications   Medication Sig Start Date End Date Taking? Authorizing Provider  amoxicillin-clavulanate (AUGMENTIN) 875-125 MG tablet Take 1 tablet by mouth 2 (two) times daily for 7 days. 10/23/23 10/30/23 Yes Valentino Nose, NP  benzonatate (TESSALON) 100 MG capsule Take 1 capsule (100 mg total) by mouth 3 (three) times daily as needed for cough. Do not take with alcohol or while driving or operating heavy machinery.  May cause drowsiness. 10/23/23  Yes Valentino Nose, NP  doxycycline (VIBRAMYCIN) 100 MG capsule Take 1 capsule (100 mg total) by mouth 2 (two) times daily for 7 days. 10/23/23 10/30/23 Yes Valentino Nose, NP  ezetimibe (ZETIA) 10 MG tablet Take 10 mg by mouth. One three times per week    [provider]  famotidine (PEPCID) 20 MG tablet Take 20 mg by mouth. Takes as needed    [provider]  FLUoxetine (PROZAC) 10 MG capsule Take 3 capsules (30 mg total) by mouth as directed. Take 2 tablets daily AM and 1 tablet PM 10/20/23   Eappen, Levin Bacon, MD  methocarbamol (ROBAXIN) 500 MG tablet Take 500 mg by mouth 2 (two) times daily. 05/30/23   [provider]  prazosin (MINIPRESS) 1 MG capsule TAKE 1 CAPSULE AT BEDTIME 08/04/23   Jomarie Longs, MD  propranolol (INDERAL) 10 MG tablet Take 10 mg by mouth 2 (two) times daily as needed.    [provider]  QUEtiapine (SEROQUEL) 25 MG tablet TAKE 1 TABLET AT BEDTIME 08/04/23   Jomarie Longs, MD     Family History Family History  Problem Relation Age of Onset   Dementia Mother    Hyperlipidemia Mother    Thyroid disease Mother    Hypertension Mother    Schizophrenia Father    Alcohol abuse Father        Schizophrenia   Depression Brother    Mental illness Brother        Schizophrenia (2 brothers); schizophrenia and bipolar (1 brother)   Heart attack Brother    Schizophrenia Brother    Kidney failure Brother    Bipolar disorder Brother    Alcohol abuse Maternal Uncle    Alcohol abuse Paternal Uncle    Alzheimer's disease Maternal Grandfather    Cancer Maternal Grandmother        colon   Diabetes Maternal Grandmother    Heart attack Paternal Grandfather    Heart disease  Paternal Grandfather    Diabetes Paternal Grandfather    Mental illness Paternal Grandmother    Polycystic ovary syndrome Daughter    Other Daughter        knee replacement   Thyroid disease Daughter        had radiation on thyroid    Social History Social History   Tobacco Use   Smoking status: Every Day    Current packs/day: 0.25    Average packs/day: 0.3 packs/day for 37.0 years (9.3 ttl pk-yrs)    Types: Cigarettes    Passive exposure: Current   Smokeless tobacco: Never   Tobacco comments:    smokes 5 cig daily- cutting back  Vaping Use   Vaping status: Never Used  Substance Use Topics   Alcohol use: Not Currently    Comment: Quit September 2019   Drug use: No     Allergies   Patient has no known allergies.   Review of Systems Review of Systems Per HPI  Physical Exam Triage Vital Signs ED Triage Vitals  Encounter Vitals Group     BP 10/23/23 1015 125/78     Systolic BP Percentile --      Diastolic BP Percentile --      Pulse Rate 10/23/23 1015 78     Resp 10/23/23 1015 15     Temp 10/23/23 1015 98.8 F (37.1 C)     Temp Source 10/23/23 1015 Oral     SpO2 10/23/23 1015 90 %     Weight --      Height --      Head Circumference --      Peak Flow --      Pain  Score 10/23/23 1018 0     Pain Loc --      Pain Education --      Exclude from Growth Chart --    No data found.  Updated Vital Signs BP 125/78 (BP Location: Right Arm)   Pulse 78   Temp 98.8 F (37.1 C) (Oral)   Resp 15   SpO2 90%   Visual Acuity Right Eye Distance:   Left Eye Distance:   Bilateral Distance:    Right Eye Near:   Left Eye Near:    Bilateral Near:     Physical Exam Vitals and nursing note reviewed.  Constitutional:      General: She is not in acute distress.    Appearance: Normal appearance. She is not ill-appearing or toxic-appearing.  HENT:     Head: Normocephalic and atraumatic.     Right Ear: Tympanic membrane, ear canal and external ear normal.     Left Ear: Tympanic membrane, ear canal and external ear normal.     Nose: No congestion or rhinorrhea.     Mouth/Throat:     Mouth: Mucous membranes are moist.     Pharynx: Oropharynx is clear. No oropharyngeal exudate or posterior oropharyngeal erythema.  Eyes:     General: No scleral icterus.    Extraocular Movements: Extraocular movements intact.  Cardiovascular:     Rate and Rhythm: Normal rate and regular rhythm.  Pulmonary:     Effort: Pulmonary effort is normal. No respiratory distress.     Breath sounds: Examination of the right-upper field reveals rhonchi. Examination of the right-middle field reveals rhonchi. Examination of the right-lower field reveals rhonchi. Rhonchi present. No wheezing or rales.  Musculoskeletal:     Cervical back: Normal range of motion and neck supple.  Lymphadenopathy:  Cervical: No cervical adenopathy.  Skin:    General: Skin is warm and dry.     Coloration: Skin is not jaundiced or pale.     Findings: No erythema or rash.  Neurological:     Mental Status: She is alert and oriented to person, place, and time.  Psychiatric:        Behavior: Behavior is cooperative.      UC Treatments / Results  Labs (all labs ordered are listed, but only abnormal  results are displayed) Labs Reviewed - No data to display  EKG   Radiology DG Chest 2 View Result Date: 10/23/2023 CLINICAL DATA:  Cough EXAM: CHEST - 2 VIEW COMPARISON:  10/30/2013 FINDINGS: Airspace opacity anteriorly on the lateral view may reflect infiltrate in the right middle lobe, not well seen on the frontal view. No effusions. Heart mediastinal contours are within normal limits. No acute bony abnormality. IMPRESSION: Airspace opacity anteriorly on the lateral view, suspect right middle lobe infiltrate/pneumonia. Electronically Signed   By: Charlett Nose M.D.   On: 10/23/2023 11:04    Procedures Procedures (including critical care time)  Medications Ordered in UC Medications - No data to display  Initial Impression / Assessment and Plan / UC Course  I have reviewed the triage vital signs and the nursing notes.  Pertinent labs & imaging results that were available during my care of the patient were reviewed by me and considered in my medical decision making (see chart for details).   Patient is well-appearing, normotensive, afebrile, not tachycardic, not tachypneic, oxygenating well on room air.    1. Subacute cough 2. Pneumonia of right middle lobe due to infectious organism 3. History of cigarette smoking Chest x-ray obtained and shows right middle lobe infiltrate/pneumonia Given history of smoking, will prescribe patient Augmentin and doxycycline to take to treat for pneumonia Other supportive care discussed including guaifenesin, Tessalon Perles as needed Recommended close follow-up with PCP within 6 weeks for repeat imaging to ensure for resolution of symptoms Return and ER precautions discussed in the meantime  The patient was given the opportunity to ask questions.  All questions answered to their satisfaction.  The patient is in agreement to this plan.    Final Clinical Impressions(s) / UC Diagnoses   Final diagnoses:  Subacute cough  Pneumonia of right middle lobe  due to infectious organism  History of cigarette smoking     Discharge Instructions      You have pneumonia in the middle lobe of your right lung.  Take the Augmentin and doxycycline as prescribed to treat it.  In addition, you can take the [facility] every 8 hours as needed to help with the cough.  Also recommend staying hydrated with plenty of water and/or Pedialyte, guaifenesin 600 mg every 12 hours to help break up the mucus.  Seek care if symptoms worsen despite treatment.  Recommend follow-up with PCP within 6 weeks for repeat imaging of your chest to ensure full resolution of pneumonia.    ED Prescriptions     Medication Sig Dispense Auth. Provider   doxycycline (VIBRAMYCIN) 100 MG capsule Take 1 capsule (100 mg total) by mouth 2 (two) times daily for 7 days. 14 capsule Cathlean Marseilles A, NP   amoxicillin-clavulanate (AUGMENTIN) 875-125 MG tablet Take 1 tablet by mouth 2 (two) times daily for 7 days. 14 tablet Cathlean Marseilles A, NP   benzonatate (TESSALON) 100 MG capsule Take 1 capsule (100 mg total) by mouth 3 (three) times daily as  needed for cough. Do not take with alcohol or while driving or operating heavy machinery.  May cause drowsiness. 21 capsule Valentino Nose, NP      PDMP not reviewed this encounter.   Valentino Nose, NP 10/23/23 1425

## 2023-10-28 ENCOUNTER — Other Ambulatory Visit (INDEPENDENT_AMBULATORY_CARE_PROVIDER_SITE_OTHER): Payer: Self-pay | Admitting: Gastroenterology

## 2023-10-30 NOTE — Telephone Encounter (Signed)
 Tried to call to see if patient requested no answer. Ov on 09/22/23 she told me she was not taking med.

## 2023-10-31 NOTE — Telephone Encounter (Signed)
Tried to call home number and no answer. Left message on husband's cell number to return call

## 2023-11-23 ENCOUNTER — Encounter: Payer: Self-pay | Admitting: Psychiatry

## 2023-11-23 ENCOUNTER — Ambulatory Visit: Payer: Medicare Other | Admitting: Psychiatry

## 2023-11-23 ENCOUNTER — Ambulatory Visit
Admission: RE | Admit: 2023-11-23 | Discharge: 2023-11-23 | Disposition: A | Discharge status: Home / Self Care | Source: Ambulatory Visit | Attending: Psychiatry | Admitting: Psychiatry

## 2023-11-23 VITALS — BP 136/82 | HR 91 | Temp 98.7°F | Ht 62.0 in | Wt 117.0 lb

## 2023-11-23 DIAGNOSIS — F411 Generalized anxiety disorder: Secondary | ICD-10-CM

## 2023-11-23 DIAGNOSIS — F17200 Nicotine dependence, unspecified, uncomplicated: Secondary | ICD-10-CM | POA: Diagnosis not present

## 2023-11-23 DIAGNOSIS — F3181 Bipolar II disorder: Secondary | ICD-10-CM | POA: Diagnosis not present

## 2023-11-23 DIAGNOSIS — F4310 Post-traumatic stress disorder, unspecified: Secondary | ICD-10-CM

## 2023-11-23 DIAGNOSIS — Z9189 Other specified personal risk factors, not elsewhere classified: Secondary | ICD-10-CM

## 2023-11-23 DIAGNOSIS — I4519 Other right bundle-branch block: Secondary | ICD-10-CM | POA: Insufficient documentation

## 2023-11-23 LAB — LAB REPORT - SCANNED
Calcium: 9.6
EGFR: 76

## 2023-11-23 NOTE — Progress Notes (Signed)
 [facility] MD OP Progress Note  11/23/2023 9:05 AM Morgan Fields  MRN:  [id number]  Chief Complaint:  Chief Complaint  Patient presents with   Follow-up   Anxiety   Depression   Manic Behavior   Medication Refill   HPI: Morgan Fields is a 73 year old Caucasian female, married, retired, lives in [place], has a history of PTSD, GAD, depression, tobacco use disorder, arthritis, osteoporosis was evaluated in office today.  She has been experiencing escalating mood symptoms characterized by elevated mood, increased energy, and hyperactivity following a recent treatment for pneumonia. She was treated with antibiotics for 17 days, which significantly improved her pneumonia symptoms, but subsequently led to a period of mood elevation.  She has a long-standing history of mood swings, with episodes of high energy and irritability. In 2004, she was prescribed Prozac and Zyprexa, which she took until 2010, during which she felt stable and functioned well at work. After discontinuing Zyprexa in 2010, she noticed a return of mood instability.  Currently, she experiences increased talkativeness, irritability, and anger, affecting her relationship with her husband. She has increased activity levels, including cleaning and painting, and has gained 5 pounds since her illness. She has difficulty sleeping, requiring Seroquel to aid sleep, and experiences vivid dreams involving themes of being pursued or attacked.  Her family history is notable for bipolar disorder in her daughter-in-law and two brothers. She also has a history of impulsive financial decisions, such as owning three houses at once and accruing significant credit card debt, which she did not associate with her mood at the time.  She is currently taking Seroquel, which helps with sleep, and fluoxetine, taken in the morning and at night. She has noticed short-term memory issues, such as leaving food on the stove, which she attributes to her  current mood state.  Denies suicidal thoughts or thoughts of harming others.   She is currently compliant on her medications.  Denies side effects.  She is interested in referral for psychotherapy, currently does not have a therapist.   Visit Diagnosis:    ICD-10-CM   1. PTSD (post-traumatic stress disorder)  F43.10     2. GAD (generalized anxiety disorder)  F41.1     3. Bipolar II disorder, most recent episode hypomanic (HCC)  F31.81     4. Tobacco use disorder  F17.200     5. At risk for prolonged QT interval syndrome  Z91.89 EKG 12-Lead      Past Psychiatric History: I have reviewed past psychiatric history from progress note on 02/15/2022.  Past trials of medications like Lexapro, hydroxyzine, Cymbalta, olanzapine  Past Medical History:  Past Medical History:  Diagnosis Date   Complication of anesthesia    takes a long time to wake up from anesthesia   Depression    Hiatal hernia    HLD (hyperlipidemia) 07/22/2019   IBS (irritable bowel syndrome)    Osteoporosis 07/22/2019   PMB (postmenopausal bleeding) 03/11/2016   Post-menopause on HRT (hormone replacement therapy) 03/11/2016   Skin cancer    Recent to anterior chest; cervical cancer   Thickened endometrium 03/16/2016   Vaginal Pap smear, abnormal    Vitamin D deficiency disease 07/22/2019    Past Surgical History:  Procedure Laterality Date   BIOPSY  11/08/2022   Procedure: BIOPSY;  Surgeon: Dolores Frame, MD;  [facility];  Service: Gastroenterology;;   BIOPSY  12/14/2022   Procedure: BIOPSY;  Surgeon: Dolores Frame, MD;  [facility];  Service: Gastroenterology;;   BREAST ENHANCEMENT SURGERY     CERVICAL BIOPSY     precancerous years ago   CHOLECYSTECTOMY  2011   COLONOSCOPY N/A 05/08/2013   Procedure: COLONOSCOPY;  Surgeon: Malissa Hippo, MD;  [facility];  Service: Endoscopy;  Laterality: N/A;  200   COLONOSCOPY WITH PROPOFOL N/A 12/14/2022    Procedure: COLONOSCOPY WITH PROPOFOL;  Surgeon: Dolores Frame, MD;  [facility];  Service: Gastroenterology;  Laterality: N/A;  9:30am;ASA 2   ESOPHAGOGASTRODUODENOSCOPY (EGD) WITH PROPOFOL N/A 11/08/2022   Procedure: ESOPHAGOGASTRODUODENOSCOPY (EGD) WITH PROPOFOL;  Surgeon: Dolores Frame, MD;  [facility];  Service: Gastroenterology;  Laterality: N/A;  10:15 AM   HYSTEROSCOPY WITH D & C N/A 10/05/2016   Procedure: HYSTEROSCOPY; UTERINE CURETTAGE;  Surgeon: Lazaro Arms, MD;  Location: [facility];  Service: Gynecology;  Laterality: N/A;   POLYPECTOMY N/A 10/05/2016   Procedure: REMOVAL OF ENDOMETRIAL POLYP;  Surgeon: Lazaro Arms, MD;  Location: [facility];  Service: Gynecology;  Laterality: N/A;   TUBAL LIGATION      Family Psychiatric History: I have reviewed family psychiatric history from progress note on 02/15/2022.  Family History:  Family History  Problem Relation Age of Onset   Dementia Mother    Hyperlipidemia Mother    Thyroid disease Mother    Hypertension Mother    Schizophrenia Father    Alcohol abuse Father        Schizophrenia   Depression Brother    Mental illness Brother        Schizophrenia (2 brothers); schizophrenia and bipolar (1 brother)   Heart attack Brother    Schizophrenia Brother    Kidney failure Brother    Bipolar disorder Brother    Alcohol abuse Maternal Uncle    Alcohol abuse Paternal Uncle    Alzheimer's disease Maternal Grandfather    Cancer Maternal Grandmother        colon   Diabetes Maternal Grandmother    Heart attack Paternal Grandfather    Heart disease Paternal Grandfather    Diabetes Paternal Grandfather    Mental illness Paternal Grandmother    Polycystic ovary syndrome Daughter    Other Daughter        knee replacement   Thyroid disease Daughter        had radiation on thyroid    Social History: I have reviewed social history from progress note on 02/15/2022. Social History    Socioeconomic History   Marital status: Married    Spouse name: Not on file   Number of children: Not on file   Years of education: Not on file   Highest education level: Not on file  Occupational History   Not on file  Tobacco Use   Smoking status: Every Day    Current packs/day: 0.25    Average packs/day: 0.3 packs/day for 37.0 years (9.3 ttl pk-yrs)    Types: Cigarettes    Passive exposure: Current   Smokeless tobacco: Never   Tobacco comments:    smokes 5 cig daily- cutting back  Vaping Use   Vaping status: Never Used  Substance and Sexual Activity   Alcohol use: Not Currently    Comment: Quit September 2019   Drug use: No   Sexual activity: Yes    Birth control/protection: Post-menopausal  Other Topics Concern   Not on file  Social History Narrative   Married for 49 years.Retired,used to work at [employer].  Social Drivers of Corporate investment banker Strain: Low Risk  (01/09/2020)   Overall Financial Resource Strain (CARDIA)    Difficulty of Paying Living Expenses: Not hard at all  Food Insecurity: No Food Insecurity (01/09/2020)   Hunger Vital Sign    Worried About Running Out of Food in the Last Year: Never true    Ran Out of Food in the Last Year: Never true  Transportation Needs: No Transportation Needs (01/09/2020)   PRAPARE - Administrator, Civil Service (Medical): No    Lack of Transportation (Non-Medical): No  Physical Activity: Sufficiently Active (01/09/2020)   Exercise Vital Sign    Days of Exercise per Week: 5 days    Minutes of Exercise per Session: 30 min  Stress: No Stress Concern Present (01/09/2020)   [employer] of Occupational Health - Occupational Stress Questionnaire    Feeling of Stress : Only a little  Social Connections: Socially Integrated (01/09/2020)   Social Connection and Isolation Panel [NHANES]    Frequency of Communication with Friends and Family: Once a week    Frequency of Social Gatherings with  Friends and Family: More than three times a week    Attends Religious Services: More than 4 times per year    Active Member of Clubs or Organizations: Yes    Attends Engineer, structural: More than 4 times per year    Marital Status: Married    Allergies: No Known Allergies  Metabolic Disorder Labs: No results found for: "HGBA1C", "MPG" No results found for: "PROLACTIN" Lab Results  Component Value Date   CHOL 239 (H) 01/21/2021   TRIG 86 01/21/2021   HDL 65 01/21/2021   CHOLHDL 3.7 01/21/2021   LDLCALC 155 (H) 01/21/2021   LDLCALC 134 (H) 05/28/2020   Lab Results  Component Value Date   TSH 1.070 03/07/2022    Therapeutic Level Labs: No results found for: "LITHIUM" No results found for: "VALPROATE" No results found for: "CBMZ"  Current Medications: Current Outpatient Medications  Medication Sig Dispense Refill   colestipol (COLESTID) 1 g tablet Take 1 g by mouth 2 (two) times daily.     ezetimibe (ZETIA) 10 MG tablet Take 10 mg by mouth. One three times per week     famotidine (PEPCID) 20 MG tablet Take 20 mg by mouth. Takes as needed     FLUoxetine (PROZAC) 10 MG capsule Take 3 capsules (30 mg total) by mouth as directed. Take 2 tablets daily AM and 1 tablet PM 270 capsule 1   methocarbamol (ROBAXIN) 500 MG tablet Take 500 mg by mouth 2 (two) times daily.     prazosin (MINIPRESS) 1 MG capsule TAKE 1 CAPSULE AT BEDTIME 90 capsule 3   propranolol (INDERAL) 10 MG tablet Take 10 mg by mouth 2 (two) times daily as needed.     QUEtiapine (SEROQUEL) 25 MG tablet TAKE 1 TABLET AT BEDTIME 90 tablet 3   No current facility-administered medications for this visit.     Musculoskeletal: Strength & Muscle Tone: within normal limits Gait & Station: normal Patient leans: N/A  Psychiatric Specialty Exam: Review of Systems  Psychiatric/Behavioral:  Positive for decreased concentration. The patient is nervous/anxious.        Hypomanic, irritable    Blood pressure  136/82, pulse 91, temperature 98.7 F (37.1 C), temperature source Temporal, height 5\' 2"  (1.575 m), weight 117 lb (53.1 kg), SpO2 95%.Body mass index is 21.4 kg/m.  General Appearance: Casual  Eye Contact:  Good  Speech:  Clear and Coherent  Volume:  Normal  Mood:  Anxious and hypomanic  Affect:  Appropriate  Thought Process:  Goal Directed and Descriptions of Associations: Intact  Orientation:  Full (Time, Place, and Person)  Thought Content: Logical   Suicidal Thoughts:  No  Homicidal Thoughts:  No  Memory:  Immediate;   Fair Recent;   Fair Remote;   Fair  Judgement:  Fair  Insight:  Fair  Psychomotor Activity:  Normal  Concentration:  Concentration: Fair and Attention Span: Fair  Recall:  [employer] of Knowledge: Fair  Language: Fair  Akathisia:  No  Handed:  Right  AIMS (if indicated): done  Assets:  Communication Skills Desire for Improvement Housing Social Support Talents/Skills Transportation  ADL's:  Intact  Cognition: WNL  Sleep:  Fair does have vivid dreams   Screenings: Geneticist, molecular Office Visit from 01/26/2023 in [facility] Office Visit from 05/11/2022 in [facility]  AIMS Total Score 0 0      GAD-7    Flowsheet Row Office Visit from 11/23/2023 in [facility] Video Visit from 03/28/2023 in [facility] Office Visit from 01/26/2023 in [facility] Office Visit from 12/21/2022 in [facility] Office Visit from 08/24/2022 in [facility]  Total GAD-7 Score 21 7 1 14 7       PHQ2-9    Flowsheet Row Office Visit from 11/23/2023 in [facility] Video Visit from 05/11/2023 in [facility] Office Visit from  01/26/2023 in [facility] Counselor from [facility]/26[facility]2022 [facility] Office Visit from 12/21/2022 in [facility]  PHQ-2 Total Score 2 0 0 5 5  PHQ-9 Total Score 11 -- 3 18 20       Flowsheet Row Office Visit from 11/23/2023 in [facility] ED from 10/23/2023 in [facility] [place] [facility] Video Visit from 10/20/2023 in [facility]  C-SSRS RISK CATEGORY No Risk No Risk No Risk        Assessment and Plan: EMERIE VANDERKOLK is a 73 year old Caucasian female who has a history of PTSD, MDD, GAD was evaluated in office today.  Discussed the following assessment and plan with patient.  Bipolar Disorder Type II-unstable Symptoms consistent with Bipolar Disorder Type II, including hypomanic episodes without full manic episodes. Reports mood swings, recent increased energy, irritability, and anger post-pneumonia recovery. Family history of bipolar disorder.  Mood disorder questionnaire completed in session today and patient scored high on the same.  Previously managed with Zyprexa, now on Seroquel . Also on fluoxetine for PTSD. Plan to increase Seroquel dosage, pending EKG due to age-related cardiac risk. Discussed Seroquel's lower risk of abnormal involuntary movements compared to Zyprexa. Zyprexa may be reconsidered if Seroquel is ineffective. - Order EKG to monitor cardiac side effects before increasing Seroquel dosage - Increase Seroquel dosage to 50 mg after EKG review - Consider reintroducing Zyprexa if Seroquel is ineffective - Discuss potential side effects of Seroquel and Zyprexa - Encourage therapy for additional support  Post-Traumatic Stress Disorder (PTSD)-improving PTSD managed with fluoxetine. No current depressive symptoms but experiences anger and irritability, potentially related to both PTSD and bipolar  disorder. - Continue Fluoxetine 30 mg  daily - Continue Prazosin 1 mg at bedtime - Encourage therapy for PTSD management  Generalized anxiety disorder-unstable Current anxiety due to concerns about current hypomanic symptoms, relationship struggles due to the fact. - Encouraged to schedule appointment with therapist, in-house - Continue Prozac 30 mg daily - Continue Propranolol 10 mg twice a day as needed - Plan to increase Seroquel as noted above.   At risk for prolonged QT syndrome-ordered EKG.  Patient to call [phone].   Follow-up Follow-up needed to assess increased Seroquel dosage effectiveness and review EKG results. Prefers video visit due to transportation limitations. - Schedule follow-up appointment in three weeks, preferably via video visit - Ensure EKG results are reviewed before increasing Seroquel dosage - Send updated Seroquel prescription to [facility] on [address] after EKG review  Collaboration of Care: Collaboration of Care: Referral or follow-up with counselor/therapist AEB patient encouraged to establish care with therapist.  Patient/Guardian was advised Release of Information must be obtained prior to any record release in order to collaborate their care with an outside provider. Patient/Guardian was advised if they have not already done so to contact the registration department to sign all necessary forms in order for [place] to release information regarding their care.   Consent: Patient/Guardian gives verbal consent for treatment and assignment of benefits for services provided during this visit. Patient/Guardian expressed understanding and agreed to proceed.  Discussed the use of a AI scribe software for clinical note transcription with the patient, who gave verbal consent to proceed.  This note was generated in part or whole with voice recognition software. Voice recognition is usually quite accurate but there are transcription errors that can and very often do  occur. I apologize for any typographical errors that were not detected and corrected.     Jomarie Longs, MD 11/24/2023, 8:05 AM

## 2023-11-23 NOTE — Patient Instructions (Signed)
 Please call for [phone]

## 2023-11-24 ENCOUNTER — Telehealth: Payer: Self-pay | Admitting: Psychiatry

## 2023-11-24 MED ORDER — QUETIAPINE FUMARATE 50 MG PO TABS
50.0000 mg | ORAL_TABLET | Freq: Every day | ORAL | 1 refills | Status: DC
Start: 2023-11-24 — End: 2024-01-16

## 2023-11-24 NOTE — Telephone Encounter (Signed)
 I have reviewed EKG dated 11/23/2023-normal sinus rhythm incomplete right bundle branch block, QTc 435. No changes compared to previous EKG.  Will increase Seroquel to 50 mg at bedtime.  I have sent the medication to [facility] pharmacy.

## 2023-11-24 NOTE — Telephone Encounter (Signed)
 Pt has been notified.

## 2023-11-30 ENCOUNTER — Telehealth: Payer: Self-pay | Admitting: Psychiatry

## 2023-11-30 NOTE — Telephone Encounter (Signed)
 I have reviewed labs dated 11/22/2023-CBC with differential-within normal limits, CMP-within normal limits, cholesterol abnormal at 221, LDL high at 120 otherwise within normal limits-lipid panel.

## 2023-12-22 ENCOUNTER — Telehealth: Admitting: Psychiatry

## 2023-12-22 ENCOUNTER — Encounter: Payer: Self-pay | Admitting: Psychiatry

## 2023-12-22 DIAGNOSIS — F17210 Nicotine dependence, cigarettes, uncomplicated: Secondary | ICD-10-CM | POA: Diagnosis not present

## 2023-12-22 DIAGNOSIS — F4310 Post-traumatic stress disorder, unspecified: Secondary | ICD-10-CM | POA: Diagnosis not present

## 2023-12-22 DIAGNOSIS — F3181 Bipolar II disorder: Secondary | ICD-10-CM | POA: Diagnosis not present

## 2023-12-22 DIAGNOSIS — F17200 Nicotine dependence, unspecified, uncomplicated: Secondary | ICD-10-CM

## 2023-12-22 DIAGNOSIS — F411 Generalized anxiety disorder: Secondary | ICD-10-CM | POA: Diagnosis not present

## 2023-12-22 NOTE — Progress Notes (Signed)
 Virtual Visit via Video Note  I connected with Morgan Fields on 12/22/23 at  9:30 AM EDT by a video enabled telemedicine application and verified that I am speaking with the correct person using two identifiers.  Location Provider Location : [facility] Patient Location : Home  Participants: Patient , Provider   I discussed the limitations of evaluation and management by telemedicine and the availability of in person appointments. The patient expressed understanding and agreed to proceed.    I discussed the assessment and treatment plan with the patient. The patient was provided an opportunity to ask questions and all were answered. The patient agreed with the plan and demonstrated an understanding of the instructions.   The patient was advised to call back or seek an in-person evaluation if the symptoms worsen or if the condition fails to improve as anticipated.   BH MD OP Progress Note  12/22/2023 4:31 PM Morgan Fields  MRN:  [id number]  Chief Complaint:  Chief Complaint  Patient presents with   Follow-up   Anxiety   Manic Behavior   Depression   Medication Refill   HPI: Morgan Fields is a 73 year old Caucasian female, retired, lives in [place], has a history of PTSD, GAD, bipolar disorder, tobacco use disorder, arthritis, osteoporosis, IBS was evaluated by telemedicine today.  She has experienced significant improvement in mood symptoms since her last visit three weeks ago, following an increase in Seroquel dosage to 50 mg. Initially, she had an episode of feeling 'wired' and very irritable, which resolved after a long sleep. She is now more aware of her mood changes and better able to manage them, although she still experiences periods of increased activity and occasional depression.  Her sleep has improved significantly. Initially, she slept for 12-14 hours after starting the medication, which she attributes to previously poor sleep quality. Currently, she maintains  a regular sleep schedule, going to bed around 8:30 PM and waking up at 5:30-6:00 AM, with occasional short naps.  She mentions a history of gastrointestinal issues, which improved after completing a course of antibiotics for pneumonia. She follows a predominantly plant-based diet, which she believes has contributed to her improved digestive health.  She reports that her osteoarthritis pain has decreased, attributing this to reduced inflammation.  She maintains a stable weight of 116-117 pounds. No abnormal involuntary movements, tremors, or muscle spasms.   She denies any suicidality, homicidality or perceptual disturbances.  Visit Diagnosis:    ICD-10-CM   1. PTSD (post-traumatic stress disorder)  F43.10     2. GAD (generalized anxiety disorder)  F41.1     3. Bipolar II disorder, most recent episode hypomanic (HCC)  F31.81     4. Tobacco use disorder  F17.200       Past Psychiatric History: I have reviewed past psychiatric history from progress note on 02/15/2022.  Past trials of medications Lexapro, hydroxyzine, Cymbalta, olanzapine  Past Medical History:  Past Medical History:  Diagnosis Date   Complication of anesthesia    takes a long time to wake up from anesthesia   Depression    Hiatal hernia    HLD (hyperlipidemia) 07/22/2019   IBS (irritable bowel syndrome)    Osteoporosis 07/22/2019   PMB (postmenopausal bleeding) 03/11/2016   Post-menopause on HRT (hormone replacement therapy) 03/11/2016   Skin cancer    Recent to anterior chest; cervical cancer   Thickened endometrium 03/16/2016   Vaginal Pap smear, abnormal    Vitamin D deficiency disease 07/22/2019  Past Surgical History:  Procedure Laterality Date   BIOPSY  11/08/2022   Procedure: BIOPSY;  Surgeon: Dolores Frame, MD;  [facility];  Service: Gastroenterology;;   BIOPSY  12/14/2022   Procedure: BIOPSY;  Surgeon: Dolores Frame, MD;  [facility];  Service:  Gastroenterology;;   BREAST ENHANCEMENT SURGERY     CERVICAL BIOPSY     precancerous years ago   CHOLECYSTECTOMY  2011   COLONOSCOPY N/A 05/08/2013   Procedure: COLONOSCOPY;  Surgeon: Malissa Hippo, MD;  [facility];  Service: Endoscopy;  Laterality: N/A;  200   COLONOSCOPY WITH PROPOFOL N/A 12/14/2022   Procedure: COLONOSCOPY WITH PROPOFOL;  Surgeon: Dolores Frame, MD;  [facility];  Service: Gastroenterology;  Laterality: N/A;  9:30am;ASA 2   ESOPHAGOGASTRODUODENOSCOPY (EGD) WITH PROPOFOL N/A 11/08/2022   Procedure: ESOPHAGOGASTRODUODENOSCOPY (EGD) WITH PROPOFOL;  Surgeon: Dolores Frame, MD;  [facility];  Service: Gastroenterology;  Laterality: N/A;  10:15 AM   HYSTEROSCOPY WITH D & C N/A 10/05/2016   Procedure: HYSTEROSCOPY; UTERINE CURETTAGE;  Surgeon: Lazaro Arms, MD;  Location: [facility];  Service: Gynecology;  Laterality: N/A;   POLYPECTOMY N/A 10/05/2016   Procedure: REMOVAL OF ENDOMETRIAL POLYP;  Surgeon: Lazaro Arms, MD;  Location: [facility];  Service: Gynecology;  Laterality: N/A;   TUBAL LIGATION      Family Psychiatric History: I have reviewed family psychiatric history from progress note on 02/15/2022.  Family History:  Family History  Problem Relation Age of Onset   Dementia Mother    Hyperlipidemia Mother    Thyroid disease Mother    Hypertension Mother    Schizophrenia Father    Alcohol abuse Father        Schizophrenia   Depression Brother    Mental illness Brother        Schizophrenia (2 brothers); schizophrenia and bipolar (1 brother)   Heart attack Brother    Schizophrenia Brother    Kidney failure Brother    Bipolar disorder Brother    Alcohol abuse Maternal Uncle    Alcohol abuse Paternal Uncle    Alzheimer's disease Maternal Grandfather    Cancer Maternal Grandmother        colon   Diabetes Maternal Grandmother    Heart attack Paternal Grandfather    Heart disease Paternal Grandfather     Diabetes Paternal Grandfather    Mental illness Paternal Grandmother    Polycystic ovary syndrome Daughter    Other Daughter        knee replacement   Thyroid disease Daughter        had radiation on thyroid    Social History: I have reviewed social history from progress note on 02/15/2022. Social History   Socioeconomic History   Marital status: Married    Spouse name: Not on file   Number of children: Not on file   Years of education: Not on file   Highest education level: Not on file  Occupational History   Not on file  Tobacco Use   Smoking status: Every Day    Current packs/day: 0.25    Average packs/day: 0.3 packs/day for 37.0 years (9.3 ttl pk-yrs)    Types: Cigarettes    Passive exposure: Current   Smokeless tobacco: Never   Tobacco comments:    smokes 5 cig daily- cutting back  Vaping Use   Vaping status: Never Used  Substance and Sexual Activity   Alcohol use: Not  Currently    Comment: Quit September 2019   Drug use: No   Sexual activity: Yes    Birth control/protection: Post-menopausal  Other Topics Concern   Not on file  Social History Narrative   Married for 49 years.Retired,used to work at [employer].   Social Drivers of Corporate investment banker Strain: Low Risk  (01/09/2020)   Overall Financial Resource Strain (CARDIA)    Difficulty of Paying Living Expenses: Not hard at all  Food Insecurity: No Food Insecurity (01/09/2020)   Hunger Vital Sign    Worried About Running Out of Food in the Last Year: Never true    Ran Out of Food in the Last Year: Never true  Transportation Needs: No Transportation Needs (01/09/2020)   PRAPARE - Administrator, Civil Service (Medical): No    Lack of Transportation (Non-Medical): No  Physical Activity: Sufficiently Active (01/09/2020)   Exercise Vital Sign    Days of Exercise per Week: 5 days    Minutes of Exercise per Session: 30 min  Stress: No Stress Concern Present (01/09/2020)   [employer] of Occupational Health - Occupational Stress Questionnaire    Feeling of Stress : Only a little  Social Connections: Socially Integrated (01/09/2020)   Social Connection and Isolation Panel [NHANES]    Frequency of Communication with Friends and Family: Once a week    Frequency of Social Gatherings with Friends and Family: More than three times a week    Attends Religious Services: More than 4 times per year    Active Member of Clubs or Organizations: Yes    Attends Engineer, structural: More than 4 times per year    Marital Status: Married    Allergies: No Known Allergies  Metabolic Disorder Labs: No results found for: "HGBA1C", "MPG" No results found for: "PROLACTIN" Lab Results  Component Value Date   CHOL 239 (H) 01/21/2021   TRIG 86 01/21/2021   HDL 65 01/21/2021   CHOLHDL 3.7 01/21/2021   LDLCALC 155 (H) 01/21/2021   LDLCALC 134 (H) 05/28/2020   Lab Results  Component Value Date   TSH 1.070 03/07/2022    Therapeutic Level Labs: No results found for: "LITHIUM" No results found for: "VALPROATE" No results found for: "CBMZ"  Current Medications: Current Outpatient Medications  Medication Sig Dispense Refill   colestipol (COLESTID) 1 g tablet Take 1 g by mouth 2 (two) times daily.     ezetimibe (ZETIA) 10 MG tablet Take 10 mg by mouth. One three times per week     famotidine (PEPCID) 20 MG tablet Take 20 mg by mouth. Takes as needed     FLUoxetine (PROZAC) 10 MG capsule Take 3 capsules (30 mg total) by mouth as directed. Take 2 tablets daily AM and 1 tablet PM 270 capsule 1   methocarbamol (ROBAXIN) 500 MG tablet Take 500 mg by mouth 2 (two) times daily.     prazosin (MINIPRESS) 1 MG capsule TAKE 1 CAPSULE AT BEDTIME 90 capsule 3   propranolol (INDERAL) 10 MG tablet Take 10 mg by mouth 2 (two) times daily as needed.     QUEtiapine (SEROQUEL) 50 MG tablet Take 1 tablet (50 mg total) by mouth at bedtime. 30 tablet 1   No current facility-administered  medications for this visit.     Musculoskeletal: Strength & Muscle Tone:  UTA Gait & Station:  Seated Patient leans: N/A  Psychiatric Specialty Exam: Review of Systems  Psychiatric/Behavioral:  Mood swings     There were no vitals taken for this visit.There is no height or weight on file to calculate BMI.  General Appearance: Fairly Groomed  Eye Contact:  Fair  Speech:  Clear and Coherent  Volume:  Normal  Mood:   mood swings - improving  Affect:  Congruent  Thought Process:  Goal Directed and Descriptions of Associations: Intact  Orientation:  Full (Time, Place, and Person)  Thought Content: Logical   Suicidal Thoughts:  No  Homicidal Thoughts:  No  Memory:  Immediate;   Fair Recent;   Fair Remote;   Fair  Judgement:  Fair  Insight:  Fair  Psychomotor Activity:  Normal  Concentration:  Concentration: Fair and Attention Span: Fair  Recall:  [employer] of Knowledge: Fair  Language: Fair  Akathisia:  No  Handed:  Right  AIMS (if indicated): not done  Assets:  Desire for Improvement Housing Social Support Transportation  ADL's:  Intact  Cognition: WNL  Sleep:  Fair   Screenings: Midwife Visit from 01/26/2023 in [facility] Office Visit from [facility]/11/2022 in [facility]  AIMS Total Score 0 0      GAD-7    Flowsheet Row Office Visit from 11/23/2023 in [facility] Video Visit from 03/28/2023 in [facility] Office Visit from 01/26/2023 in [facility] Office Visit from 12/21/2022 in [facility] Office Visit from 08/24/2022 in [facility]  Total GAD-7 Score 21 7 1 14 7       PHQ2-9    Flowsheet Row Office Visit from 11/23/2023 in [facility] Video Visit from 05/11/2023 in [facility] Office Visit from 01/26/2023 in [facility] Counselor from 12/26/2022 [facility] Office Visit from 12/21/2022 in [facility]  PHQ-2 Total Score 2 0 0 5 5  PHQ-9 Total Score 11 -- 3 18 20       Flowsheet Row Video Visit from 12/22/2023 in [facility] Office Visit from 11/23/2023 in [facility] ED from 10/23/2023 in [facility] [place]  C-SSRS RISK CATEGORY No Risk No Risk No Risk        Assessment and Plan: Morgan Fields is a 73 year old patient female who has a history of PTSD, MDD, GAD was evaluated by telemedicine today.  Discussed assessment and plan as noted below.  Bipolar Disorder Type II-improving Morgan Fields exhibits mood symptoms consistent with Bipolar Disorder Type II, including irritability, talkativeness, anger, increased activity, and depression. She reports significant improvement since initiating Seroquel 50 mg three weeks ago, with enhanced mood awareness and management, improved sleep, and no significant side effects. She prefers to maintain the current dosage unless symptoms worsen. - Continue Seroquel 50 mg daily. - Monitor mood symptoms and side effects. - Schedule follow-up appointment in 6-7 weeks.  PTSD-stable Currently managed on current medication regimen. - Continue Fluoxetine 30 mg daily - Continue Prazosin 1 mg at bedtime - Encourage CBT for PTSD management.  Generalized anxiety disorder-improving Currently reports anxiety symptoms as manageable.  Currently working on coping strategies and is able to be more aware of her symptoms which helps. - Continue Fluoxetine 30 mg daily - Continue Propranolol 10 mg  twice a day as needed  Follow-up - Follow-up in clinic in 6 to 7  weeks or sooner if needed.   Collaboration of Care: Collaboration of Care: Referral or follow-up with counselor/therapist AEB encouraged to continue CBT  Patient/Guardian was advised Release of Information must be obtained prior to any record release in order to collaborate their care with an outside provider. Patient/Guardian was advised if they have not already done so to contact the registration department to sign all necessary forms in order for [place] to release information regarding their care.   Consent: Patient/Guardian gives verbal consent for treatment and assignment of benefits for services provided during this visit. Patient/Guardian expressed understanding and agreed to proceed.  This note was generated in part or whole with voice recognition software. Voice recognition is usually quite accurate but there are transcription errors that can and very often do occur. I apologize for any typographical errors that were not detected and corrected.     Jomarie Longs, MD 12/22/2023, 4:31 PM

## 2024-01-16 ENCOUNTER — Other Ambulatory Visit: Payer: Self-pay | Admitting: Psychiatry

## 2024-01-16 DIAGNOSIS — F3181 Bipolar II disorder: Secondary | ICD-10-CM

## 2024-02-05 ENCOUNTER — Encounter: Payer: Self-pay | Admitting: Psychiatry

## 2024-02-05 ENCOUNTER — Telehealth: Admitting: Psychiatry

## 2024-02-05 DIAGNOSIS — F4310 Post-traumatic stress disorder, unspecified: Secondary | ICD-10-CM | POA: Diagnosis not present

## 2024-02-05 DIAGNOSIS — F411 Generalized anxiety disorder: Secondary | ICD-10-CM | POA: Diagnosis not present

## 2024-02-05 DIAGNOSIS — F17200 Nicotine dependence, unspecified, uncomplicated: Secondary | ICD-10-CM

## 2024-02-05 DIAGNOSIS — F3181 Bipolar II disorder: Secondary | ICD-10-CM | POA: Diagnosis not present

## 2024-02-05 MED ORDER — LAMOTRIGINE 25 MG PO TABS
ORAL_TABLET | ORAL | 0 refills | Status: DC
Start: 2024-02-05 — End: 2024-02-26

## 2024-02-05 MED ORDER — QUETIAPINE FUMARATE 25 MG PO TABS
37.5000 mg | ORAL_TABLET | Freq: Every day | ORAL | Status: DC
Start: 2024-02-05 — End: 2024-07-03

## 2024-02-05 NOTE — Patient Instructions (Signed)
 Lamotrigine Tablets What is this medication? LAMOTRIGINE (la MOE Patrecia Pace) prevents and controls seizures in people with epilepsy. It may also be used to treat bipolar disorder. It works by calming overactive nerves in your body. This medicine may be used for other purposes; ask your health care provider or pharmacist if you have questions. COMMON BRAND NAME(S): Lamictal, Subvenite What should I tell my care team before I take this medication? They need to know if you have any of these conditions: Heart disease History of irregular heartbeat Immune system problems Kidney disease Liver disease Low levels of folic acid in the blood Lupus Mental health condition Suicidal thoughts, plans, or attempt by you or a family member An unusual or allergic reaction to lamotrigine, other medications, foods, dyes, or preservatives Pregnant or trying to get pregnant Breastfeeding How should I use this medication? Take this medication by mouth with a glass of water. Follow the directions on the prescription label. Do not chew these tablets. If this medication upsets your stomach, take it with food or milk. Take your doses at regular intervals. Do not take your medication more often than directed. A special MedGuide will be given to you by the pharmacist with each new prescription and refill. Be sure to read this information carefully each time. Talk to your care team about the use of this medication in children. While this medication may be prescribed for children as young as 2 years for selected conditions, precautions do apply. Overdosage: If you think you have taken too much of this medicine contact a poison control center or emergency room at once. NOTE: This medicine is only for you. Do not share this medicine with others. What if I miss a dose? If you miss a dose, take it as soon as you can. If it is almost time for your next dose, take only that dose. Do not take double or extra doses. What may  interact with this medication? Atazanavir Certain medications for irregular heartbeat Certain medications for seizures, such as carbamazepine, phenobarbital, phenytoin, primidone, or valproic acid Estrogen or progestin hormones Lopinavir Rifampin Ritonavir This list may not describe all possible interactions. Give your health care provider a list of all the medicines, herbs, non-prescription drugs, or dietary supplements you use. Also tell them if you smoke, drink alcohol, or use illegal drugs. Some items may interact with your medicine. What should I watch for while using this medication? Visit your care team for regular checks on your progress. If you take this medication for seizures, wear a Medic Alert bracelet or necklace. Carry an identification card with information about your condition, medications, and care team. It is important to take this medication exactly as directed. When first starting treatment, your dose will need to be adjusted slowly. It may take weeks or months before your dose is stable. You should contact your care team if your seizures get worse or if you have any new types of seizures. Do not stop taking this medication unless instructed by your care team. Stopping your medication suddenly can increase your seizures or their severity. This medication may cause serious skin reactions. They can happen weeks to months after starting the medication. Contact your care team right away if you notice fevers or flu-like symptoms with a rash. The rash may be red or purple and then turn into blisters or peeling of the skin. You may also notice a red rash with swelling of the face, lips, or lymph nodes in your neck or under your  arms. This medication may affect your coordination, reaction time, or judgment. Do not drive or operate machinery until you know how this medication affects you. Sit up or stand slowly to reduce the risk of dizzy or fainting spells. Drinking alcohol with this  medication can increase the risk of these side effects. If you are taking this medication for bipolar disorder, it is important to report any changes in your mood to your care team. If your condition gets worse, you get mentally depressed, feel very hyperactive or manic, have difficulty sleeping, or have thoughts of hurting yourself or committing suicide, you need to get help from your care team right away. If you are a caregiver for someone taking this medication for bipolar disorder, you should also report these behavioral changes right away. The use of this medication may increase the chance of suicidal thoughts or actions. Pay special attention to how you are responding while on this medication. Your mouth may get dry. Chewing sugarless gum or sucking hard candy and drinking plenty of water may help. Contact your care team if the problem does not go away or is severe. If you become pregnant while using this medication, you may enroll in the [place] American Antiepileptic Drug Pregnancy Registry by calling [phone]. This registry collects information about the safety of antiepileptic medication use during pregnancy. This medication may cause a decrease in folic acid. You should make sure that you get enough folic acid while you are taking this medication. Discuss the foods you eat and the vitamins you take with your care team. What side effects may I notice from receiving this medication? Side effects that you should report to your care team as soon as possible: Allergic reactions--skin rash, itching, hives, swelling of the face, lips, tongue, or throat Change in vision Fever, neck pain or stiffness, sensitivity to light, headache, nausea, vomiting, confusion, which may be signs of meningitis Fever, rash, swollen lymph nodes, confusion, trouble walking, loss of balance or coordination, seizures Heart rhythm changes--fast or irregular heartbeat, dizziness, feeling faint or lightheaded, chest pain,  trouble breathing Infection--fever, chills, cough, or sore throat Low red blood cell level--unusual weakness or fatigue, dizziness, headache, trouble breathing Rash, fever, and swollen lymph nodes Redness, blistering, peeling, or loosening of the skin, including inside the mouth Thoughts of suicide or self-harm, worsening mood, feelings of depression Unusual bruising or bleeding Side effects that usually do not require medical attention (report these to your care team if they continue or are bothersome): Diarrhea Dizziness Drowsiness Headache Nausea Stomach pain Tremors or shaking This list may not describe all possible side effects. Call your doctor for medical advice about side effects. You may report side effects to FDA at 1-800-FDA-1088. Where should I keep my medication? Keep out of the reach of children and pets. Store at [employer] C (77 degrees F). Protect from light. Get rid of any unused medication after the expiration date. To get rid of medications that are no longer needed or have expired: Take the medication to a medication take-back program. Check with your pharmacy or law enforcement to find a location. If you cannot return the medication, check the label or package insert to see if the medication should be thrown out in the garbage or flushed down the toilet. If you are not sure, ask your care team. If it is safe to put it in the trash, empty the medication out of the container. Mix the medication with cat litter, dirt, coffee grounds, or other unwanted substance.  Seal the mixture in a bag or container. Put it in the trash. NOTE: This sheet is a summary. It may not cover all possible information. If you have questions about this medicine, talk to your doctor, pharmacist, or health care provider.  2024 Elsevier/Gold Standard (2023-08-18 00:00:00)

## 2024-02-05 NOTE — Progress Notes (Signed)
 Virtual Visit via Video Note  I connected with Morgan Fields on 02/05/24 at  4:00 PM EDT by a video enabled telemedicine application and verified that I am speaking with the correct person using two identifiers.  Location Provider Location : [facility] Patient Location : Home  Participants: Patient , Provider    I discussed the limitations of evaluation and management by telemedicine and the availability of in person appointments. The patient expressed understanding and agreed to proceed.   I discussed the assessment and treatment plan with the patient. The patient was provided an opportunity to ask questions and all were answered. The patient agreed with the plan and demonstrated an understanding of the instructions.   The patient was advised to call back or seek an in-person evaluation if the symptoms worsen or if the condition fails to improve as anticipated.   BH MD OP Progress Note  02/05/2024 4:14 PM Morgan Fields  MRN:  [id number]  Chief Complaint:  Chief Complaint  Patient presents with   Follow-up   Depression   Anxiety   Manic Behavior   Medication Refill   Discussed the use of AI scribe software for clinical note transcription with the patient, who gave verbal consent to proceed.  History of Present Illness Morgan Fields is a 73 year old Caucasian female retired, lives in [place], has a history of PTSD, GAD, bipolar disorder, tobacco use disorder, arthritis, osteoporosis, IBS was evaluated by telemedicine today.  Over the past month, she has experienced significant mood instability. Initially, an increase in her medication dosage of Seroquel  improved her symptoms, reducing 'chattering' and restlessness, and allowing her to relax. However, around April 22, she began experiencing sadness, which progressed to a 'dark depression' by the end of the week, described as being in a 'dark place' without any external cause.  On Friday and Saturday, she felt  significantly down, going to bed as early as 6:30 PM. She attempted to adjust her medication by reducing her Seroquel  dose from 50 mg to 25 mg, which resulted in feeling happier the following day. Despite this, she has been experiencing increased agitation and restlessness, engaging in activities like painting to manage these feelings. She is concerned about the severity of her depression and the impact of her mood swings on her daily life.  She is currently taking Prozac  30 mg, with two tablets in the morning and one in the afternoon, but it does not alleviate her depressive symptoms. She also takes Minipress  at night for nightmares and propranolol  as needed, though she finds propranolol  increases her anxiety if she takes too much.  Denies active suicidal thoughts, but she mentions having aggressive dreams when feeling very down.  She denies any homicidality or perceptual disturbances.    Visit Diagnosis:    ICD-10-CM   1. PTSD (post-traumatic stress disorder)  F43.10 lamoTRIgine  (LAMICTAL ) 25 MG tablet    2. GAD (generalized anxiety disorder)  F41.1 lamoTRIgine  (LAMICTAL ) 25 MG tablet    3. Bipolar II disorder, most recent episode hypomanic (HCC)  F31.81 QUEtiapine  (SEROQUEL ) 25 MG tablet    lamoTRIgine  (LAMICTAL ) 25 MG tablet   mixed features ,moderate    4. Tobacco use disorder  F17.200       Past Psychiatric History: I have reviewed past psychiatric history from progress note on 02/15/2022.  Past trials of medications like Lexapro , hydroxyzine , Cymbalta , olanzapine.  Past Medical History:  Past Medical History:  Diagnosis Date   Complication of anesthesia    takes a  long time to wake up from anesthesia   Depression    Hiatal hernia    HLD (hyperlipidemia) 07/22/2019   IBS (irritable bowel syndrome)    Osteoporosis 07/22/2019   PMB (postmenopausal bleeding) 03/11/2016   Post-menopause on HRT (hormone replacement therapy) 03/11/2016   Skin cancer    Recent to anterior chest;  cervical cancer   Thickened endometrium 03/16/2016   Vaginal Pap smear, abnormal    Vitamin D  deficiency disease 07/22/2019    Past Surgical History:  Procedure Laterality Date   BIOPSY  11/08/2022   Procedure: BIOPSY;  Surgeon: Urban Garden, MD;  [facility];  Service: Gastroenterology;;   BIOPSY  12/14/2022   Procedure: BIOPSY;  Surgeon: Urban Garden, MD;  [facility];  Service: Gastroenterology;;   BREAST ENHANCEMENT SURGERY     CERVICAL BIOPSY     precancerous years ago   CHOLECYSTECTOMY  2011   COLONOSCOPY N/A 05/08/2013   Procedure: COLONOSCOPY;  Surgeon: Ruby Corporal, MD;  [facility]: [facility];  Service: Endoscopy;  Laterality: N/A;  200   COLONOSCOPY WITH PROPOFOL  N/A 12/14/2022   Procedure: COLONOSCOPY WITH PROPOFOL ;  Surgeon: Urban Garden, MD;  [facility];  Service: Gastroenterology;  Laterality: N/A;  9:30am;ASA 2   ESOPHAGOGASTRODUODENOSCOPY (EGD) WITH PROPOFOL  N/A 11/08/2022   Procedure: ESOPHAGOGASTRODUODENOSCOPY (EGD) WITH PROPOFOL ;  Surgeon: Urban Garden, MD;  [facility];  Service: Gastroenterology;  Laterality: N/A;  10:15 AM   HYSTEROSCOPY WITH D & C N/A 10/05/2016   Procedure: HYSTEROSCOPY; UTERINE CURETTAGE;  Surgeon: Wendelyn Halter, MD;  Location: [facility];  Service: Gynecology;  Laterality: N/A;   POLYPECTOMY N/A 10/05/2016   Procedure: REMOVAL OF ENDOMETRIAL POLYP;  Surgeon: Wendelyn Halter, MD;  Location: [facility];  Service: Gynecology;  Laterality: N/A;   TUBAL LIGATION      Family Psychiatric History: I have reviewed family psychiatric history from progress note on 02/15/2022.  Family History:  Family History  Problem Relation Age of Onset   Dementia Mother    Hyperlipidemia Mother    Thyroid  disease Mother    Hypertension Mother    Schizophrenia Father    Alcohol abuse Father        Schizophrenia   Depression Brother    Mental illness Brother         Schizophrenia (2 brothers); schizophrenia and bipolar (1 brother)   Heart attack Brother    Schizophrenia Brother    Kidney failure Brother    Bipolar disorder Brother    Alcohol abuse Maternal Uncle    Alcohol abuse Paternal Uncle    Alzheimer's disease Maternal Grandfather    Cancer Maternal Grandmother        colon   Diabetes Maternal Grandmother    Heart attack Paternal Grandfather    Heart disease Paternal Grandfather    Diabetes Paternal Grandfather    Mental illness Paternal Grandmother    Polycystic ovary syndrome Daughter    Other Daughter        knee replacement   Thyroid  disease Daughter        had radiation on thyroid     Social History: I have reviewed social history from progress note on 02/15/2022. Social History   Socioeconomic History   Marital status: Married    Spouse name: Not on file   Number of children: Not on file   Years of education: Not on file   Highest education level: Not on file  Occupational  History   Not on file  Tobacco Use   Smoking status: Every Day    Current packs/day: 0.25    Average packs/day: 0.3 packs/day for 37.0 years (9.3 ttl pk-yrs)    Types: Cigarettes    Passive exposure: Current   Smokeless tobacco: Never   Tobacco comments:    smokes 5 cig daily- cutting back  Vaping Use   Vaping status: Never Used  Substance and Sexual Activity   Alcohol use: Not Currently    Comment: Quit September 2019   Drug use: No   Sexual activity: Yes    Birth control/protection: Post-menopausal  Other Topics Concern   Not on file  Social History Narrative   Married for 49 years.Retired,used to work at [employer].   Social Drivers of Corporate investment banker Strain: Low Risk  (01/09/2020)   Overall Financial Resource Strain (CARDIA)    Difficulty of Paying Living Expenses: Not hard at all  Food Insecurity: No Food Insecurity (01/09/2020)   Hunger Vital Sign    Worried About Running Out of Food in the Last Year: Never true     Ran Out of Food in the Last Year: Never true  Transportation Needs: No Transportation Needs (01/09/2020)   PRAPARE - Administrator, Civil Service (Medical): No    Lack of Transportation (Non-Medical): No  Physical Activity: Sufficiently Active (01/09/2020)   Exercise Vital Sign    Days of Exercise per Week: 5 days    Minutes of Exercise per Session: 30 min  Stress: No Stress Concern Present (01/09/2020)   [employer] of Occupational Health - Occupational Stress Questionnaire    Feeling of Stress : Only a little  Social Connections: Socially Integrated (01/09/2020)   Social Connection and Isolation Panel [NHANES]    Frequency of Communication with Friends and Family: Once a week    Frequency of Social Gatherings with Friends and Family: More than three times a week    Attends Religious Services: More than 4 times per year    Active Member of Clubs or Organizations: Yes    Attends Engineer, structural: More than 4 times per year    Marital Status: Married    Allergies: No Known Allergies  Metabolic Disorder Labs: No results found for: "HGBA1C", "MPG" No results found for: "PROLACTIN" Lab Results  Component Value Date   CHOL 239 (H) 01/21/2021   TRIG 86 01/21/2021   HDL 65 01/21/2021   CHOLHDL 3.7 01/21/2021   LDLCALC 155 (H) 01/21/2021   LDLCALC 134 (H) 05/28/2020   Lab Results  Component Value Date   TSH 1.070 03/07/2022    Therapeutic Level Labs: No results found for: "LITHIUM" No results found for: "VALPROATE" No results found for: "CBMZ"  Current Medications: Current Outpatient Medications  Medication Sig Dispense Refill   lamoTRIgine  (LAMICTAL ) 25 MG tablet Take 1 tablet (25 mg total) by mouth daily for 15 days, THEN 1 tablet (25 mg total) 2 (two) times daily for 15 days. 45 tablet 0   QUEtiapine  (SEROQUEL ) 25 MG tablet Take 1.5 tablets (37.5 mg total) by mouth at bedtime.     colestipol  (COLESTID ) 1 g tablet Take 1 g by mouth 2 (two)  times daily.     ezetimibe (ZETIA) 10 MG tablet Take 10 mg by mouth. One three times per week     famotidine  (PEPCID ) 20 MG tablet Take 20 mg by mouth. Takes as needed     FLUoxetine  (PROZAC ) 10 MG  capsule Take 3 capsules (30 mg total) by mouth as directed. Take 2 tablets daily AM and 1 tablet PM 270 capsule 1   methocarbamol (ROBAXIN) 500 MG tablet Take 500 mg by mouth 2 (two) times daily.     prazosin  (MINIPRESS ) 1 MG capsule TAKE 1 CAPSULE AT BEDTIME 90 capsule 3   propranolol  (INDERAL ) 10 MG tablet Take 10 mg by mouth 2 (two) times daily as needed.     No current facility-administered medications for this visit.     Musculoskeletal: Strength & Muscle Tone: UTA Gait & Station: Seated Patient leans: N/A  Psychiatric Specialty Exam: Review of Systems  Psychiatric/Behavioral:  Positive for dysphoric mood.     There were no vitals taken for this visit.There is no height or weight on file to calculate BMI.  General Appearance: Casual  Eye Contact:  Fair  Speech:  Clear and Coherent  Volume:  Normal  Mood:  Dysphoric, mood swings  Affect:  Congruent  Thought Process:  Goal Directed and Descriptions of Associations: Intact  Orientation:  Full (Time, Place, and Person)  Thought Content: Logical   Suicidal Thoughts:  No  Homicidal Thoughts:  No  Memory:  Immediate;   Fair Recent;   Fair Remote;   Fair  Judgement:  Fair  Insight:  Fair  Psychomotor Activity:  Normal  Concentration:  Concentration: Fair and Attention Span: Fair  Recall:  [employer] of Knowledge: Fair  Language: Fair  Akathisia:  No  Handed:  Right  AIMS (if indicated): not done  Assets:  Desire for Improvement Housing Social Support Transportation  ADL's:  Intact  Cognition: WNL  Sleep:  Fair   Screenings: Midwife Visit from 01/26/2023 in [facility] Office Visit from 05/11/2022 in [facility]  AIMS  Total Score 0 0      GAD-7    Flowsheet Row Office Visit from 11/23/2023 in [facility] Video Visit from 03/28/2023 in [facility] Office Visit from 01/26/2023 in [facility] Office Visit from 12/21/2022 in [facility] Office Visit from 08/24/2022 in [facility]  Total GAD-7 Score 21 7 1 14 7       PHQ2-9    Flowsheet Row Office Visit from 11/23/2023 in [facility] Video Visit from 05/11/2023 in [facility] Office Visit from 01/26/2023 in [facility] Counselor from 12/26/2022 [facility] Office Visit from 12/21/2022 in [facility]  PHQ-2 Total Score 2 0 0 5 5  PHQ-9 Total Score 11 -- 3 18 20       Flowsheet Row Video Visit from 02/05/2024 in [facility] Video Visit from 12/22/2023 in [facility] Office Visit from 11/23/2023 in [facility]  C-SSRS RISK CATEGORY No Risk No Risk No Risk        Assessment and Plan: Morgan Fields is a 73 year old Caucasian female who has a history of PTSD, MDD, GAD was evaluated by telemedicine today.  Discussed assessment and plan as noted below.  Bipolar disorder type II-hypomanic episode with mixed features moderate-unstable Really currently exhibits mood swings, dysphoria as well as hypomanic symptoms.  Dosage reduction of Seroquel  made her hypomanic.  Seroquel  50  mg caused excessive sleepiness. - Change Seroquel  to 37.5 mg daily at night. - Start Lamotrigine  25 mg daily for 2 weeks and increase to 25 mg twice a day after that.  PTSD-stable Currently denies any  significant trauma related symptoms. - Continue Fluoxetine  30 mg daily - Continue Prazosin  1 mg at bedtime  Generalized anxiety disorder-unstable Recent changes in mood symptoms has worsened anxiety as well. - Add Lamictal  as noted above. - Continue Propranolol  10 mg twice a day as needed, limit use. - Continue Fluoxetine  30 mg daily  Follow-up Follow-up in clinic in 2 to 3 weeks or sooner if needed.   Consent: Patient/Guardian gives verbal consent for treatment and assignment of benefits for services provided during this visit. Patient/Guardian expressed understanding and agreed to proceed.   This note was generated in part or whole with voice recognition software. Voice recognition is usually quite accurate but there are transcription errors that can and very often do occur. I apologize for any typographical errors that were not detected and corrected.    Iliana Hutt, MD 02/06/2024, 10:02 AM

## 2024-02-14 ENCOUNTER — Other Ambulatory Visit: Payer: Self-pay | Admitting: Psychiatry

## 2024-02-14 DIAGNOSIS — F3181 Bipolar II disorder: Secondary | ICD-10-CM

## 2024-02-25 NOTE — Progress Notes (Signed)
 Virtual Visit via Video Note  I connected with Morgan Fields on 02/26/24 at  3:30 PM EDT by a video enabled telemedicine application and verified that I am speaking with the correct person using two identifiers.  Location Provider Location : [facility] Patient Location : Home  Participants: Patient , Provider    I discussed the limitations of evaluation and management by telemedicine and the availability of in person appointments. The patient expressed understanding and agreed to proceed.   I discussed the assessment and treatment plan with the patient. The patient was provided an opportunity to ask questions and all were answered. The patient agreed with the plan and demonstrated an understanding of the instructions.   The patient was advised to call back or seek an in-person evaluation if the symptoms worsen or if the condition fails to improve as anticipated.  [facility] MD OP Progress Note  02/26/2024 3:48 PM WILHELMINA HARK  MRN:  [id number]  Chief Complaint:  Chief Complaint  Patient presents with   Medication Refill   Follow-up   mood swings    Anxiety   Discussed the use of AI scribe software for clinical note transcription with the patient, who gave verbal consent to proceed.  History of Present Illness EVANGELINA Fields is a 73 year old Caucasian female, retired, lives in [place], has a history of PTSD, GAD, bipolar disorder, tobacco use disorder, arthritis, osteoporosis, IBS was evaluated by telemedicine today.  She feels more balanced following recent medication changes. Initially, she experienced increased anxiety for the first two days after starting and increasing the dose of Lamictal , but these symptoms resolved quickly. Reducing Seroquel  has helped her maintain a more stable mood, avoiding extreme highs and lows.  Her sleep has improved significantly. She falls asleep quickly at night and sleeps well, waking up between 5 and 6 AM. Although she attempts to nap during  the day, she experiences 'chatter' that makes it difficult to rest fully, but she is more rational about it now.  She has gained about three pounds and feels healthier overall.  Her appetite has improved.  She attributes this to feeling more balanced and less 'wired', allowing her to relax more and not be as obsessive about activities.  Her current medications include Lamictal  25 mg twice daily, Prozac  three capsules daily (two in the morning, one at night), prazosin  1 mg at bedtime, propranolol  10 mg twice daily as needed, and Seroquel  37.5 mg daily. She rarely uses propranolol  and is satisfied with her current medication regimen.  Denies suicidal thoughts, thoughts of harming others, or paranoia. Previous violent dreams have subsided.    Visit Diagnosis:    ICD-10-CM   1. PTSD (post-traumatic stress disorder)  F43.10 lamoTRIgine  (LAMICTAL ) 25 MG tablet    2. GAD (generalized anxiety disorder)  F41.1 lamoTRIgine  (LAMICTAL ) 25 MG tablet    3. Bipolar disorder, in partial remission, most recent episode mixed (HCC)  F31.77 lamoTRIgine  (LAMICTAL ) 25 MG tablet   type II    4. Tobacco use disorder  F17.200       Past Psychiatric History: I reviewed past psychiatric history from progress note on 02/15/2022.  Past trials of medications like Lexapro , hydroxyzine , Cymbalta , olanzapine.  Past Medical History:  Past Medical History:  Diagnosis Date   Complication of anesthesia    takes a long time to wake up from anesthesia   Depression    Hiatal hernia    HLD (hyperlipidemia) 07/22/2019   IBS (irritable bowel syndrome)  Osteoporosis 07/22/2019   PMB (postmenopausal bleeding) 03/11/2016   Post-menopause on HRT (hormone replacement therapy) 03/11/2016   Skin cancer    Recent to anterior chest; cervical cancer   Thickened endometrium 03/16/2016   Vaginal Pap smear, abnormal    Vitamin D  deficiency disease 07/22/2019    Past Surgical History:  Procedure Laterality Date   BIOPSY   11/08/2022   Procedure: BIOPSY;  Surgeon: Eartha Angelia Sieving, MD;  [facility];  Service: Gastroenterology;;   BIOPSY  12/14/2022   Procedure: BIOPSY;  Surgeon: Eartha Angelia Sieving, MD;  [facility];  Service: Gastroenterology;;   BREAST ENHANCEMENT SURGERY     CERVICAL BIOPSY     precancerous years ago   CHOLECYSTECTOMY  2011   COLONOSCOPY N/A 05/08/2013   Procedure: COLONOSCOPY;  Surgeon: Claudis RAYMOND Rivet, MD;  [facility]: [facility];  Service: Endoscopy;  Laterality: N/A;  200   COLONOSCOPY WITH PROPOFOL  N/A 12/14/2022   Procedure: COLONOSCOPY WITH PROPOFOL ;  Surgeon: Eartha Angelia Sieving, MD;  [facility];  Service: Gastroenterology;  Laterality: N/A;  9:30am;ASA 2   ESOPHAGOGASTRODUODENOSCOPY (EGD) WITH PROPOFOL  N/A 11/08/2022   Procedure: ESOPHAGOGASTRODUODENOSCOPY (EGD) WITH PROPOFOL ;  Surgeon: Eartha Angelia Sieving, MD;  [facility];  Service: Gastroenterology;  Laterality: N/A;  10:15 AM   HYSTEROSCOPY WITH D & C N/A 10/05/2016   Procedure: HYSTEROSCOPY; UTERINE CURETTAGE;  Surgeon: Vonn VEAR Inch, MD;  Location: [facility];  Service: Gynecology;  Laterality: N/A;   POLYPECTOMY N/A 10/05/2016   Procedure: REMOVAL OF ENDOMETRIAL POLYP;  Surgeon: Vonn VEAR Inch, MD;  Location: [facility];  Service: Gynecology;  Laterality: N/A;   TUBAL LIGATION      Family Psychiatric History: I have reviewed family psychiatric history from progress note on 02/15/2022.  Family History:  Family History  Problem Relation Age of Onset   Dementia Mother    Hyperlipidemia Mother    Thyroid  disease Mother    Hypertension Mother    Schizophrenia Father    Alcohol abuse Father        Schizophrenia   Depression Brother    Mental illness Brother        Schizophrenia (2 brothers); schizophrenia and bipolar (1 brother)   Heart attack Brother    Schizophrenia Brother    Kidney failure Brother    Bipolar disorder Brother    Alcohol abuse Maternal  Uncle    Alcohol abuse Paternal Uncle    Alzheimer's disease Maternal Grandfather    Cancer Maternal Grandmother        colon   Diabetes Maternal Grandmother    Heart attack Paternal Grandfather    Heart disease Paternal Grandfather    Diabetes Paternal Grandfather    Mental illness Paternal Grandmother    Polycystic ovary syndrome Daughter    Other Daughter        knee replacement   Thyroid  disease Daughter        had radiation on thyroid     Social History: I have reviewed social history from progress note on 02/15/2022. Social History   Socioeconomic History   Marital status: Married    Spouse name: Not on file   Number of children: Not on file   Years of education: Not on file   Highest education level: Not on file  Occupational History   Not on file  Tobacco Use   Smoking status: Every Day    Current packs/day: 0.25    Average packs/day: 0.3 packs/day for 37.0  years (9.3 ttl pk-yrs)    Types: Cigarettes    Passive exposure: Current   Smokeless tobacco: Never   Tobacco comments:    smokes 5 cig daily- cutting back  Vaping Use   Vaping status: Never Used  Substance and Sexual Activity   Alcohol use: Not Currently    Comment: Quit September 2019   Drug use: No   Sexual activity: Yes    Birth control/protection: Post-menopausal  Other Topics Concern   Not on file  Social History Narrative   Married for 49 years.Retired,used to work at [employer].   Social Drivers of Corporate Investment Banker Strain: Low Risk  (01/09/2020)   Overall Financial Resource Strain (CARDIA)    Difficulty of Paying Living Expenses: Not hard at all  Food Insecurity: No Food Insecurity (01/09/2020)   Hunger Vital Sign    Worried About Running Out of Food in the Last Year: Never true    Ran Out of Food in the Last Year: Never true  Transportation Needs: No Transportation Needs (01/09/2020)   PRAPARE - Administrator, Civil Service (Medical): No    Lack of  Transportation (Non-Medical): No  Physical Activity: Sufficiently Active (01/09/2020)   Exercise Vital Sign    Days of Exercise per Week: 5 days    Minutes of Exercise per Session: 30 min  Stress: No Stress Concern Present (01/09/2020)   [employer] of Occupational Health - Occupational Stress Questionnaire    Feeling of Stress : Only a little  Social Connections: Socially Integrated (01/09/2020)   Social Connection and Isolation Panel [NHANES]    Frequency of Communication with Friends and Family: Once a week    Frequency of Social Gatherings with Friends and Family: More than three times a week    Attends Religious Services: More than 4 times per year    Active Member of Clubs or Organizations: Yes    Attends Engineer, Structural: More than 4 times per year    Marital Status: Married    Allergies: No Known Allergies  Metabolic Disorder Labs: No results found for: HGBA1C, MPG No results found for: PROLACTIN Lab Results  Component Value Date   CHOL 239 (H) 01/21/2021   TRIG 86 01/21/2021   HDL 65 01/21/2021   CHOLHDL 3.7 01/21/2021   LDLCALC 155 (H) 01/21/2021   LDLCALC 134 (H) 05/28/2020   Lab Results  Component Value Date   TSH 1.070 03/07/2022    Therapeutic Level Labs: No results found for: LITHIUM No results found for: VALPROATE No results found for: CBMZ  Current Medications: Current Outpatient Medications  Medication Sig Dispense Refill   lamoTRIgine  (LAMICTAL ) 25 MG tablet Take 1 tablet (25 mg total) by mouth 2 (two) times daily. 60 tablet 1   colestipol  (COLESTID ) 1 g tablet Take 1 g by mouth 2 (two) times daily.     ezetimibe (ZETIA) 10 MG tablet Take 10 mg by mouth. One three times per week     famotidine  (PEPCID ) 20 MG tablet Take 20 mg by mouth. Takes as needed     FLUoxetine  (PROZAC ) 10 MG capsule Take 3 capsules (30 mg total) by mouth as directed. Take 2 tablets daily AM and 1 tablet PM 270 capsule 1   methocarbamol (ROBAXIN)  500 MG tablet Take 500 mg by mouth 2 (two) times daily.     prazosin  (MINIPRESS ) 1 MG capsule TAKE 1 CAPSULE AT BEDTIME 90 capsule 3   propranolol  (INDERAL ) 10 MG  tablet Take 10 mg by mouth 2 (two) times daily as needed.     QUEtiapine  (SEROQUEL ) 25 MG tablet Take 1.5 tablets (37.5 mg total) by mouth at bedtime.     No current facility-administered medications for this visit.     Musculoskeletal: Strength & Muscle Tone: UTA Gait & Station: Seated Patient leans: N/A  Psychiatric Specialty Exam: Review of Systems  Psychiatric/Behavioral:         Mood swings - improving    There were no vitals taken for this visit.There is no height or weight on file to calculate BMI.  General Appearance: Casual  Eye Contact:  Fair  Speech:  Clear and Coherent  Volume:  Normal  Mood:  mood swings - improving  Affect:  Appropriate  Thought Process:  Goal Directed and Descriptions of Associations: Intact  Orientation:  Full (Time, Place, and Person)  Thought Content: Logical   Suicidal Thoughts:  No  Homicidal Thoughts:  No  Memory:  Immediate;   Fair Recent;   Fair Remote;   Fair  Judgement:  Fair  Insight:  Fair  Psychomotor Activity:  Normal  Concentration:  Concentration: Fair and Attention Span: Fair  Recall:  [employer] of Knowledge: Fair  Language: Fair  Akathisia:  No  Handed:  Right  AIMS (if indicated): not done  Assets:  Communication Skills Desire for Improvement Housing Social Support Talents/Skills Transportation  ADL's:  Intact  Cognition: WNL  Sleep:  Fair   Screenings: Midwife Visit from 01/26/2023 in [facility] Office Visit from 05/11/2022 in [facility]  AIMS Total Score 0 0      GAD-7    Flowsheet Row Office Visit from 11/23/2023 in [facility] Video Visit from 03/28/2023 in [facility] Office Visit from 01/26/2023 in [facility] Office Visit from 12/21/2022 in [facility] Office Visit from 08/24/2022 in [facility]  Total GAD-7 Score 21 7 1 14 7       PHQ2-9    Flowsheet Row [facility] Visit from [facility]/23/[facility] in [facility] Video Visit from 05/11/2023 in [facility] Office Visit from 01/26/2023 in [facility] Counselor from 12/26/2022 [facility] Office Visit from 12/21/2022 in [facility]  PHQ-2 Total Score 2 0 0 5 5  PHQ-9 Total Score 11 -- 3 18 20       Flowsheet Row Video Visit from 02/26/2024 in [facility] Video Visit from 02/05/2024 in [facility] Video Visit from 12/22/2023 in [facility]  C-SSRS RISK CATEGORY No Risk No Risk No Risk        Assessment and Plan: CATHI HAZAN is a 73 year old Caucasian female who has a history of PTSD, MDD, GAD was evaluated by telemedicine today.  Discussed assessment and plan as noted below.  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  Tolerating the higher dosage of Lamictal . Continue Seroquel  37.5 mg daily at night(reduced dosage due to excessive sedation on higher dosage) Continue Lamictal  25 mg twice daily.  Posttraumatic stress disorder-stable Currently denies any trauma related symptoms including intrusive memories, flashbacks  or nightmares.  Violent dreams have resolved. Continue Fluoxetine  30 mg daily Continue Prazosin  1 mg at bedtime  Generalized anxiety  disorder-improving Currently denies any significant anxiety and reports tolerating the current medication regimen. Continue Propranolol  10 mg twice a day as needed. Continue Fluoxetine  30 mg daily. Interested in reestablishing care with therapist, agrees to call the clinic for the same.  Follow-up Follow-up in clinic in 3 to 4 weeks or sooner if needed.   Collaboration of Care: Collaboration of Care: Referral or follow-up with counselor/therapist AEB encouraged to establish care with therapist.  Patient/Guardian was advised Release of Information must be obtained prior to any record release in order to collaborate their care with an outside provider. Patient/Guardian was advised if they have not already done so to contact the registration department to sign all necessary forms in order for us  to release information regarding their care.   Consent: Patient/Guardian gives verbal consent for treatment and assignment of benefits for services provided during this visit. Patient/Guardian expressed understanding and agreed to proceed.   This note was generated in part or whole with voice recognition software. Voice recognition is usually quite accurate but there are transcription errors that can and very often do occur. I apologize for any typographical errors that were not detected and corrected.    Justise Ehmann, MD 02/27/2024, 8:34 AM

## 2024-02-26 ENCOUNTER — Telehealth (INDEPENDENT_AMBULATORY_CARE_PROVIDER_SITE_OTHER): Admitting: Psychiatry

## 2024-02-26 ENCOUNTER — Encounter: Payer: Self-pay | Admitting: Psychiatry

## 2024-02-26 DIAGNOSIS — F4310 Post-traumatic stress disorder, unspecified: Secondary | ICD-10-CM

## 2024-02-26 DIAGNOSIS — F3177 Bipolar disorder, in partial remission, most recent episode mixed: Secondary | ICD-10-CM

## 2024-02-26 DIAGNOSIS — F411 Generalized anxiety disorder: Secondary | ICD-10-CM

## 2024-02-26 DIAGNOSIS — F17200 Nicotine dependence, unspecified, uncomplicated: Secondary | ICD-10-CM

## 2024-02-26 MED ORDER — LAMOTRIGINE 25 MG PO TABS
25.0000 mg | ORAL_TABLET | Freq: Two times a day (BID) | ORAL | 1 refills | Status: DC
Start: 2024-02-26 — End: 2024-04-26

## 2024-03-18 ENCOUNTER — Other Ambulatory Visit (HOSPITAL_COMMUNITY): Payer: Self-pay | Admitting: Adult Health

## 2024-03-18 ENCOUNTER — Other Ambulatory Visit (HOSPITAL_COMMUNITY): Payer: Self-pay | Admitting: Obstetrics & Gynecology

## 2024-03-18 ENCOUNTER — Encounter (HOSPITAL_COMMUNITY): Payer: Self-pay | Admitting: Adult Health

## 2024-03-18 DIAGNOSIS — Z13820 Encounter for screening for osteoporosis: Secondary | ICD-10-CM

## 2024-03-18 DIAGNOSIS — Z1231 Encounter for screening mammogram for malignant neoplasm of breast: Secondary | ICD-10-CM

## 2024-03-18 DIAGNOSIS — M810 Age-related osteoporosis without current pathological fracture: Secondary | ICD-10-CM

## 2024-03-21 ENCOUNTER — Encounter (INDEPENDENT_AMBULATORY_CARE_PROVIDER_SITE_OTHER): Payer: Self-pay

## 2024-03-21 ENCOUNTER — Ambulatory Visit (INDEPENDENT_AMBULATORY_CARE_PROVIDER_SITE_OTHER): Payer: Medicare Other | Admitting: Gastroenterology

## 2024-03-26 ENCOUNTER — Encounter: Payer: Self-pay | Admitting: Psychiatry

## 2024-03-26 ENCOUNTER — Telehealth: Admitting: Psychiatry

## 2024-03-26 DIAGNOSIS — F4310 Post-traumatic stress disorder, unspecified: Secondary | ICD-10-CM | POA: Diagnosis not present

## 2024-03-26 DIAGNOSIS — F3181 Bipolar II disorder: Secondary | ICD-10-CM

## 2024-03-26 DIAGNOSIS — F17210 Nicotine dependence, cigarettes, uncomplicated: Secondary | ICD-10-CM | POA: Diagnosis not present

## 2024-03-26 DIAGNOSIS — F3178 Bipolar disorder, in full remission, most recent episode mixed: Secondary | ICD-10-CM

## 2024-03-26 DIAGNOSIS — F411 Generalized anxiety disorder: Secondary | ICD-10-CM

## 2024-03-26 DIAGNOSIS — F17200 Nicotine dependence, unspecified, uncomplicated: Secondary | ICD-10-CM

## 2024-03-26 NOTE — Progress Notes (Signed)
 Virtual Visit via Video Note  I connected with Morgan Fields on 03/26/24 at 10:00 AM EDT by a video enabled telemedicine application and verified that I am speaking with the correct person using two identifiers.  Location Provider Location : [facility] Patient Location : Home  Participants: Patient , Provider   I discussed the limitations of evaluation and management by telemedicine and the availability of in person appointments. The patient expressed understanding and agreed to proceed.   I discussed the assessment and treatment plan with the patient. The patient was provided an opportunity to ask questions and all were answered. The patient agreed with the plan and demonstrated an understanding of the instructions.   The patient was advised to call back or seek an in-person evaluation if the symptoms worsen or if the condition fails to improve as anticipated.   BH MD OP Progress Note  03/27/2024 8:11 AM Morgan Fields  MRN:  [id number]  Chief Complaint:  Chief Complaint  Patient presents with   Follow-up   Depression   Anxiety   Medication Refill   Discussed the use of AI scribe software for clinical note transcription with the patient, who gave verbal consent to proceed.  History of Present Illness Morgan Fields is a 73 year old Caucasian female, married, retired, lives in [place], has a history of PTSD, GAD, depression, tobacco use disorder, arthritis, osteoporosis was evaluated by telemedicine today.  She has experienced significant improvement in her depression symptoms, achieving a score of zero on a recent depression rating test. She attributes this improvement to her current medication regimen.  Her sleep has improved since the last visit, although she now wakes up around 4 AM. She experiences dreams that are 'chaotic' but not distressing. She is currently taking prazosin  for nightmares and reports no significant side effects, though she occasionally feels  slightly off in the early morning. She has reduced her Seroquel  dosage due to excessive sedation at higher doses. She is getting approximately 8 to 9 hours of sleep per night and enjoys waking up early.  Her weight has increased from 112 lbs in February to 118 lbs currently. She attributes occasional stomach issues to dietary choices, such as eating pears, which she has since adjusted.  She is smoking less than ten cigarettes a day and is not using any cessation aids. She feels less reliant on smoking as a stress reliever and is more active.  She is not taking propranolol  as needed anymore.  She is managing her anxiety symptoms well and denies any panic attacks.  Denies thoughts of self-harm or harm to others.  She is currently compliant on all her medications.  No dizziness or significant side effects from her medications.     Visit Diagnosis:    ICD-10-CM   1. PTSD (post-traumatic stress disorder)  F43.10     2. GAD (generalized anxiety disorder)  F41.1     3. Bipolar disorder, in full remission, most recent episode mixed (HCC)  F31.78    Type II    4. Tobacco use disorder  F17.200       Past Psychiatric History: I have reviewed past psychiatric history from progress note on 02/15/2022.  Past trials of medications like Lexapro , hydroxyzine , Cymbalta , olanzapine.  Past Medical History:  Past Medical History:  Diagnosis Date   Complication of anesthesia    takes a long time to wake up from anesthesia   Depression    Hiatal hernia    HLD (hyperlipidemia) 07/22/2019  IBS (irritable bowel syndrome)    Osteoporosis 07/22/2019   PMB (postmenopausal bleeding) 03/11/2016   Post-menopause on HRT (hormone replacement therapy) 03/11/2016   Skin cancer    Recent to anterior chest; cervical cancer   Thickened endometrium 03/16/2016   Vaginal Pap smear, abnormal    Vitamin D  deficiency disease 07/22/2019    Past Surgical History:  Procedure Laterality Date   BIOPSY  11/08/2022    Procedure: BIOPSY;  Surgeon: Eartha Angelia Sieving, MD;  [facility];  Service: Gastroenterology;;   BIOPSY  12/14/2022   Procedure: BIOPSY;  Surgeon: Eartha Angelia Sieving, MD;  [facility];  Service: Gastroenterology;;   BREAST ENHANCEMENT SURGERY     CERVICAL BIOPSY     precancerous years ago   CHOLECYSTECTOMY  2011   COLONOSCOPY N/A 05/08/2013   Procedure: COLONOSCOPY;  Surgeon: Claudis RAYMOND Rivet, MD;  [facility]: [facility];  Service: Endoscopy;  Laterality: N/A;  200   COLONOSCOPY WITH PROPOFOL  N/A 12/14/2022   Procedure: COLONOSCOPY WITH PROPOFOL ;  Surgeon: Eartha Angelia Sieving, MD;  [facility];  Service: Gastroenterology;  Laterality: N/A;  9:30am;ASA 2   ESOPHAGOGASTRODUODENOSCOPY (EGD) WITH PROPOFOL  N/A 11/08/2022   Procedure: ESOPHAGOGASTRODUODENOSCOPY (EGD) WITH PROPOFOL ;  Surgeon: Eartha Angelia Sieving, MD;  [facility];  Service: Gastroenterology;  Laterality: N/A;  10:15 AM   HYSTEROSCOPY WITH D & C N/A 10/05/2016   Procedure: HYSTEROSCOPY; UTERINE CURETTAGE;  Surgeon: Vonn VEAR Inch, MD;  Location: [facility];  Service: Gynecology;  Laterality: N/A;   POLYPECTOMY N/A 10/05/2016   Procedure: REMOVAL OF ENDOMETRIAL POLYP;  Surgeon: Vonn VEAR Inch, MD;  Location: [facility];  Service: Gynecology;  Laterality: N/A;   TUBAL LIGATION      Family Psychiatric History: I have reviewed family psychiatric history from progress note on 02/15/2022.  Family History:  Family History  Problem Relation Age of Onset   Dementia Mother    Hyperlipidemia Mother    Thyroid  disease Mother    Hypertension Mother    Schizophrenia Father    Alcohol abuse Father        Schizophrenia   Depression Brother    Mental illness Brother        Schizophrenia (2 brothers); schizophrenia and bipolar (1 brother)   Heart attack Brother    Schizophrenia Brother    Kidney failure Brother    Bipolar disorder Brother    Alcohol abuse Maternal Uncle     Alcohol abuse Paternal Uncle    Alzheimer's disease Maternal Grandfather    Cancer Maternal Grandmother        colon   Diabetes Maternal Grandmother    Heart attack Paternal Grandfather    Heart disease Paternal Grandfather    Diabetes Paternal Grandfather    Mental illness Paternal Grandmother    Polycystic ovary syndrome Daughter    Other Daughter        knee replacement   Thyroid  disease Daughter        had radiation on thyroid     Social History: I have reviewed social history from progress note on 02/15/2022. Social History   Socioeconomic History   Marital status: Married    Spouse name: Not on file   Number of children: Not on file   Years of education: Not on file   Highest education level: Not on file  Occupational History   Not on file  Tobacco Use   Smoking status: Every Day    Current packs/day: 0.25  Average packs/day: 0.3 packs/day for 37.0 years (9.3 ttl pk-yrs)    Types: Cigarettes    Passive exposure: Current   Smokeless tobacco: Never   Tobacco comments:    smokes 5 cig daily- cutting back  Vaping Use   Vaping status: Never Used  Substance and Sexual Activity   Alcohol use: Not Currently    Comment: Quit September 2019   Drug use: No   Sexual activity: Yes    Birth control/protection: Post-menopausal  Other Topics Concern   Not on file  Social History Narrative   Married for 49 years.Retired,used to work at [employer].   Social Drivers of Corporate investment banker Strain: Low Risk  (01/09/2020)   Overall Financial Resource Strain (CARDIA)    Difficulty of Paying Living Expenses: Not hard at all  Food Insecurity: No Food Insecurity (01/09/2020)   Hunger Vital Sign    Worried About Running Out of Food in the Last Year: Never true    Ran Out of Food in the Last Year: Never true  Transportation Needs: No Transportation Needs (01/09/2020)   PRAPARE - Administrator, Civil Service (Medical): No    Lack of Transportation  (Non-Medical): No  Physical Activity: Sufficiently Active (01/09/2020)   Exercise Vital Sign    Days of Exercise per Week: 5 days    Minutes of Exercise per Session: 30 min  Stress: No Stress Concern Present (01/09/2020)   [employer] of Occupational Health - Occupational Stress Questionnaire    Feeling of Stress : Only a little  Social Connections: Socially Integrated (01/09/2020)   Social Connection and Isolation Panel    Frequency of Communication with Friends and Family: Once a week    Frequency of Social Gatherings with Friends and Family: More than three times a week    Attends Religious Services: More than 4 times per year    Active Member of Clubs or Organizations: Yes    Attends Engineer, structural: More than 4 times per year    Marital Status: Married    Allergies: No Known Allergies  Metabolic Disorder Labs: No results found for: HGBA1C, MPG No results found for: PROLACTIN Lab Results  Component Value Date   CHOL 239 (H) 01/21/2021   TRIG 86 01/21/2021   HDL 65 01/21/2021   CHOLHDL 3.7 01/21/2021   LDLCALC 155 (H) 01/21/2021   LDLCALC 134 (H) 05/28/2020   Lab Results  Component Value Date   TSH 1.070 03/07/2022    Therapeutic Level Labs: No results found for: LITHIUM No results found for: VALPROATE No results found for: CBMZ  Current Medications: Current Outpatient Medications  Medication Sig Dispense Refill   ezetimibe (ZETIA) 10 MG tablet Take 10 mg by mouth. One three times per week     famotidine  (PEPCID ) 20 MG tablet Take 20 mg by mouth. Takes as needed     FLUoxetine  (PROZAC ) 10 MG capsule Take 3 capsules (30 mg total) by mouth as directed. Take 2 tablets daily AM and 1 tablet PM 270 capsule 1   lamoTRIgine  (LAMICTAL ) 25 MG tablet Take 1 tablet (25 mg total) by mouth 2 (two) times daily. 60 tablet 1   prazosin  (MINIPRESS ) 1 MG capsule TAKE 1 CAPSULE AT BEDTIME 90 capsule 3   QUEtiapine  (SEROQUEL ) 25 MG tablet Take 1.5 tablets  (37.5 mg total) by mouth at bedtime.     No current facility-administered medications for this visit.     Musculoskeletal: Strength & Muscle Tone:  UTA Gait & Station: Seated Patient leans: N/A  Psychiatric Specialty Exam: Review of Systems  Psychiatric/Behavioral: Negative.      There were no vitals taken for this visit.There is no height or weight on file to calculate BMI.  General Appearance: Fairly Groomed  Eye Contact:  Fair  Speech:  Clear and Coherent  Volume:  Normal  Mood:  Euthymic  Affect:  Congruent  Thought Process:  Goal Directed and Descriptions of Associations: Intact  Orientation:  Full (Time, Place, and Person)  Thought Content: Logical   Suicidal Thoughts:  No  Homicidal Thoughts:  No  Memory:  Immediate;   Fair Recent;   Fair Remote;   Fair  Judgement:  Intact  Insight:  Fair  Psychomotor Activity:  Normal  Concentration:  Concentration: Fair and Attention Span: Fair  Recall:  [employer] of Knowledge: Fair  Language: Fair  Akathisia:  No  Handed:  Right  AIMS (if indicated): not done  Assets:  Communication Skills Desire for Improvement Social Support Talents/Skills Transportation  ADL's:  Intact  Cognition: WNL  Sleep:  Fair   Screenings: Midwife Visit from 01/26/2023 in [facility] Office Visit from 05/11/2022 in [facility]  AIMS Total Score 0 0   GAD-7    Flowsheet Row Office Visit from 11/23/2023 in [facility] Video Visit from 03/28/2023 in [facility] Office Visit from 01/26/2023 in [facility] Office Visit from 12/21/2022 in [facility] Office Visit from 08/24/2022 in [facility]  Total GAD-7 Score 21 7 1 14 7    PHQ2-9    Flowsheet Row  Office Visit from 11/23/2023 in [facility] Video Visit from 05/11/2023 in [facility] Office Visit from 01/26/2023 in [facility] Counselor from 12/26/2022 [facility] Office Visit from 12/21/2022 in [facility]  PHQ-2 Total Score 2 0 0 5 5  PHQ-9 Total Score 11 -- 3 18 20    Flowsheet Row Video Visit from 03/26/2024 in [facility] Video Visit from 02/26/2024 in [facility] Video Visit from 02/05/2024 in [facility]  C-SSRS RISK CATEGORY No Risk No Risk No Risk     Assessment and Plan: Morgan Fields is a 73 year old Caucasian female who has a history of PTSD, MDD, GAD was evaluated by telemedicine today.  Discussed assessment and plan as noted below.  Bipolar disorder type II-hypomanic episode with mixed features in remission Currently mood symptoms well managed on current medication regimen. Continue Seroquel  37.5 mg daily at night (dose reduced due to excessive sedation on higher dosage) Continue Lamictal  25 mg twice daily.  Posttraumatic stress disorder-stable Currently denies any significant trauma related symptoms Continue Fluoxetine  30 mg daily Continue Prazosin  1 mg at bedtime  Generalized anxiety disorder-stable Denies any significant anxiety attacks Continue Fluoxetine  30 mg daily Discontinue Propranolol  for noncompliance.  Follow-up Follow-up in clinic in 3 months or sooner in person.   Consent: Patient/Guardian gives verbal consent for treatment and assignment of benefits for services provided during this visit. Patient/Guardian expressed understanding and agreed to proceed.   This note was generated in part or whole with voice recognition software. Voice recognition is  usually quite  accurate but there are transcription errors that can and very often do occur. I apologize for any typographical errors that were not detected and corrected.    Jt Brabec, MD 03/27/2024, 8:11 AM

## 2024-04-03 ENCOUNTER — Encounter (HOSPITAL_COMMUNITY): Payer: Self-pay

## 2024-04-03 ENCOUNTER — Other Ambulatory Visit (HOSPITAL_COMMUNITY): Payer: Self-pay | Admitting: Adult Health

## 2024-04-03 ENCOUNTER — Ambulatory Visit (HOSPITAL_COMMUNITY)
Admission: RE | Admit: 2024-04-03 | Discharge: 2024-04-03 | Disposition: A | Discharge status: Home / Self Care | Source: Ambulatory Visit | Attending: Adult Health | Admitting: Adult Health

## 2024-04-03 ENCOUNTER — Ambulatory Visit (HOSPITAL_COMMUNITY)
Admission: RE | Admit: 2024-04-03 | Discharge: 2024-04-03 | Disposition: A | Discharge status: Home / Self Care | Source: Ambulatory Visit | Attending: Obstetrics & Gynecology | Admitting: Obstetrics & Gynecology

## 2024-04-03 DIAGNOSIS — Z1231 Encounter for screening mammogram for malignant neoplasm of breast: Secondary | ICD-10-CM | POA: Insufficient documentation

## 2024-04-03 DIAGNOSIS — M810 Age-related osteoporosis without current pathological fracture: Secondary | ICD-10-CM | POA: Insufficient documentation

## 2024-04-22 ENCOUNTER — Other Ambulatory Visit: Payer: Self-pay | Admitting: Psychiatry

## 2024-04-22 ENCOUNTER — Telehealth: Payer: Self-pay

## 2024-04-22 DIAGNOSIS — F4310 Post-traumatic stress disorder, unspecified: Secondary | ICD-10-CM

## 2024-04-22 DIAGNOSIS — F330 Major depressive disorder, recurrent, mild: Secondary | ICD-10-CM

## 2024-04-22 DIAGNOSIS — F411 Generalized anxiety disorder: Secondary | ICD-10-CM

## 2024-04-22 MED ORDER — FLUOXETINE HCL 10 MG PO CAPS
30.0000 mg | ORAL_CAPSULE | ORAL | 0 refills | Status: DC
Start: 2024-04-22 — End: 2024-06-16

## 2024-04-22 NOTE — Telephone Encounter (Signed)
 Ordered

## 2024-04-22 NOTE — Telephone Encounter (Signed)
 Received fax requesting a refill of FLUoxetine  (PROZAC ) 10 MG capsule    Last visit 03-26-24 Next visit 07-03-24   Preferred Pharmacies   [facility] - [place], [place] - [address] Phone: [phone]  Fax: [phone]

## 2024-04-26 ENCOUNTER — Other Ambulatory Visit: Payer: Self-pay | Admitting: Psychiatry

## 2024-04-26 DIAGNOSIS — F4310 Post-traumatic stress disorder, unspecified: Secondary | ICD-10-CM

## 2024-04-26 DIAGNOSIS — F411 Generalized anxiety disorder: Secondary | ICD-10-CM

## 2024-04-26 DIAGNOSIS — F3177 Bipolar disorder, in partial remission, most recent episode mixed: Secondary | ICD-10-CM

## 2024-05-27 DIAGNOSIS — F3177 Bipolar disorder, in partial remission, most recent episode mixed: Secondary | ICD-10-CM

## 2024-05-27 DIAGNOSIS — F411 Generalized anxiety disorder: Secondary | ICD-10-CM

## 2024-05-27 DIAGNOSIS — F4310 Post-traumatic stress disorder, unspecified: Secondary | ICD-10-CM

## 2024-05-27 MED ORDER — LAMOTRIGINE 25 MG PO TABS
25.0000 mg | ORAL_TABLET | Freq: Two times a day (BID) | ORAL | 2 refills | Status: DC
Start: 2024-05-27 — End: 2024-08-01

## 2024-05-28 ENCOUNTER — Other Ambulatory Visit: Payer: Self-pay | Admitting: Psychiatry

## 2024-05-28 DIAGNOSIS — F3177 Bipolar disorder, in partial remission, most recent episode mixed: Secondary | ICD-10-CM

## 2024-05-28 DIAGNOSIS — F411 Generalized anxiety disorder: Secondary | ICD-10-CM

## 2024-05-28 DIAGNOSIS — F4310 Post-traumatic stress disorder, unspecified: Secondary | ICD-10-CM

## 2024-06-14 ENCOUNTER — Telehealth: Payer: Self-pay

## 2024-06-14 DIAGNOSIS — F411 Generalized anxiety disorder: Secondary | ICD-10-CM

## 2024-06-14 DIAGNOSIS — F330 Major depressive disorder, recurrent, mild: Secondary | ICD-10-CM

## 2024-06-14 DIAGNOSIS — F4310 Post-traumatic stress disorder, unspecified: Secondary | ICD-10-CM

## 2024-06-14 NOTE — Telephone Encounter (Signed)
 pt called states that she needs a refill on the prozac . pt was last seen on 7-8 next appt 10-15

## 2024-06-16 MED ORDER — FLUOXETINE HCL 10 MG PO CAPS: 30.0000 mg | ORAL_CAPSULE | ORAL | 1 refills | Status: AC

## 2024-06-16 NOTE — Telephone Encounter (Signed)
 I have sent Prozac  to [facility] as requested.

## 2024-06-18 NOTE — Telephone Encounter (Signed)
 Pt.notified

## 2024-07-03 ENCOUNTER — Encounter: Payer: Self-pay | Admitting: Psychiatry

## 2024-07-03 ENCOUNTER — Encounter (INDEPENDENT_AMBULATORY_CARE_PROVIDER_SITE_OTHER): Payer: Self-pay | Admitting: Gastroenterology

## 2024-07-03 ENCOUNTER — Ambulatory Visit (INDEPENDENT_AMBULATORY_CARE_PROVIDER_SITE_OTHER): Admitting: Psychiatry

## 2024-07-03 VITALS — BP 110/78 | HR 79 | Ht 62.0 in | Wt 121.8 lb

## 2024-07-03 DIAGNOSIS — F4310 Post-traumatic stress disorder, unspecified: Secondary | ICD-10-CM

## 2024-07-03 DIAGNOSIS — F17200 Nicotine dependence, unspecified, uncomplicated: Secondary | ICD-10-CM | POA: Diagnosis not present

## 2024-07-03 DIAGNOSIS — F3178 Bipolar disorder, in full remission, most recent episode mixed: Secondary | ICD-10-CM | POA: Insufficient documentation

## 2024-07-03 DIAGNOSIS — F411 Generalized anxiety disorder: Secondary | ICD-10-CM | POA: Diagnosis not present

## 2024-07-03 MED ORDER — PRAZOSIN HCL 1 MG PO CAPS: 1.0000 mg | ORAL_CAPSULE | Freq: Every day | ORAL | 3 refills | Status: DC

## 2024-07-03 MED ORDER — QUETIAPINE FUMARATE 25 MG PO TABS: 37.5000 mg | ORAL_TABLET | Freq: Every day | ORAL | 3 refills | Status: AC

## 2024-07-03 NOTE — Progress Notes (Signed)
 [facility] MD OP Progress Note  07/03/2024 9:31 AM Morgan Fields  MRN:  [id number]  Chief Complaint:  Chief Complaint  Patient presents with   Follow-up   Depression   Anxiety   Medication Refill   Discussed the use of AI scribe software for clinical note transcription with the patient, who gave verbal consent to proceed.  History of Present Illness Morgan Fields is a 73 year old Caucasian female, married, retired, lives in [place], has a history of PTSD, GAD, depression, tobacco use disorder, arthritis, osteoporosis was evaluated in office today for a follow-up appointment.  Overalll, she reports doing well mentally, except for a period beginning in August after starting alendronate for osteoporosis. She describes developing severe bone and joint pain, which led to significant functional impairment and contributed to a marked worsening of her mood. Within two weeks of starting alendronate, she began to feel increasingly down, eventually experiencing what she describes as a 'dark depression' with persistent negative thoughts and a sense of being unable to recover. During this period, she notes a significant increase in anxiety, to the point that she took an anti-anxiety medication, propranolol  10 mg for the first time in a year. She reports that the pain and functional limitations caused by the new medication contributed to the exacerbation of her depression and anxiety. After stopping alendronate on September 27, she noticed improvement in her mood and anxiety within about a week.  She currently reports sleep and appetite is fair.  Outside of this episode, she describes her mood as stable. She denies any current or recent symptoms of mania or hypomania. When her mood is stable, she finds her usual coping strategies, including acknowledging her emotions and maintaining a routine, effective. She identifies her ability to engage in daily activities, such as working in the yard and caring for  her dogs, as an important indicator of her mental health.  She denies any current suicidality, homicidal perceptual disturbance.  She confirms that she currently takes lamotrigine  25 mg twice daily, fluoxetine  30 mg daily, prazosin  1 mg (Minipress ), and quetiapine  37.5 mg (one and a half tablets) at bedtime. She also reports using methocarbamol as a muscle relaxer and Tylenol  for pain as directed by her other provider during her recent illness.  She reports current tobacco use, smoking approximately 5 to 7 cigarettes per day. She describes an increase in smoking frequency during a recent period of heightened anxiety and pain, with subsequent reduction as symptoms improved.  She lives with her spouse. She enjoys working in the yard, gardening, and pruning trees. She walks regularly, including walking 3 rescue dogs at a local track. She does not participate in organized exercise but stays physically active through daily routines and yard work.     Visit Diagnosis:    ICD-10-CM   1. PTSD (post-traumatic stress disorder)  F43.10 prazosin  (MINIPRESS ) 1 MG capsule    2. GAD (generalized anxiety disorder)  F41.1 prazosin  (MINIPRESS ) 1 MG capsule    3. Bipolar disorder, in full remission, most recent episode mixed  F31.78 QUEtiapine  (SEROQUEL ) 25 MG tablet   Type 2 ,moderate    4. Tobacco use disorder  F17.200    Mild      Past Psychiatric History: I have reviewed past psychiatric history from progress note on 02/15/2022.  Past trials of medications like Lexapro , hydroxyzine , Cymbalta , olanzapine.  Past Medical History:  Past Medical History:  Diagnosis Date   Complication of anesthesia    takes a long time to wake up  from anesthesia   Depression    Hiatal hernia    HLD (hyperlipidemia) 07/22/2019   IBS (irritable bowel syndrome)    Osteoporosis 07/22/2019   PMB (postmenopausal bleeding) 03/11/2016   Post-menopause on HRT (hormone replacement therapy) 03/11/2016   Skin cancer     Recent to anterior chest; cervical cancer   Thickened endometrium 03/16/2016   Vaginal Pap smear, abnormal    Vitamin D  deficiency disease 07/22/2019    Past Surgical History:  Procedure Laterality Date   BIOPSY  11/08/2022   Procedure: BIOPSY;  Surgeon: Eartha Angelia Sieving, MD;  [facility];  Service: Gastroenterology;;   BIOPSY  12/14/2022   Procedure: BIOPSY;  Surgeon: Eartha Angelia Sieving, MD;  [facility];  Service: Gastroenterology;;   BREAST ENHANCEMENT SURGERY     CERVICAL BIOPSY     precancerous years ago   CHOLECYSTECTOMY  2011   COLONOSCOPY N/A 05/08/2013   Procedure: COLONOSCOPY;  Surgeon: Claudis RAYMOND Rivet, MD;  [facility]: [facility];  Service: Endoscopy;  Laterality: N/A;  200   COLONOSCOPY WITH PROPOFOL  N/A 12/14/2022   Procedure: COLONOSCOPY WITH PROPOFOL ;  Surgeon: Eartha Angelia Sieving, MD;  [facility];  Service: Gastroenterology;  Laterality: N/A;  9:30am;ASA 2   ESOPHAGOGASTRODUODENOSCOPY (EGD) WITH PROPOFOL  N/A 11/08/2022   Procedure: ESOPHAGOGASTRODUODENOSCOPY (EGD) WITH PROPOFOL ;  Surgeon: Eartha Angelia Sieving, MD;  [facility];  Service: Gastroenterology;  Laterality: N/A;  10:15 AM   HYSTEROSCOPY WITH D & C N/A 10/05/2016   Procedure: HYSTEROSCOPY; UTERINE CURETTAGE;  Surgeon: Vonn VEAR Inch, MD;  Location: [facility];  Service: Gynecology;  Laterality: N/A;   POLYPECTOMY N/A 10/05/2016   Procedure: REMOVAL OF ENDOMETRIAL POLYP;  Surgeon: Vonn VEAR Inch, MD;  Location: [facility];  Service: Gynecology;  Laterality: N/A;   TUBAL LIGATION      Family Psychiatric History: Reviewed family psychiatric history from progress note on 02/15/2022.  Family History:  Family History  Problem Relation Age of Onset   Dementia Mother    Hyperlipidemia Mother    Thyroid  disease Mother    Hypertension Mother    Schizophrenia Father    Alcohol abuse Father        Schizophrenia   Depression Brother    Mental illness  Brother        Schizophrenia (2 brothers); schizophrenia and bipolar (1 brother)   Heart attack Brother    Schizophrenia Brother    Kidney failure Brother    Bipolar disorder Brother    Alcohol abuse Maternal Uncle    Alcohol abuse Paternal Uncle    Alzheimer's disease Maternal Grandfather    Cancer Maternal Grandmother        colon   Diabetes Maternal Grandmother    Heart attack Paternal Grandfather    Heart disease Paternal Grandfather    Diabetes Paternal Grandfather    Mental illness Paternal Grandmother    Polycystic ovary syndrome Daughter    Other Daughter        knee replacement   Thyroid  disease Daughter        had radiation on thyroid     Social History: I have reviewed social history from progress note on 02/15/2022. Social History   Socioeconomic History   Marital status: Married    Spouse name: Not on file   Number of children: Not on file   Years of education: Not on file   Highest education level: Not on file  Occupational History   Not on file  Tobacco Use   Smoking status: Every Day    Current packs/day: 0.25    Average packs/day: 0.3 packs/day for 37.0 years (9.3 ttl pk-yrs)    Types: Cigarettes    Passive exposure: Current   Smokeless tobacco: Never   Tobacco comments:    smokes 5 cig daily- cutting back  Vaping Use   Vaping status: Never Used  Substance and Sexual Activity   Alcohol use: Not Currently    Comment: Quit September 2019   Drug use: No   Sexual activity: Yes    Birth control/protection: Post-menopausal  Other Topics Concern   Not on file  Social History Narrative   Married for 49 years.Retired,used to work at [employer].   Social Drivers of Corporate investment banker Strain: Low Risk  (01/09/2020)   Overall Financial Resource Strain (CARDIA)    Difficulty of Paying Living Expenses: Not hard at all  Food Insecurity: No Food Insecurity (01/09/2020)   Hunger Vital Sign    Worried About Running Out of Food in the Last  Year: Never true    Ran Out of Food in the Last Year: Never true  Transportation Needs: No Transportation Needs (01/09/2020)   PRAPARE - Administrator, Civil Service (Medical): No    Lack of Transportation (Non-Medical): No  Physical Activity: Sufficiently Active (01/09/2020)   Exercise Vital Sign    Days of Exercise per Week: 5 days    Minutes of Exercise per Session: 30 min  Stress: No Stress Concern Present (01/09/2020)   [employer] of Occupational Health - Occupational Stress Questionnaire    Feeling of Stress : Only a little  Social Connections: Socially Integrated (01/09/2020)   Social Connection and Isolation Panel    Frequency of Communication with Friends and Family: Once a week    Frequency of Social Gatherings with Friends and Family: More than three times a week    Attends Religious Services: More than 4 times per year    Active Member of Clubs or Organizations: Yes    Attends Engineer, structural: More than 4 times per year    Marital Status: Married    Allergies: No Known Allergies  Metabolic Disorder Labs: No results found for: HGBA1C, MPG No results found for: PROLACTIN Lab Results  Component Value Date   CHOL 239 (H) 01/21/2021   TRIG 86 01/21/2021   HDL 65 01/21/2021   CHOLHDL 3.7 01/21/2021   LDLCALC 155 (H) 01/21/2021   LDLCALC 134 (H) 05/28/2020   Lab Results  Component Value Date   TSH 1.070 03/07/2022    Therapeutic Level Labs: No results found for: LITHIUM No results found for: VALPROATE No results found for: CBMZ  Current Medications: Current Outpatient Medications  Medication Sig Dispense Refill   ezetimibe (ZETIA) 10 MG tablet Take 10 mg by mouth. One three times per week     famotidine  (PEPCID ) 20 MG tablet Take 20 mg by mouth. Takes as needed     FLUoxetine  (PROZAC ) 10 MG capsule Take 3 capsules (30 mg total) by mouth as directed. Take 2 tablets daily AM and 1 tablet PM 270 capsule 1   lamoTRIgine   (LAMICTAL ) 25 MG tablet Take 1 tablet (25 mg total) by mouth 2 (two) times daily. 60 tablet 2   methocarbamol (ROBAXIN) 500 MG tablet Take 1 tablet twice a day by oral route as needed for 90 days.     propranolol  (INDERAL ) 10 MG tablet Take 10 mg by mouth  daily as needed.     prazosin  (MINIPRESS ) 1 MG capsule Take 1 capsule (1 mg total) by mouth at bedtime. 90 capsule 3   QUEtiapine  (SEROQUEL ) 25 MG tablet Take 1.5 tablets (37.5 mg total) by mouth at bedtime. 135 tablet 3   No current facility-administered medications for this visit.     Musculoskeletal: Strength & Muscle Tone: within normal limits Gait & Station: normal Patient leans: N/A  Psychiatric Specialty Exam: Review of Systems  Psychiatric/Behavioral: Negative.      Blood pressure 110/78, pulse 79, height 5' 2 (1.575 m), weight 121 lb 12.8 oz (55.2 kg), SpO2 100%.Body mass index is 22.28 kg/m.  General Appearance: Casual  Eye Contact:  Fair  Speech:  Clear and Coherent  Volume:  Normal  Mood:  Euthymic  Affect:  Full Range  Thought Process:  Goal Directed and Descriptions of Associations: Intact  Orientation:  Full (Time, Place, and Person)  Thought Content: Logical   Suicidal Thoughts:  No  Homicidal Thoughts:  No  Memory:  Immediate;   Fair Recent;   Fair Remote;   Fair  Judgement:  Fair  Insight:  Fair  Psychomotor Activity:  Normal  Concentration:  Concentration: Fair and Attention Span: Fair  Recall:  [employer] of Knowledge: Fair  Language: Fair  Akathisia:  No  Handed:  Right  AIMS (if indicated): done  Assets:  Communication Skills Desire for Improvement Housing Intimacy Social Support  ADL's:  Intact  Cognition: WNL  Sleep:  Fair   Screenings: Midwife Visit from 07/03/2024 in [facility] Office Visit from 01/26/2023 in [facility] Office Visit from 05/11/2022 in [facility]  AIMS Total Score 0 0 0   GAD-7    Flowsheet Row Office Visit from 07/03/2024 in [facility] Office Visit from 11/23/2023 in [facility] Video Visit from 03/28/2023 in [facility] Office Visit from 01/26/2023 in [facility] Office Visit from 12/21/2022 in [facility]  Total GAD-7 Score 7 21 7 1 14    PHQ2-9    Flowsheet Row Office Visit from 07/03/2024 in [facility] Office Visit from 11/23/2023 in [facility] Video Visit from 05/11/2023 in [facility] Office Visit from 01/26/2023 in [facility] Counselor from 12/26/2022 in [facility]  PHQ-2 Total Score 2 2 0 0 5  PHQ-9 Total Score 4 11 -- 3 18   Flowsheet Row Office Visit from 07/03/2024 in [facility] Video Visit from 03/26/2024 in [facility] Video Visit from 02/26/2024 in [facility]  C-SSRS RISK CATEGORY No Risk No Risk No Risk     Assessment and Plan: Morgan Fields is a 73 year old Caucasian female who has a history of PTSD, MDD, GAD was evaluated in office today.  Discussed assessment and plan as noted below.  1. PTSD (post-traumatic stress disorder)-stable Currently denies any trauma related symptoms and reports sleep is good. Continue Fluoxetine  30 mg daily Continue Prazosin  1 mg at bedtime.  2. GAD (generalized anxiety disorder)-stable Denies any significant anxiety symptoms. Continue Fluoxetine  30 mg daily Continue Propranolol  10 mg as needed, rarely takes it.  3. Bipolar disorder, in  full remission, most recent  episode mixed Denies any depression or manic symptoms Continue Lamictal  25 mg twice daily Continue Seroquel  37.5 mg at bedtime  4. Tobacco use disorder mild-unstable Recent exacerbation of smoking however currently working on cutting back. Will reevaluate in future sessions.  Follow-up Follow-up in clinic in 3 months or sooner if needed.    Consent: Patient/Guardian gives verbal consent for treatment and assignment of benefits for services provided during this visit. Patient/Guardian expressed understanding and agreed to proceed.   This note was generated in part or whole with voice recognition software. Voice recognition is usually quite accurate but there are transcription errors that can and very often do occur. I apologize for any typographical errors that were not detected and corrected.    Gennavieve Huq, MD 07/03/2024, 9:31 AM

## 2024-07-31 DIAGNOSIS — F411 Generalized anxiety disorder: Secondary | ICD-10-CM

## 2024-07-31 DIAGNOSIS — F4310 Post-traumatic stress disorder, unspecified: Secondary | ICD-10-CM

## 2024-07-31 DIAGNOSIS — F3177 Bipolar disorder, in partial remission, most recent episode mixed: Secondary | ICD-10-CM

## 2024-08-01 MED ORDER — LAMOTRIGINE 25 MG PO TABS: 75.0000 mg | ORAL_TABLET | ORAL | 1 refills | Status: DC

## 2024-08-01 MED ORDER — PROPRANOLOL HCL 10 MG PO TABS: 10.0000 mg | ORAL_TABLET | Freq: Every day | ORAL | 0 refills | Status: AC | PRN

## 2024-08-01 NOTE — Telephone Encounter (Signed)
 Contacted patient by phone.  Patient reports recent situational stressors including husband's health issues has triggered anxiety symptoms.  She tried propranolol  from a previous prescription and that kind of help.  She is also agreeable to dosage increase of Lamictal .  She denies any history of cardiac problems asthma, COPD. Start Lamictal  75 mg daily in divided dosage Start propranolol  10 mg daily as needed. Will have patient schedule a sooner visit for reevaluation.

## 2024-09-11 ENCOUNTER — Encounter: Payer: Self-pay | Admitting: Psychiatry

## 2024-09-11 ENCOUNTER — Telehealth: Admitting: Psychiatry

## 2024-09-11 DIAGNOSIS — F4310 Post-traumatic stress disorder, unspecified: Secondary | ICD-10-CM

## 2024-09-11 DIAGNOSIS — F3162 Bipolar disorder, current episode mixed, moderate: Secondary | ICD-10-CM | POA: Diagnosis not present

## 2024-09-11 DIAGNOSIS — F17200 Nicotine dependence, unspecified, uncomplicated: Secondary | ICD-10-CM | POA: Diagnosis not present

## 2024-09-11 DIAGNOSIS — F411 Generalized anxiety disorder: Secondary | ICD-10-CM

## 2024-09-11 MED ORDER — PRAZOSIN HCL 2 MG PO CAPS: 2.0000 mg | ORAL_CAPSULE | Freq: Every day | ORAL | 0 refills | Status: AC

## 2024-09-11 MED ORDER — LAMOTRIGINE 25 MG PO TABS: 75.0000 mg | ORAL_TABLET | ORAL | 0 refills | Status: AC

## 2024-09-11 NOTE — Patient Instructions (Signed)
  [email or web address]  www.psychologytoday  [facility] [email or web address] [address], [place], [place] [address]   [phone]  [facility] [email or web address] [address], [place], [place] [address]  [phone]   [facility] - [phone]  [facility] - [phone]  [facility] - [phone]   [facility] - [phone]  [facility] - [phone]     

## 2024-09-11 NOTE — Progress Notes (Signed)
 Virtual Visit via Video Note  I connected with Morgan Fields on 09/11/2024 at  9:00 AM EST by a video enabled telemedicine application and verified that I am speaking with the correct person using two identifiers.  Location Provider Location : [facility] Patient Location : Home  Participants: Patient , Provider   I discussed the limitations of evaluation and management by telemedicine and the availability of in person appointments. The patient expressed understanding and agreed to proceed.   I discussed the assessment and treatment plan with the patient. The patient was provided an opportunity to ask questions and all were answered. The patient agreed with the plan and demonstrated an understanding of the instructions.   The patient was advised to call back or seek an in-person evaluation if the symptoms worsen or if the condition fails to improve as anticipated.  BH MD OP Progress Note  09/11/2024 9:28 AM Morgan Fields  MRN:  [id number]  Chief Complaint:  Chief Complaint  Patient presents with   Follow-up   Anxiety   Medication Refill   Depression   Discussed the use of AI scribe software for clinical note transcription with the patient, who gave verbal consent to proceed.  History of Present Illness Morgan Fields is a 73 year old Caucasian female, married, retired, lives in [place], has a history of PTSD, GAD, depression, tobacco use disorder, arthritis, osteoporosis was evaluated by telemedicine today.  Ongoing anxiety, most prominent in the evenings and upon awakening, continues to affect her. During the day, she engages in activities such as walking, reading, and completing household tasks, but significant anxiety emerges as she prepares for sleep and in the early morning hours. At the onset of sleep, she describes a recurring sensation of stabbing and feeling trapped, as if trying to stab her way out of a box. Nightmares, though less vivid than before, remain  chaotic, and she maintains awareness that she is dreaming. For the first 3 weeks since her last appointment, these nighttime symptoms caused her particular distress and she found them difficult to control. Over the past week, she has noticed some improvement, with sleep extending until 5:30 or 6:00 AM instead of waking at 3:30 or 4:00 AM. She continues to wake with anxiety and a sense of dread about starting the day.  She reports an urge to do something irrational, such as cutting her hair, and relates this to similar actions from over a decade ago. She denies acting on other urges, such as moving or making major life changes.  Her current regimen includes Lamictal  75 mg, propranolol  10 mg as needed, and Minipress  (prazosin ) 1 mg at bedtime. She has taken Minipress  for years and has not experienced side effects. Coping strategies such as mindfulness, breathing exercises, and acknowledging her feelings, particularly in the mornings when anxiety occurs, remain part of her routine.  She denies any suicidality, homicidality or perceptual disturbances.  She plans to visit her son and his wife for Christmas Eve and see her great grandchild. She will visit her mother and mother-in-law at a nursing home and assisted living, respectively.    Visit Diagnosis:    ICD-10-CM   1. PTSD (post-traumatic stress disorder)  F43.10 prazosin  (MINIPRESS ) 2 MG capsule    lamoTRIgine  (LAMICTAL ) 25 MG tablet    2. GAD (generalized anxiety disorder)  F41.1 lamoTRIgine  (LAMICTAL ) 25 MG tablet    3. Bipolar disorder, current episode mixed, moderate (HCC)  F31.62    type 2 , improving    4. Tobacco  use disorder  F17.200    Mild      Past Psychiatric History: I have reviewed past psychiatric history from progress note on 02/15/2022.  Past trials of medications like Lexapro , hydroxyzine , Cymbalta , olanzapine.  Past Medical History:  Past Medical History:  Diagnosis Date   Complication of anesthesia    takes a long  time to wake up from anesthesia   Depression    Hiatal hernia    HLD (hyperlipidemia) 07/22/2019   IBS (irritable bowel syndrome)    Osteoporosis 07/22/2019   PMB (postmenopausal bleeding) 03/11/2016   Post-menopause on HRT (hormone replacement therapy) 03/11/2016   Skin cancer    Recent to anterior chest; cervical cancer   Thickened endometrium 03/16/2016   Vaginal Pap smear, abnormal    Vitamin D  deficiency disease 07/22/2019    Past Surgical History:  Procedure Laterality Date   BIOPSY  11/08/2022   Procedure: BIOPSY;  Surgeon: Eartha Angelia Sieving, MD;  [facility];  Service: Gastroenterology;;   BIOPSY  12/14/2022   Procedure: BIOPSY;  Surgeon: Eartha Angelia Sieving, MD;  [facility];  Service: Gastroenterology;;   BREAST ENHANCEMENT SURGERY     CERVICAL BIOPSY     precancerous years ago   CHOLECYSTECTOMY  2011   COLONOSCOPY N/A 05/08/2013   Procedure: COLONOSCOPY;  Surgeon: Claudis RAYMOND Rivet, MD;  [facility]: [facility];  Service: Endoscopy;  Laterality: N/A;  200   COLONOSCOPY WITH PROPOFOL  N/A 12/14/2022   Procedure: COLONOSCOPY WITH PROPOFOL ;  Surgeon: Eartha Angelia Sieving, MD;  [facility]: [facility];  Service: Gastroenterology;  Laterality: N/A;  9:30am;ASA 2   ESOPHAGOGASTRODUODENOSCOPY (EGD) WITH PROPOFOL  N/A 11/08/2022   Procedure: ESOPHAGOGASTRODUODENOSCOPY (EGD) WITH PROPOFOL ;  Surgeon: Eartha Angelia Sieving, MD;  [facility];  Service: Gastroenterology;  Laterality: N/A;  10:15 AM   HYSTEROSCOPY WITH D & C N/A 10/05/2016   Procedure: HYSTEROSCOPY; UTERINE CURETTAGE;  Surgeon: Vonn VEAR Inch, MD;  Location: [facility];  Service: Gynecology;  Laterality: N/A;   POLYPECTOMY N/A 10/05/2016   Procedure: REMOVAL OF ENDOMETRIAL POLYP;  Surgeon: Vonn VEAR Inch, MD;  Location: [facility];  Service: Gynecology;  Laterality: N/A;   TUBAL LIGATION      Family Psychiatric History: I have reviewed family psychiatric history from progress  note on 02/15/2022.  Family History:  Family History  Problem Relation Age of Onset   Dementia Mother    Hyperlipidemia Mother    Thyroid  disease Mother    Hypertension Mother    Schizophrenia Father    Alcohol abuse Father        Schizophrenia   Depression Brother    Mental illness Brother        Schizophrenia (2 brothers); schizophrenia and bipolar (1 brother)   Heart attack Brother    Schizophrenia Brother    Kidney failure Brother    Bipolar disorder Brother    Alcohol abuse Maternal Uncle    Alcohol abuse Paternal Uncle    Alzheimer's disease Maternal Grandfather    Cancer Maternal Grandmother        colon   Diabetes Maternal Grandmother    Heart attack Paternal Grandfather    Heart disease Paternal Grandfather    Diabetes Paternal Grandfather    Mental illness Paternal Grandmother    Polycystic ovary syndrome Daughter    Other Daughter        knee replacement   Thyroid  disease Daughter        had radiation on thyroid   Social History: I have reviewed social history from progress note on 02/15/2022. Social History   Socioeconomic History   Marital status: Married    Spouse name: Not on file   Number of children: Not on file   Years of education: Not on file   Highest education level: Not on file  Occupational History   Not on file  Tobacco Use   Smoking status: Every Day    Current packs/day: 0.25    Average packs/day: 0.3 packs/day for 37.0 years (9.3 ttl pk-yrs)    Types: Cigarettes    Passive exposure: Current   Smokeless tobacco: Never   Tobacco comments:    smokes 5 cig daily- cutting back  Vaping Use   Vaping status: Never Used  Substance and Sexual Activity   Alcohol use: Not Currently    Comment: Quit September 2019   Drug use: No   Sexual activity: Yes    Birth control/protection: Post-menopausal  Other Topics Concern   Not on file  Social History Narrative   Married for 49 years.Retired,used to work at [employer].   Social  Drivers of Health   Tobacco Use: High Risk (09/11/2024)   Patient History    Smoking Tobacco Use: Every Day    Smokeless Tobacco Use: Never    Passive Exposure: Current  Financial Resource Strain: Not on file  Food Insecurity: Not on file  Transportation Needs: Not on file  Physical Activity: Not on file  Stress: Not on file  Social Connections: Not on file  Depression (PHQ2-9): Low Risk (07/03/2024)   Depression (PHQ2-9)    PHQ-2 Score: 4  Alcohol Screen: Not on file  Housing: Not on file  Utilities: Not on file  Health Literacy: Not on file    Allergies: Allergies[1]  Metabolic Disorder Labs: No results found for: HGBA1C, MPG No results found for: PROLACTIN Lab Results  Component Value Date   CHOL 239 (H) 01/21/2021   TRIG 86 01/21/2021   HDL 65 01/21/2021   CHOLHDL 3.7 01/21/2021   LDLCALC 155 (H) 01/21/2021   LDLCALC 134 (H) 05/28/2020   Lab Results  Component Value Date   TSH 1.070 03/07/2022    Therapeutic Level Labs: No results found for: LITHIUM No results found for: VALPROATE No results found for: CBMZ  Current Medications: Current Outpatient Medications  Medication Sig Dispense Refill   prazosin  (MINIPRESS ) 2 MG capsule Take 1 capsule (2 mg total) by mouth at bedtime. 90 capsule 0   ezetimibe (ZETIA) 10 MG tablet Take 10 mg by mouth. One three times per week     famotidine  (PEPCID ) 20 MG tablet Take 20 mg by mouth. Takes as needed     FLUoxetine  (PROZAC ) 10 MG capsule Take 3 capsules (30 mg total) by mouth as directed. Take 2 tablets daily AM and 1 tablet PM 270 capsule 1   lamoTRIgine  (LAMICTAL ) 25 MG tablet Take 3 tablets (75 mg total) by mouth as directed. Take 1 tablet every morning and 2 tablets in the afternoon. 270 tablet 0   methocarbamol (ROBAXIN) 500 MG tablet Take 1 tablet twice a day by oral route as needed for 90 days.     propranolol  (INDERAL ) 10 MG tablet Take 1 tablet (10 mg total) by mouth daily as needed. For severe  anxiety attacks 90 tablet 0   QUEtiapine  (SEROQUEL ) 25 MG tablet Take 1.5 tablets (37.5 mg total) by mouth at bedtime. 135 tablet 3   No current facility-administered medications for this visit.  Musculoskeletal: Strength & Muscle Tone: UTA Gait & Station: Seated Patient leans: N/A  Psychiatric Specialty Exam: Review of Systems  Psychiatric/Behavioral:  Positive for dysphoric mood and sleep disturbance. The patient is nervous/anxious.     There were no vitals taken for this visit.There is no height or weight on file to calculate BMI.  General Appearance: Casual  Eye Contact:  Fair  Speech:  Clear and Coherent  Volume:  Normal  Mood:  Anxious and Depressed  Affect:  Congruent  Thought Process:  Goal Directed and Descriptions of Associations: Intact  Orientation:  Full (Time, Place, and Person)  Thought Content: Rumination   Suicidal Thoughts:  No  Homicidal Thoughts:  No  Memory:  Immediate;   Fair Recent;   Fair Remote;   Fair  Judgement:  Fair  Insight:  Fair  Psychomotor Activity:  Normal  Concentration:  Concentration: Fair and Attention Span: Fair  Recall:  [employer] of Knowledge: Fair  Language: Fair  Akathisia:  No  Handed:  Right  AIMS (if indicated): not done  Assets:  Communication Skills Desire for Improvement Housing Social Support Transportation  ADL's:  Intact  Cognition: WNL  Sleep:  varies , nightmares   Screenings: Geneticist, Molecular Office Visit from 07/03/2024 in [facility] Office Visit from 01/26/2023 in [facility] Office Visit from 05/11/2022 in [facility]  AIMS Total Score 0 0 0   GAD-7    Flowsheet Row Office Visit from 07/03/2024 in [facility] Office Visit from 11/23/2023 in [facility] Video Visit from 03/28/2023 in [facility] Office Visit from 01/26/2023 in [facility] Office Visit from 12/21/2022 in [facility]  Total GAD-7 Score 7 21 7 1 14    PHQ2-9    Flowsheet Row Office Visit from 07/03/2024 in [facility] Office Visit from 11/23/2023 in [facility] Video Visit from 05/11/2023 in [facility] Office Visit from 01/26/2023 in [facility] Counselor from 12/26/2022 in [facility]  PHQ-2 Total Score 2 2 0 0 5  PHQ-9 Total Score 4 11 -- 3 18   Flowsheet Row Video Visit from 09/11/2024 in [facility] Office Visit from 07/03/2024 in [facility] Video Visit from 03/26/2024 in [facility]  C-SSRS RISK CATEGORY No Risk No Risk No Risk     Assessment and Plan: GIANNAH ZAVADIL is a 73 year old Caucasian female who has a history of PTSD, MDD, GAD was evaluated by telemedicine today.  Discussed assessment and plan as noted below.  1. PTSD (post-traumatic stress disorder)-unstable Currently with mood swings, sleep problems likely triggered by situational stressors including husband's health issues, holiday season. Increase Prazosin  to 2 mg at bedtime Continue Fluoxetine  30 mg daily  2. GAD (generalized anxiety disorder)-unstable Current anxiety mostly related to situational stressors.  Agreeable to referral for psychotherapy again. Referral to CBT, communicated with staff to schedule this patient with in-house therapist if available.Also provided resources in the community. Continue Fluoxetine  as prescribed Continue Propranolol  10 mg as needed.  3. Bipolar disorder, current episode mixed, moderate  (HCC)-improving Currently reports depression, racing thoughts, anxiety as improving on the current dosage of Lamictal . Continue  Lamictal  75 mg daily in divided dosage Continue Seroquel  37.5 mg at bedtime, did not tolerate higher dosages.  4. Tobacco use disorder mild-unstable Continues to smoke cigarettes. Will reevaluate in future sessions  Follow-up Follow-up in clinic in 3 to 4 weeks or sooner if needed.    Collaboration of Care: Collaboration of Care: Referral or follow-up with counselor/therapist AEB patient referred for psychotherapy, communicated with staff as well as provided resources.  Patient/Guardian was advised Release of Information must be obtained prior to any record release in order to collaborate their care with an outside provider. Patient/Guardian was advised if they have not already done so to contact the registration department to sign all necessary forms in order for us  to release information regarding their care.   Consent: Patient/Guardian gives verbal consent for treatment and assignment of benefits for services provided during this visit. Patient/Guardian expressed understanding and agreed to proceed.   This note was generated in part or whole with voice recognition software. Voice recognition is usually quite accurate but there are transcription errors that can and very often do occur. I apologize for any typographical errors that were not detected and corrected.    Mio Schellinger, MD 09/11/2024, 9:28 AM     [1] No Known Allergies

## 2024-09-16 ENCOUNTER — Ambulatory Visit (INDEPENDENT_AMBULATORY_CARE_PROVIDER_SITE_OTHER)

## 2024-09-16 DIAGNOSIS — F4310 Post-traumatic stress disorder, unspecified: Secondary | ICD-10-CM

## 2024-09-16 DIAGNOSIS — F3162 Bipolar disorder, current episode mixed, moderate: Secondary | ICD-10-CM

## 2024-09-16 DIAGNOSIS — F411 Generalized anxiety disorder: Secondary | ICD-10-CM | POA: Diagnosis not present
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
## 2024-10-01 ENCOUNTER — Telehealth: Admitting: Psychiatry

## 2024-10-03 ENCOUNTER — Encounter (HOSPITAL_COMMUNITY): Payer: Self-pay

## 2024-10-03 ENCOUNTER — Ambulatory Visit (HOSPITAL_COMMUNITY)

## 2024-10-03 DIAGNOSIS — F411 Generalized anxiety disorder: Secondary | ICD-10-CM | POA: Diagnosis not present

## 2024-10-03 DIAGNOSIS — F4310 Post-traumatic stress disorder, unspecified: Secondary | ICD-10-CM

## 2024-10-03 NOTE — Progress Notes (Signed)
 "  THERAPIST PROGRESS NOTE Virtual Visit via Video Note  I connected with Morgan Fields on 10/03/24 at  9:00 AM EST by a video enabled telemedicine application and verified that I am speaking with the correct person using two identifiers.  Location: Patient: home Provider: office   I discussed the limitations of evaluation and management by telemedicine and the availability of in person appointments. The patient expressed understanding and agreed to proceed.  I discussed the assessment and treatment plan with the patient. The patient was provided an opportunity to ask questions and all were answered. The patient agreed with the plan and demonstrated an understanding of the instructions.   The patient was advised to call back or seek an in-person evaluation if the symptoms worsen or if the condition fails to improve as anticipated.  I provided 53 minutes of non-face-to-face time during this encounter.   Lauraine Ferrari, LCSW   Session Time: 9:02 AM - 9:56 AM  Participation Level: Active  Behavioral Response: CasualAlertTearful at times anxious at times  Type of Therapy: Individual Therapy  Treatment Goals addressed: Identifying unhelpful thoughts, working on coping skills, understanding the function of feelings  ProgressTowards Goals: Initial  Interventions: CBT, Strength-based, Supportive, and Reframing  Summary: Morgan Fields is a 74 y.o. female who presents with difficulty with anxiousness, low energy/fatigue, difficulty with sleep and dreams connected to violence, difficulty finding pleasure or interest in things and feelings of need for isolation, sometimes wakes up recalling violent dreams and feels negatively about herself for having these dreams.  Morgan Fields reports that her anxiety is decreased now that it is January because historically October through December have been difficult for her.  She has had less frequent days of anxiety and those are partly affected by  current events.   She had a difficult dream this morning where she had to be violent towards a pack of wolves.  We discussed various interpretations of the dream.   Morgan Fields interprets that whenever she had a dream containing violence, this means something negative about herself.   We talked about models of anger she saw growing up and how she has not been provided good models of handling anger.  We discussed the healthy functions of anger for protection and behavioral activation.  We discussed that avoidance of anger or other emotions can lead to  increased feelings of anxiety.  Morgan Fields was able to recognize alterative interpretations of todays dream as positive about herself and her ability to handle difficult situations.    Morgan Fields reported she felt better after the session.  She was ambivalent about rescheduling, but ultimately did agree to reschedule for about 8 weeks for a check in and may terminate therapy at that time.  Suicidal/Homicidal: Nowithout intent/plan  Therapist Response: Therapist provided a lot of education about emotions and their function and the avoidance model of anxiety.  Therapist encouraged Morgan Fields's current coping skills and strengths.  Therapist assist in reframing exercise with her concerns over dreams and normalized all types of expression in dreams.  Plan: Return again in 8 weeks per pt request.  Diagnosis: GAD (generalized anxiety disorder)  PTSD (post-traumatic stress disorder)  Collaboration of Care: Psychiatrist AEB shared notes  Patient/Guardian was advised Release of Information must be obtained prior to any record release in order to collaborate their care with an outside provider. Patient/Guardian was advised if they have not already done so to contact the registration department to sign all necessary forms in order for us  to release  information regarding their care.   Consent: Patient/Guardian gives verbal consent for treatment and assignment of benefits for  services provided during this visit. Patient/Guardian expressed understanding and agreed to proceed.   Lauraine Ferrari, LCSW 10/03/2024  "

## 2024-10-09 ENCOUNTER — Encounter: Payer: Self-pay | Admitting: Psychiatry

## 2024-10-09 ENCOUNTER — Telehealth: Admitting: Psychiatry

## 2024-10-09 DIAGNOSIS — F17200 Nicotine dependence, unspecified, uncomplicated: Secondary | ICD-10-CM

## 2024-10-09 DIAGNOSIS — F4310 Post-traumatic stress disorder, unspecified: Secondary | ICD-10-CM | POA: Diagnosis not present

## 2024-10-09 DIAGNOSIS — F411 Generalized anxiety disorder: Secondary | ICD-10-CM | POA: Diagnosis not present

## 2024-10-09 DIAGNOSIS — F3178 Bipolar disorder, in full remission, most recent episode mixed: Secondary | ICD-10-CM | POA: Diagnosis not present

## 2024-10-09 NOTE — Progress Notes (Signed)
 Virtual Visit via Video Note  I connected with Morgan Fields on 10/09/24 at  9:00 AM EST by a video enabled telemedicine application and verified that I am speaking with the correct person using two identifiers.  Location Provider Location : [facility] Patient Location : Home  Participants: Patient , Provider    I discussed the limitations of evaluation and management by telemedicine and the availability of in person appointments. The patient expressed understanding and agreed to proceed.  I discussed the assessment and treatment plan with the patient. The patient was provided an opportunity to ask questions and all were answered. The patient agreed with the plan and demonstrated an understanding of the instructions.   The patient was advised to call back or seek an in-person evaluation if the symptoms worsen or if the condition fails to improve as anticipated.   BH MD OP Progress Note  10/09/2024 9:22 AM Morgan Fields  MRN:  [id number]  Chief Complaint:  Chief Complaint  Patient presents with   Medication Refill   Follow-up   Anxiety   Post-Traumatic Stress Disorder   Discussed the use of AI scribe software for clinical note transcription with the patient, who gave verbal consent to proceed.  History of Present Illness Morgan Fields is a 74 year old Caucasian female, married, retired, lives in [place], has a history of PTSD, GAD, depression, tobacco use disorder, arthritis, osteoporosis was evaluated by telemedicine today for a follow-up appointment.  She reports significant improvement in her symptoms since her last visit in December, which she relates to an increase in her medication and starting therapy with a new therapist, Ms.Lauraine Ferrari. She reports that her anxiety has lessened, and she has not needed to take her propranolol  since December. She reports that coping techniques she learned in therapy, such as walking, showering, and consciously choosing to move, have  helped her approach anxiety-provoking situations differently. She describes no longer trying to avoid or escape anxiety and feels she now views situations with a new perspective.  She reports that her nightmares have decreased, with only 1 violent dream occurring the night before January 15th, which she discussed with her therapist. She reports that her therapist provided techniques and a positive outlook that have helped her manage these symptoms. She notes improved sleep quality, stating she wakes up rested and does not dread getting up in the morning.  She reports that her mood and outlook have improved, and she expresses gratitude for her progress while recognizing her own strengths. She describes a positive change in her attitude, feeling more accepting of life's imperfections and more resilient.  She denies any suicidality, homicidality or perceptual disturbances.  She is compliant on her medications like lamotrigine , prazosin , Seroquel .  Denies side effects.      Visit Diagnosis:    ICD-10-CM   1. PTSD (post-traumatic stress disorder)  F43.10     2. GAD (generalized anxiety disorder)  F41.1     3. Bipolar disorder, in full remission, most recent episode mixed  F31.78    Type II    4. Tobacco use disorder  F17.200    Mild      Past Psychiatric History: I have reviewed past psychiatric history from progress note on 02/15/2022.  Past trials of medications like Lexapro , hydroxyzine , Cymbalta , olanzapine.  Past Medical History:  Past Medical History:  Diagnosis Date   Complication of anesthesia    takes a long time to wake up from anesthesia   Depression    Hiatal hernia  HLD (hyperlipidemia) 07/22/2019   IBS (irritable bowel syndrome)    Osteoporosis 07/22/2019   PMB (postmenopausal bleeding) 03/11/2016   Post-menopause on HRT (hormone replacement therapy) 03/11/2016   Skin cancer    Recent to anterior chest; cervical cancer   Thickened endometrium 03/16/2016   Vaginal  Pap smear, abnormal    Vitamin D  deficiency disease 07/22/2019    Past Surgical History:  Procedure Laterality Date   BIOPSY  11/08/2022   Procedure: BIOPSY;  Surgeon: Eartha Angelia Sieving, MD;  [facility];  Service: Gastroenterology;;   BIOPSY  12/14/2022   Procedure: BIOPSY;  Surgeon: Eartha Angelia Sieving, MD;  [facility];  Service: Gastroenterology;;   BREAST ENHANCEMENT SURGERY     CERVICAL BIOPSY     precancerous years ago   CHOLECYSTECTOMY  2011   COLONOSCOPY N/A 05/08/2013   Procedure: COLONOSCOPY;  Surgeon: Claudis RAYMOND Rivet, MD;  [facility]: [facility];  Service: Endoscopy;  Laterality: N/A;  200   COLONOSCOPY WITH PROPOFOL  N/A 12/14/2022   Procedure: COLONOSCOPY WITH PROPOFOL ;  Surgeon: Eartha Angelia Sieving, MD;  [facility];  Service: Gastroenterology;  Laterality: N/A;  9:30am;ASA 2   ESOPHAGOGASTRODUODENOSCOPY (EGD) WITH PROPOFOL  N/A 11/08/2022   Procedure: ESOPHAGOGASTRODUODENOSCOPY (EGD) WITH PROPOFOL ;  Surgeon: Eartha Angelia Sieving, MD;  [facility];  Service: Gastroenterology;  Laterality: N/A;  10:15 AM   HYSTEROSCOPY WITH D & C N/A 10/05/2016   Procedure: HYSTEROSCOPY; UTERINE CURETTAGE;  Surgeon: Vonn VEAR Inch, MD;  Location: [facility];  Service: Gynecology;  Laterality: N/A;   POLYPECTOMY N/A 10/05/2016   Procedure: REMOVAL OF ENDOMETRIAL POLYP;  Surgeon: Vonn VEAR Inch, MD;  Location: [facility];  Service: Gynecology;  Laterality: N/A;   TUBAL LIGATION      Family Psychiatric History: I have reviewed family psychiatric history from progress note on 02/15/2022.  Family History:  Family History  Problem Relation Age of Onset   Dementia Mother    Hyperlipidemia Mother    Thyroid  disease Mother    Hypertension Mother    Schizophrenia Father    Alcohol abuse Father        Schizophrenia   Depression Brother    Mental illness Brother        Schizophrenia (2 brothers); schizophrenia and bipolar (1 brother)    Heart attack Brother    Schizophrenia Brother    Kidney failure Brother    Bipolar disorder Brother    Alcohol abuse Maternal Uncle    Alcohol abuse Paternal Uncle    Alzheimer's disease Maternal Grandfather    Cancer Maternal Grandmother        colon   Diabetes Maternal Grandmother    Heart attack Paternal Grandfather    Heart disease Paternal Grandfather    Diabetes Paternal Grandfather    Mental illness Paternal Grandmother    Polycystic ovary syndrome Daughter    Other Daughter        knee replacement   Thyroid  disease Daughter        had radiation on thyroid     Social History: I have reviewed social history from progress note on 02/15/2022. Social History   Socioeconomic History   Marital status: Married    Spouse name: Not on file   Number of children: Not on file   Years of education: Not on file   Highest education level: Not on file  Occupational History   Not on file  Tobacco Use   Smoking status: Every Day  Current packs/day: 0.25    Average packs/day: 0.3 packs/day for 37.0 years (9.3 ttl pk-yrs)    Types: Cigarettes    Passive exposure: Current   Smokeless tobacco: Never   Tobacco comments:    smokes 5 cig daily- cutting back  Vaping Use   Vaping status: Never Used  Substance and Sexual Activity   Alcohol use: Not Currently    Comment: Quit September 2019   Drug use: No   Sexual activity: Yes    Birth control/protection: Post-menopausal  Other Topics Concern   Not on file  Social History Narrative   Married for 49 years.Retired,used to work at [employer].   Social Drivers of Health   Tobacco Use: High Risk (10/09/2024)   Patient History    Smoking Tobacco Use: Every Day    Smokeless Tobacco Use: Never    Passive Exposure: Current  Financial Resource Strain: Low Risk (09/16/2024)   Overall Financial Resource Strain (CARDIA)    Difficulty of Paying Living Expenses: Not hard at all  Food Insecurity: No Food Insecurity (09/16/2024)    Epic    Worried About Programme Researcher, Broadcasting/film/video in the Last Year: Never true    Ran Out of Food in the Last Year: Never true  Transportation Needs: No Transportation Needs (09/16/2024)   Epic    Lack of Transportation (Medical): No    Lack of Transportation (Non-Medical): No  Physical Activity: Sufficiently Active (09/16/2024)   Exercise Vital Sign    Days of Exercise per Week: 5 days    Minutes of Exercise per Session: 90 min  Stress: No Stress Concern Present (09/16/2024)   [employer] of Occupational Health - Occupational Stress Questionnaire    Feeling of Stress: Only a little  Social Connections: Socially Isolated (09/16/2024)   Social Connection and Isolation Panel    Frequency of Communication with Friends and Family: Never    Frequency of Social Gatherings with Friends and Family: Never    Attends Religious Services: Never    Database Administrator or Organizations: No    Attends Banker Meetings: Never    Marital Status: Married  Depression (PHQ2-9): High Risk (09/16/2024)   Depression (PHQ2-9)    PHQ-2 Score: 14  Alcohol Screen: Not on file  Housing: Low Risk (09/16/2024)   Epic    Unable to Pay for Housing in the Last Year: No    Number of Times Moved in the Last Year: 0    Homeless in the Last Year: No  Utilities: Not At Risk (09/16/2024)   Epic    Threatened with loss of utilities: No  Health Literacy: Adequate Health Literacy (09/16/2024)   B1300 Health Literacy    Frequency of need for help with medical instructions: Never    Allergies: Allergies[1]  Metabolic Disorder Labs: No results found for: HGBA1C, MPG No results found for: PROLACTIN Lab Results  Component Value Date   CHOL 239 (H) 01/21/2021   TRIG 86 01/21/2021   HDL 65 01/21/2021   CHOLHDL 3.7 01/21/2021   LDLCALC 155 (H) 01/21/2021   LDLCALC 134 (H) 05/28/2020   Lab Results  Component Value Date   TSH 1.070 03/07/2022    Therapeutic Level Labs: No results found  for: LITHIUM No results found for: VALPROATE No results found for: CBMZ  Current Medications: Current Outpatient Medications  Medication Sig Dispense Refill   ezetimibe (ZETIA) 10 MG tablet Take 10 mg by mouth. One three times per week  famotidine  (PEPCID ) 20 MG tablet Take 20 mg by mouth. Takes as needed     FLUoxetine  (PROZAC ) 10 MG capsule Take 3 capsules (30 mg total) by mouth as directed. Take 2 tablets daily AM and 1 tablet PM 270 capsule 1   lamoTRIgine  (LAMICTAL ) 25 MG tablet Take 3 tablets (75 mg total) by mouth as directed. Take 1 tablet every morning and 2 tablets in the afternoon. 270 tablet 0   methocarbamol (ROBAXIN) 500 MG tablet Take 1 tablet twice a day by oral route as needed for 90 days.     prazosin  (MINIPRESS ) 2 MG capsule Take 1 capsule (2 mg total) by mouth at bedtime. 90 capsule 0   propranolol  (INDERAL ) 10 MG tablet Take 1 tablet (10 mg total) by mouth daily as needed. For severe anxiety attacks 90 tablet 0   QUEtiapine  (SEROQUEL ) 25 MG tablet Take 1.5 tablets (37.5 mg total) by mouth at bedtime. 135 tablet 3   No current facility-administered medications for this visit.     Musculoskeletal: Strength & Muscle Tone: UTA Gait & Station: Seated Patient leans: N/A  Psychiatric Specialty Exam: Review of Systems  Psychiatric/Behavioral:  The patient is nervous/anxious.     There were no vitals taken for this visit.There is no height or weight on file to calculate BMI.  General Appearance: Casual  Eye Contact:  Fair  Speech:  Normal Rate  Volume:  Normal  Mood:  Anxious improving  Affect:  Full Range  Thought Process:  Goal Directed and Descriptions of Associations: Intact  Orientation:  Full (Time, Place, and Person)  Thought Content: Logical   Suicidal Thoughts:  No  Homicidal Thoughts:  No  Memory:  Immediate;   Fair Recent;   Fair Remote;   Fair  Judgement:  Fair  Insight:  Fair  Psychomotor Activity:  Normal  Concentration:   Concentration: Fair and Attention Span: Fair  Recall:  [employer] of Knowledge: Fair  Language: Fair  Akathisia:  No  Handed:  Right  AIMS (if indicated): not done  Assets:  Manufacturing Systems Engineer Desire for Improvement Housing Social Support Transportation  ADL's:  Intact  Cognition: WNL  Sleep:  improving   Screenings: Geneticist, Molecular Office Visit from 07/03/2024 in [facility] Office Visit from 01/26/2023 in [facility] Office Visit from 05/11/2022 in [facility]  AIMS Total Score 0 0 0   GAD-7    Flowsheet Row Counselor from 09/16/2024 in [facility] Office Visit from 07/03/2024 in [facility] Office Visit from 11/23/2023 in [facility] Video Visit from 03/28/2023 in [facility] Office Visit from 01/26/2023 in [facility]  Total GAD-7 Score 16 7 21 7 1    PHQ2-9    Flowsheet Row Counselor from 09/16/2024 in [facility] [place] Office Visit from 07/03/2024 in [facility] Office Visit from 11/23/2023 in [facility] Video Visit from 05/11/2023 in [facility] Office Visit from 01/26/2023 in [facility]  PHQ-2 Total Score 5 2 2  0 0  PHQ-9 Total Score 14 4 11  -- 3   Flowsheet Row Video Visit from 10/09/2024 in [facility] Counselor from 09/16/2024 in [facility]  at [facility] Video Visit from 09/11/2024 in [facility]  C-SSRS RISK CATEGORY No Risk No Risk No Risk      Assessment and Plan: Morgan Fields is a 74 year old Caucasian female who presented for a follow-up appointment, discussed assessment and plan as noted below.  1. PTSD (post-traumatic stress disorder)-improving Currently reports mood symptoms is improved and nightmares is improving on the higher dosage of Prazosin  and psychotherapy sessions Continue Prazosin  2 mg at bedtime Continue Fluoxetine  30 mg daily  2. GAD (generalized anxiety disorder)-improving Currently reports anxiety symptoms is improved, has been making use of coping strategies. Continue psychotherapy sessions with Ms. Lauraine Pereyra. Continue Fluoxetine  30 mg daily Continue Propranolol  10 mg as needed  3. Bipolar disorder, in full remission, most recent episode mixed Currently reports mood symptoms as stable.  Denies any significant depression or mania Continue Lamotrigine  75 mg daily in divided dosage Continue Seroquel  37.5 mg at bedtime, did not tolerate higher dosages.  4. Tobacco use disorder-unstable Continues to smoke cigarettes Will reevaluate in future sessions.  Follow-up Follow-up in clinic in 3 months or sooner in person.    Collaboration of Care: Collaboration of Care: Referral or follow-up with counselor/therapist AEB I have reviewed notes per Ms. Lauraine Ferrari 10/03/2024, psychotherapist-currently undergoing CBT, strength-based, supportive and reframing with patient to follow-up with primary care provider for repeat labs including TSH, lipid panel, hemoglobin A1c prolactin as needed.  Discussed to sign an ROI to obtain this labs once completed.  Patient/Guardian was advised Release of Information must be obtained prior to any record release in order to collaborate their care with an outside provider. Patient/Guardian was advised if they have not already done so to contact the registration department to sign all necessary forms in order for us  to release information regarding their care.   Consent:  Patient/Guardian gives verbal consent for treatment and assignment of benefits for services provided during this visit. Patient/Guardian expressed understanding and agreed to proceed.   This note was generated in part or whole with voice recognition software. Voice recognition is usually quite accurate but there are transcription errors that can and very often do occur. I apologize for any typographical errors that were not detected and corrected.    Skyanne Welle, MD 10/09/2024, 9:22 AM     [1] No Known Allergies

## 2024-12-05 ENCOUNTER — Ambulatory Visit (HOSPITAL_COMMUNITY)

## 2025-01-13 ENCOUNTER — Ambulatory Visit: Admitting: Psychiatry
